# Patient Record
Sex: Female | Born: 1985 | Race: Black or African American | Hispanic: No | Marital: Married | Smoking: Former smoker
Health system: Southern US, Community
[De-identification: ages and names within clinical notes are randomized; demographics above are authoritative.]

## PROBLEM LIST (undated history)

## (undated) ENCOUNTER — Emergency Department (HOSPITAL_COMMUNITY): Admission: EM | Payer: No Typology Code available for payment source | Source: Home / Self Care

## (undated) DIAGNOSIS — F202 Catatonic schizophrenia: Secondary | ICD-10-CM

## (undated) DIAGNOSIS — R51 Headache: Secondary | ICD-10-CM

## (undated) DIAGNOSIS — F329 Major depressive disorder, single episode, unspecified: Secondary | ICD-10-CM

## (undated) DIAGNOSIS — J302 Other seasonal allergic rhinitis: Secondary | ICD-10-CM

## (undated) DIAGNOSIS — F32A Depression, unspecified: Secondary | ICD-10-CM

## (undated) DIAGNOSIS — L509 Urticaria, unspecified: Secondary | ICD-10-CM

## (undated) HISTORY — DX: Urticaria, unspecified: L50.9

## (undated) HISTORY — DX: Morbid (severe) obesity due to excess calories: E66.01

## (undated) HISTORY — PX: NO PAST SURGERIES: SHX2092

---
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
## 2005-04-29 ENCOUNTER — Emergency Department (HOSPITAL_COMMUNITY): Admission: EM | Admit: 2005-04-29 | Discharge: 2005-04-29 | Payer: Self-pay | Admitting: Emergency Medicine

## 2006-04-22 ENCOUNTER — Ambulatory Visit (HOSPITAL_COMMUNITY): Admission: RE | Admit: 2006-04-22 | Discharge: 2006-04-22 | Payer: Self-pay | Admitting: Family Medicine

## 2006-05-10 ENCOUNTER — Inpatient Hospital Stay (HOSPITAL_COMMUNITY): Admission: AD | Admit: 2006-05-10 | Discharge: 2006-05-10 | Payer: Self-pay | Admitting: Obstetrics

## 2006-08-18 ENCOUNTER — Inpatient Hospital Stay (HOSPITAL_COMMUNITY): Admission: AD | Admit: 2006-08-18 | Discharge: 2006-08-18 | Payer: Self-pay | Admitting: Obstetrics

## 2006-11-08 ENCOUNTER — Inpatient Hospital Stay (HOSPITAL_COMMUNITY): Admission: AD | Admit: 2006-11-08 | Discharge: 2006-11-11 | Payer: Self-pay | Admitting: Obstetrics

## 2006-11-13 ENCOUNTER — Inpatient Hospital Stay (HOSPITAL_COMMUNITY): Admission: AD | Admit: 2006-11-13 | Discharge: 2006-11-13 | Payer: Self-pay | Admitting: Obstetrics

## 2006-12-02 ENCOUNTER — Emergency Department (HOSPITAL_COMMUNITY): Admission: EM | Admit: 2006-12-02 | Discharge: 2006-12-02 | Payer: Self-pay | Admitting: Emergency Medicine

## 2007-06-30 ENCOUNTER — Emergency Department (HOSPITAL_COMMUNITY): Admission: EM | Admit: 2007-06-30 | Discharge: 2007-06-30 | Payer: Self-pay | Admitting: Emergency Medicine

## 2007-07-04 ENCOUNTER — Emergency Department (HOSPITAL_COMMUNITY): Admission: EM | Admit: 2007-07-04 | Discharge: 2007-07-04 | Payer: Self-pay | Admitting: Emergency Medicine

## 2008-08-30 ENCOUNTER — Encounter: Payer: Self-pay | Admitting: Family

## 2008-10-09 ENCOUNTER — Ambulatory Visit: Payer: Self-pay | Admitting: Diagnostic Radiology

## 2008-10-09 ENCOUNTER — Emergency Department (HOSPITAL_BASED_OUTPATIENT_CLINIC_OR_DEPARTMENT_OTHER): Admission: EM | Admit: 2008-10-09 | Discharge: 2008-10-09 | Payer: Self-pay | Admitting: Emergency Medicine

## 2009-04-15 ENCOUNTER — Ambulatory Visit: Payer: Self-pay | Admitting: Diagnostic Radiology

## 2009-04-15 ENCOUNTER — Emergency Department (HOSPITAL_COMMUNITY): Admission: EM | Admit: 2009-04-15 | Discharge: 2009-04-15 | Payer: Self-pay | Admitting: Emergency Medicine

## 2009-04-15 ENCOUNTER — Emergency Department (HOSPITAL_BASED_OUTPATIENT_CLINIC_OR_DEPARTMENT_OTHER): Admission: EM | Admit: 2009-04-15 | Discharge: 2009-04-15 | Payer: Self-pay | Admitting: Emergency Medicine

## 2009-12-28 ENCOUNTER — Emergency Department (HOSPITAL_BASED_OUTPATIENT_CLINIC_OR_DEPARTMENT_OTHER): Admission: EM | Admit: 2009-12-28 | Discharge: 2009-12-29 | Payer: Self-pay | Admitting: Emergency Medicine

## 2009-12-30 ENCOUNTER — Ambulatory Visit: Payer: Self-pay | Admitting: Radiology

## 2009-12-30 ENCOUNTER — Ambulatory Visit (HOSPITAL_BASED_OUTPATIENT_CLINIC_OR_DEPARTMENT_OTHER): Admission: RE | Admit: 2009-12-30 | Discharge: 2009-12-30 | Payer: Self-pay | Admitting: Emergency Medicine

## 2010-04-22 ENCOUNTER — Emergency Department (HOSPITAL_BASED_OUTPATIENT_CLINIC_OR_DEPARTMENT_OTHER)
Admission: EM | Admit: 2010-04-22 | Discharge: 2010-04-22 | Disposition: A | Discharge status: Home / Self Care | Payer: Self-pay | Attending: Emergency Medicine | Admitting: Emergency Medicine

## 2010-04-22 DIAGNOSIS — H538 Other visual disturbances: Secondary | ICD-10-CM | POA: Insufficient documentation

## 2010-04-22 DIAGNOSIS — J45909 Unspecified asthma, uncomplicated: Secondary | ICD-10-CM | POA: Insufficient documentation

## 2010-04-22 DIAGNOSIS — R51 Headache: Secondary | ICD-10-CM | POA: Insufficient documentation

## 2010-04-25 ENCOUNTER — Encounter: Payer: Self-pay | Admitting: Family

## 2010-04-25 ENCOUNTER — Ambulatory Visit (INDEPENDENT_AMBULATORY_CARE_PROVIDER_SITE_OTHER): Payer: Self-pay | Admitting: Family

## 2010-04-25 DIAGNOSIS — Z0189 Encounter for other specified special examinations: Secondary | ICD-10-CM

## 2010-04-25 DIAGNOSIS — G43019 Migraine without aura, intractable, without status migrainosus: Secondary | ICD-10-CM | POA: Insufficient documentation

## 2010-04-30 NOTE — Assessment & Plan Note (Signed)
Summary: new to est went to ed for headaches/mhf--rm 5   Vital Signs:  Patient profile:   25 year old female Height:      64 inches Weight:      210 pounds BMI:     36.18 Temp:     98.2 degrees F oral Pulse rate:   84 / minute Pulse rhythm:   regular Resp:     16 per minute BP sitting:   114 / 70  (right arm) Cuff size:   large  Vitals Entered By: Mervin Kung CMA Duncan Dull) (April 25, 2010 9:54 AM) CC: Pt states she has had a headache x 3 weeks on the left side of her head. Has noticed intermittent  blurred vision and nausea., Hypertension Management Is Patient Diabetic? No Pain Assessment Patient in pain? yes     Location: head Intensity: 5 Type: pressure Comments Has tried OTC headache meds with  minimal relief.   CC:  Pt states she has had a headache x 3 weeks on the left side of her head. Has noticed intermittent  blurred vision and nausea. and Hypertension Management.  History of Present Illness: Ms.  Debra Guerrero is a 25 year old female who presents today with chief complaint of HA on left side of head. Symptoms started 3 weeks ago and wax and wane in intensity. (Was seen in ED 2/14 with same complaint and referred to our office.) Symptoms were severe this AM. Somewhat improved at present (rates current pain 5/10) Symptoms are improved by a dark and quiet room and laying down. Symptoms are associated with nausea.  Vomitted x 1 several weeks ago Denies associated fever of neck pain.  Denies history of same. LMP about 1 month ago.   Had a primary care in [place]- [facility].  Was having gyn care provided there.  She is a full time Consulting civil engineer.    Hypertension History:      Negative major cardiovascular risk factors include female age less than 29 years old and non-tobacco-user status.     Preventive Screening-Counseling & Management  Alcohol-Tobacco     Alcohol drinks/day: 0     Smoking Status: never  Caffeine-Diet-Exercise     Caffeine use/day: 2      Does  Patient Exercise: no      Drug Use:  no.    Allergies (verified): No Known Drug Allergies  Past History:  Past Medical History: Asthma  Past Surgical History: none reported  Family History: mother-- living, alive and well father-- living; type II diabetes (diagnosed in his early 40's) 2 brothers-alive and well 1 sister- alive and well  1 daughter-- 2008 a & w  Social History: Occupation:  Engineer, technical sales, full time Landscape architect at [employer] Married Quit smoking 3 yrs ago Alcohol use-no Drug use-no Regular exercise-no Smoking Status:  never Caffeine use/day:  2  Does Patient Exercise:  no Drug Use:  no  Review of Systems       Constitutional: Denies Fever ENT:  Denies nasal congestion or sore throat. Resp: Denies cough CV:  Denies Chest Pain GI:  denies diarrhea GU: Denies dysuria Lymphatic: Denies lymphadenopathy Musculoskeletal:  Denies muscle/joint pain Skin:  Denies Rashes Psychiatric: Denies depression or anxiety Neuro: Denies numbness     Physical Exam  General:  Well-developed,well-nourished,in no acute distress; alert,appropriate and cooperative throughout examination Head:  Normocephalic and atraumatic without obvious abnormalities. No apparent alopecia or balding. Eyes:  PERRLA, sclear are clear Mouth:  Oral mucosa and oropharynx without  lesions or exudates.  Teeth in good repair. Neck:  No deformities, masses, or tenderness noted. Lungs:  Normal respiratory effort, chest expands symmetrically. Lungs are clear to auscultation, no crackles or wheezes. Heart:  Normal rate and regular rhythm. S1 and S2 normal without gallop, murmur, click, rub or other extra sounds. Neurologic:  alert & oriented X3, cranial nerves II-XII intact, strength normal in all extremities, and gait normal.   Psych:  Oriented X3 and normally interactive.     Impression & Recommendations:  Problem # 1:  MIGRAINE, COMMON, INTRACTABLE (ICD-346.11) Assessment  New Urine HCG today is negative.  Will treat pt with frova samples provided today: spo26 exp 01/2012 #6.  Pt to call if symptoms worsen, or if they do not improve.  Follow up in 1 month for CPX.  Her updated medication list for this problem includes:    Tylenol Extra Strength 500 Mg Tabs (Acetaminophen) .Marland Kitchen... 2 tablets every 5-6 hours.    Advil 200 Mg Tabs (Ibuprofen) .Marland Kitchen... 2 tablets every 6 hours.    Frova 2.5 Mg Tabs (Frovatriptan succinate) ..... One tablet by mouth at start of headache.  may repeat in 2 hours if ha is not resolved.  do not exceed 2 tabs in 1 day    Aleve 220 Mg Caps (Naproxen sodium) ..... One tablet by mouth every 12 hours as needed for headache.  Complete Medication List: 1)  Tylenol Extra Strength 500 Mg Tabs (Acetaminophen) .... 2 tablets every 5-6 hours. 2)  Advil 200 Mg Tabs (Ibuprofen) .... 2 tablets every 6 hours. 3)  [facility]  .... 2 tablets every 4-5 hours. 4)  Frova 2.5 Mg Tabs (Frovatriptan succinate) .... One tablet by mouth at start of headache.  may repeat in 2 hours if ha is not resolved.  do not exceed 2 tabs in 1 day 5)  Aleve 220 Mg Caps (Naproxen sodium) .... One tablet by mouth every 12 hours as needed for headache.  Hypertension Assessment/Plan:      The patient's hypertensive risk group is category A: No risk factors and no target organ damage.  Today's blood pressure is 114/70.    Patient Instructions: 1)  Please call if symptoms worsen, or if they do not improve. 2)  Please follow up in 1 month fasting for a complete physical with Pap smear. 3)  Please contact [facility] department to discuss coverage [phone]. Prescriptions: FROVA 2.5 MG TABS (FROVATRIPTAN SUCCINATE) one tablet by mouth at start of headache.  May repeat in 2 hours if HA is not resolved.  Do not exceed 2 tabs in 1 day  #6 x 0   Entered and Authorized by:   Lemont Fillers FNP   Signed by:   Lemont Fillers FNP on 04/25/2010   Method used:    Samples Given   RxID:   [id number]    Orders Added: 1)  New Patient Level II [99202]    Current Allergies (reviewed today): No known allergies    Appended Document: new to est went to ed for headaches/mhf--rm 5    Clinical Lists Changes  Orders: Added new Service order of Urine Pregnancy Test  [id number]) - Signed Observations: Added new observation of PREG TST URN: negative (04/25/2010 15:18)      Laboratory Results   Urine Tests      Urine HCG: negative

## 2010-05-02 ENCOUNTER — Encounter: Payer: Self-pay | Admitting: Family

## 2010-05-02 ENCOUNTER — Other Ambulatory Visit (HOSPITAL_COMMUNITY)
Admission: RE | Admit: 2010-05-02 | Discharge: 2010-05-02 | Disposition: A | Discharge status: Home / Self Care | Payer: Self-pay | Source: Ambulatory Visit | Attending: Internal Medicine | Admitting: Internal Medicine

## 2010-05-02 ENCOUNTER — Encounter (INDEPENDENT_AMBULATORY_CARE_PROVIDER_SITE_OTHER): Payer: Self-pay | Admitting: Family

## 2010-05-02 ENCOUNTER — Other Ambulatory Visit: Payer: Self-pay | Admitting: Family

## 2010-05-02 DIAGNOSIS — Z0000 Encounter for general adult medical examination without abnormal findings: Secondary | ICD-10-CM

## 2010-05-02 DIAGNOSIS — Z01419 Encounter for gynecological examination (general) (routine) without abnormal findings: Secondary | ICD-10-CM

## 2010-05-02 DIAGNOSIS — Z23 Encounter for immunization: Secondary | ICD-10-CM

## 2010-05-02 LAB — CONVERTED CEMR LAB: Glucose, Bld: 83 mg/dL (ref 70–99)

## 2010-05-04 ENCOUNTER — Encounter: Payer: Self-pay | Admitting: Family

## 2010-05-06 NOTE — Assessment & Plan Note (Signed)
Summary: CPE PATIENT FASTING/MHF--rm 5   Vital Signs:  Patient profile:   25 year old female Menstrual status:  regular LMP:     04/30/2010 Height:      64 inches Weight:      207 pounds BMI:     35.66 Temp:     97.9 degrees F oral Pulse rate:   78 / minute Pulse rhythm:   regular Resp:     16 per minute BP sitting:   102 / 70  (right arm) Cuff size:   large  Vitals Entered By: Mervin Kung CMA Duncan Dull) (May 02, 2010 9:01 AM) CC: Pt here for physical, fasting. Is Patient Diabetic? No Pain Assessment Patient in pain? no      LMP (date): 04/30/2010     Menstrual Status regular Enter LMP: 04/30/2010   CC:  Pt here for physical and fasting.Marland Kitchen  History of Present Illness: Preventative-   Exercise-  walks at school.  Diet is "up and down."  Occasional junk food.  Trying to eat more fruits.  Trying to renew her medicaid.  Last pap was 2009- reports that they have always been normal.  Migraine- noted good improvement with Frova.  Last migraine was on Migraine.  Preventive Screening-Counseling & Management  Alcohol-Tobacco     Alcohol drinks/day: 0     Smoking Status: never  Caffeine-Diet-Exercise     Caffeine use/day: 2      Does Patient Exercise: no  Allergies (verified): No Known Drug Allergies  Past History:  Past Medical History: Last updated: 04/25/2010 Asthma  Past Surgical History: Last updated: 04/25/2010 none reported  Family History: Reviewed history from 04/25/2010 and no changes required. mother-- living, alive and well father-- living; type II diabetes (diagnosed in his early 80's) 2 brothers-alive and well 1 sister- alive and well  1 daughter-- 2008 a & w  Social History: Reviewed history from 04/25/2010 and no changes required. 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 Resp: Denies cough CV:  Denies Chest  pain of sob GI:  Denies nausea or vomitting GU: Denies dysuria Lymphatic: Denies lymphadenopathy Musculoskeletal:  Denies muscle/joint pain Skin:  Denies Rashes Psychiatric: Denies depression or anxiety Neuro: Denies numbness     Physical Exam  General:  Overweight AA female, awake, alert and in NAD Head:  Normocephalic and atraumatic without obvious abnormalities. No apparent alopecia or balding. Eyes:  PERRLA, sclera clear Ears:  External ear exam shows no significant lesions or deformities.  Otoscopic examination reveals clear canals, tympanic membranes are intact bilaterally without bulging, retraction, inflammation or discharge. Hearing is grossly normal bilaterally. Nose:  External nasal examination shows no deformity or inflammation. Nasal mucosa are pink and moist without lesions or exudates. Mouth:  Oral mucosa and oropharynx without lesions or exudates.   Neck:  No deformities, masses, or tenderness noted. Chest Wall:  No deformities, masses, or tenderness noted. Breasts:  No mass, nodules, thickening, tenderness, bulging, retraction, inflamation, nipple discharge or skin changes noted.   Lungs:  Normal respiratory effort, chest expands symmetrically. Lungs are clear to auscultation, no crackles or wheezes. Heart:  Normal rate and regular rhythm. S1 and S2 normal without gallop, murmur, click, rub or other extra sounds. Abdomen:  Bowel sounds positive,abdomen soft and non-tender without masses, organomegaly or hernias  noted. Genitalia:  Pelvic Exam:        External: normal female genitalia without lesions or masses        Vagina: normal without lesions or masses        Cervix: normal without lesions or masses, anterior cervix- difficult pap        Adnexa: normal bimanual exam without masses or fullness        Uterus: normal by palpation        Pap smear: performed Msk:  No  deformity or scoliosis noted of thoracic or lumbar spine.   Pulses:  R and L carotid,radial,dorsalis pedis and posterior tibial pulses are full and equal bilaterally Extremities:  No clubbing, cyanosis, edema, or deformity noted with normal full range of motion of all joints.   Neurologic:  No cranial nerve deficits noted. Station and gait are normal. Plantar reflexes are down-going bilaterally. DTRs are symmetrical throughout. Sensory, motor and coordinative functions appear intact.alert & oriented X3, cranial nerves II-XII intact, and DTRs symmetrical and normal.   Skin:  Intact without suspicious lesions or rashes Cervical Nodes:  No lymphadenopathy noted Axillary Nodes:  No palpable lymphadenopathy Psych:  Cognition and judgment appear intact. Alert and cooperative with normal attention span and concentration. No apparent delusions, illusions, hallucinations   Impression & Recommendations:  Problem # 1:  Preventive Health Care (ICD-V70.0) Assessment Comment Only Patient was counseled on exercise, diet, and weight loss.  Pap perfomed today.  Tetanus today.  Will check fasting glucose due to family hx.  Pt wishes to defer further lab testing at this time pending medicaid cvg.   Complete Medication List: 1)  Frova 2.5 Mg Tabs (Frovatriptan succinate) .... One tablet by mouth at start of headache.  may repeat in 2 hours if ha is not resolved.  do not exceed 2 tabs in 1 day  Other Orders: Specimen Handling (96045) Pap Smear, Thin Prep ( Collection of) (W0981) T-Glucose, Blood ([id number])  Patient Instructions: 1)  We will contact you with the results of your Pap smear and sugar. 2)  Try to avoid juices and sodas. 3)  It is important that you exercise regularly at least 20 minutes 5 times a week. If you develop chest pain, have severe difficulty breathing, or feel very tired , stop exercising immediately and seek medical attention. 4)  Please complete your lab work on the first floor  today. 5)  Follow up in 1 year, sooner if problems or concerns.   Orders Added: 1)  Specimen Handling [99000] 2)  Pap Smear, Thin Prep ( Collection of) [Q0091] 3)  T-Glucose, Blood [[id number]] 4)  Est. Patient Level II [69629]    Current Allergies (reviewed today): No known allergies    Preventive Care Screening     Pt doesn't remember last tetanus. Nicki Guadalajara Fergerson CMA (AAMA)  May 02, 2010 9:08 AM     Appended Document: CPE PATIENT FASTING/MHF--rm 5    Clinical Lists Changes  Orders: Added new Service order of Tdap => 42yrs IM (52841) - Signed Added new Service order of Admin 1st Vaccine (32440) - Signed Observations: Added new observation of TD BOOST VIS: 01/25/08 version given May 02, 2010. (05/02/2010 17:13) Added new observation of TD BOOSTERLO: [id number] (05/02/2010 17:13) Added new observation of TD BOOST EXP: 12/27/2011 (05/02/2010 17:13) Added new observation of TD BOOSTERBY: Tricia Fergerson CMA (AAMA) (05/02/2010 17:13) Added new observation of TD BOOSTERRT: IM (05/02/2010 17:13) Added new observation of TDBOOSTERDSE: 0.5 ml (05/02/2010 17:13) Added new observation of  TD BOOSTERMF: GlaxoSmithKline (05/02/2010 17:13) Added new observation of TD BOOST SIT: right deltoid (05/02/2010 17:13) Added new observation of TD BOOSTER: Tdap (05/02/2010 17:13)       Immunizations Administered:  Tetanus Vaccine:    Vaccine Type: Tdap    Site: right deltoid    Mfr: GlaxoSmithKline    Dose: 0.5 ml    Route: IM    Given by: Mervin Kung CMA (AAMA)    Exp. Date: 12/27/2011    Lot #: [id number]    VIS given: 01/25/08 version given May 02, 2010.

## 2010-05-07 ENCOUNTER — Encounter: Payer: Self-pay | Admitting: Family

## 2010-05-13 ENCOUNTER — Telehealth: Payer: Self-pay | Admitting: Family

## 2010-05-15 NOTE — Assessment & Plan Note (Signed)
   [place] at [facility] [address] [place], [place]  [address]  [place] Phone: [phone]      May 04, 2010   [facility] [address] [place], [place] [address]  RE:  LAB RESULTS  Dear  Ms. [name],  The following is an interpretation of your most recent lab tests.  Please take note of any instructions provided or changes to medications that have resulted from your lab work. Your sugar is normal.   Sincerely Yours,    Lemont Fillers FNP  Appended Document:  Mailed.

## 2010-05-15 NOTE — Letter (Signed)
   [place] at [facility] [address] [place], [place]  [address]  [place] Phone: [phone]      May 07, 2010   [facility] [address] [place], [place] [address]  RE:  LAB RESULTS  Dear  Ms. [name],  The following is an interpretation of your most recent lab tests.  Please take note of any instructions provided or changes to medications that have resulted from your lab work.  Pap Smear: normal - follow up in 1 year   Sincerely Yours,    Lemont Fillers FNP  Appended Document:  Your sugar was also normal.  Appended Document:  mailed

## 2010-05-20 NOTE — Progress Notes (Signed)
Summary: records received  Phone Note Other Incoming   Summary of Call: Records received from [facility] and forwarded to Provider for review. Nicki Guadalajara Fergerson CMA Duncan Dull)  May 13, 2010 2:48 PM

## 2010-05-21 LAB — DIFFERENTIAL
Basophils Absolute: 0.1 10*3/uL (ref 0.0–0.1)
Basophils Relative: 1 % (ref 0–1)
Eosinophils Absolute: 0.3 10*3/uL (ref 0.0–0.7)
Eosinophils Relative: 2 % (ref 0–5)
Lymphocytes Relative: 28 % (ref 12–46)
Lymphs Abs: 2.9 10*3/uL (ref 0.7–4.0)
Monocytes Absolute: 0.7 10*3/uL (ref 0.1–1.0)
Monocytes Relative: 7 % (ref 3–12)
Neutro Abs: 6.7 10*3/uL (ref 1.7–7.7)
Neutrophils Relative %: 63 % (ref 43–77)

## 2010-05-21 LAB — COMPREHENSIVE METABOLIC PANEL
ALT: 11 U/L (ref 0–35)
AST: 21 U/L (ref 0–37)
Albumin: 3.8 g/dL (ref 3.5–5.2)
Alkaline Phosphatase: 90 U/L (ref 39–117)
GFR calc Af Amer: >60 mL/min (ref >60)
Glucose, Bld: 109 mg/dL — ABNORMAL HIGH (ref 70–99)
Potassium: 3.9 mEq/L (ref 3.5–5.1)
Sodium: 143 mEq/L (ref 135–145)
Total Protein: 6.9 g/dL (ref 6.0–8.3)

## 2010-05-21 LAB — CBC
HCT: 37.1 % (ref 36.0–46.0)
Hemoglobin: 12.1 g/dL (ref 12.0–15.0)
MCH: 28.2 pg (ref 26.0–34.0)
MCHC: 32.7 g/dL (ref 30.0–36.0)
MCV: 86.2 fL (ref 78.0–100.0)
Platelets: 237 10*3/uL (ref 150–400)
RBC: 4.31 MIL/uL (ref 3.87–5.11)
RDW: 12.2 % (ref 11.5–15.5)
WBC: 10.7 10*3/uL — ABNORMAL HIGH (ref 4.0–10.5)

## 2010-05-21 LAB — URINALYSIS, ROUTINE W REFLEX MICROSCOPIC
Bilirubin Urine: NEGATIVE
Glucose, UA: NEGATIVE mg/dL
Hgb urine dipstick: NEGATIVE
Ketones, ur: NEGATIVE mg/dL
Nitrite: NEGATIVE
Protein, ur: NEGATIVE mg/dL
Specific Gravity, Urine: 1.006 (ref 1.005–1.030)
Urobilinogen, UA: 0.2 mg/dL (ref 0.0–1.0)
pH: 7 (ref 5.0–8.0)

## 2010-05-21 LAB — PREGNANCY, URINE: Preg Test, Ur: NEGATIVE

## 2010-05-27 NOTE — Letter (Signed)
Summary: Records from [facility] 2009 - 2010  Records from [facility] 2009 - 2010   Imported By: Maryln Gottron 05/22/2010 15:07:02  _____________________________________________________________________  External Attachment:    Type:   Image     Comment:   External Document

## 2010-06-16 ENCOUNTER — Emergency Department (HOSPITAL_BASED_OUTPATIENT_CLINIC_OR_DEPARTMENT_OTHER)
Admission: EM | Admit: 2010-06-16 | Discharge: 2010-06-16 | Disposition: A | Discharge status: Home / Self Care | Payer: Self-pay | Attending: Emergency Medicine | Admitting: Emergency Medicine

## 2010-06-16 DIAGNOSIS — R0989 Other specified symptoms and signs involving the circulatory and respiratory systems: Secondary | ICD-10-CM | POA: Insufficient documentation

## 2010-06-16 DIAGNOSIS — R0609 Other forms of dyspnea: Secondary | ICD-10-CM | POA: Insufficient documentation

## 2010-06-16 DIAGNOSIS — J45909 Unspecified asthma, uncomplicated: Secondary | ICD-10-CM | POA: Insufficient documentation

## 2010-12-19 LAB — CBC
HCT: 32.6 — ABNORMAL LOW
HCT: 37.2
Hemoglobin: 12.2
MCHC: 32.8
MCHC: 34.5
MCV: 87.5
Platelets: 199
RBC: 4.29
RDW: 13.5

## 2010-12-19 LAB — CCBB MATERNAL DONOR DRAW

## 2010-12-19 LAB — RH IMMUNE GLOB WKUP(>/=20WKS)(NOT WOMEN'S HOSP)

## 2010-12-25 LAB — RH IMMUNE GLOBULIN WORKUP (NOT WOMEN'S HOSP)
ABO/RH(D): B NEG
Antibody Screen: NEGATIVE

## 2011-01-15 ENCOUNTER — Emergency Department (HOSPITAL_BASED_OUTPATIENT_CLINIC_OR_DEPARTMENT_OTHER)
Admission: EM | Admit: 2011-01-15 | Discharge: 2011-01-15 | Disposition: A | Discharge status: Home / Self Care | Payer: Self-pay | Attending: Emergency Medicine | Admitting: Emergency Medicine

## 2011-01-15 ENCOUNTER — Encounter: Payer: Self-pay | Admitting: Family Medicine

## 2011-01-15 ENCOUNTER — Emergency Department (INDEPENDENT_AMBULATORY_CARE_PROVIDER_SITE_OTHER): Payer: Self-pay

## 2011-01-15 DIAGNOSIS — J45909 Unspecified asthma, uncomplicated: Secondary | ICD-10-CM | POA: Insufficient documentation

## 2011-01-15 DIAGNOSIS — J329 Chronic sinusitis, unspecified: Secondary | ICD-10-CM | POA: Insufficient documentation

## 2011-01-15 DIAGNOSIS — R05 Cough: Secondary | ICD-10-CM

## 2011-01-15 DIAGNOSIS — R059 Cough, unspecified: Secondary | ICD-10-CM

## 2011-01-15 MED ORDER — AMOXICILLIN 500 MG PO CAPS: 500.0000 mg | ORAL_CAPSULE | Freq: Three times a day (TID) | ORAL | Status: AC

## 2011-01-15 NOTE — ED Notes (Signed)
Pt c/o "sinus pain and pressure" and cough x 1 day. Pt has h/o asthma and used neb at home with relief.

## 2011-01-15 NOTE — ED Provider Notes (Signed)
History     CSN: [id number] Arrival date & time: 01/15/2011  9:53 AM   First MD Initiated Contact with Patient 01/15/11 [phone]      Chief Complaint  Patient presents with  . Nasal Congestion    (Consider location/radiation/quality/duration/timing/severity/associated sxs/prior treatment) HPI Comments: History of asthma presenting with cough, congestion, sinus pressure for the past 2 days. Asthma is usually well-controlled and she has not had any wheezing or shortness of breath or chest pain. She's never been hospitalized for asthma. She reports rhinorrhea, cough productive of green mucous, pressure in her face and burning in her throat. She states the burning in her throat developed after using albuterol nebulizer at home. She denies any fever, nausea, vomiting, abdominal pain, chest pain or shortness of breath. She denies any sick contacts. She does not smoke.  The history is provided by the patient.    Past Medical History  Diagnosis Date  . Asthma     History reviewed. No pertinent past surgical history.  No family history on file.  History  Substance Use Topics  . Smoking status: Never Smoker   . Smokeless tobacco: Not on file  . Alcohol Use: No    OB History    Grav Para Term Preterm Abortions TAB SAB Ect Mult Living                  Review of Systems  All other systems reviewed and are negative.    Allergies  Review of patient's allergies indicates no known allergies.  Home Medications   Current Outpatient Rx  Name Route Sig Dispense Refill  . ALBUTEROL SULFATE (2.5 MG/3ML) 0.083% IN NEBU Nebulization Take 2.5 mg by nebulization every 6 (six) hours as needed.      . AMOXICILLIN 500 MG PO CAPS Oral Take 1 capsule (500 mg total) by mouth 3 (three) times daily. 30 capsule 0    BP 128/74  Pulse 79  Temp(Src) 98.2 F (36.8 C) (Oral)  Resp 18  Ht 5\' 4"  (1.626 m)  Wt 198 lb (89.812 kg)  BMI 33.99 kg/m2  SpO2 99%  LMP 12/07/2010  Physical Exam    Constitutional: She is oriented to person, place, and time. She appears well-developed and well-nourished. No distress.  HENT:  Head: Normocephalic and atraumatic.  Right Ear: External ear normal.  Left Ear: External ear normal.  Mouth/Throat: Oropharynx is clear and moist. No oropharyngeal exudate.       Bilateral maxillary sinus pain  Eyes: Conjunctivae are normal. Pupils are equal, round, and reactive to light.  Neck: Normal range of motion. Neck supple.       No meningismus  Cardiovascular: Normal rate, regular rhythm and normal heart sounds.   Pulmonary/Chest: Breath sounds normal. No respiratory distress. She has no wheezes.  Abdominal: Soft. There is no tenderness. There is no rebound and no guarding.  Musculoskeletal: Normal range of motion. She exhibits no edema and no tenderness.  Neurological: She is alert and oriented to person, place, and time. No cranial nerve deficit.  Skin: Skin is warm.    ED Course  Procedures (including critical care time)   Labs Reviewed  RAPID STREP SCREEN   Dg Chest 2 View  01/15/2011  *RADIOLOGY REPORT*  Clinical Data: 25 year old with cough.  CHEST - 2 VIEW  Comparison: 04/15/2009  Findings: Two views of the chest demonstrate clear lungs. Heart and mediastinum are within normal limits.  The trachea is midline. Bony structures are intact.  IMPRESSION: Normal chest examination.  Original Report Authenticated By: Richarda Overlie, M.D.     1. Sinusitis       MDM  Cough, congestion, sinus pressure and pain. Nontoxic appearance and afebrile.  Chest x-ray negative for infiltrate. We'll treat empirically for sinusitis. Over-the-counter decongestants, antipyretics, follow up with primary doctor.       Glynn Octave, MD 01/15/11 1308

## 2011-04-07 ENCOUNTER — Encounter (HOSPITAL_BASED_OUTPATIENT_CLINIC_OR_DEPARTMENT_OTHER): Payer: Self-pay | Admitting: *Deleted

## 2011-04-07 ENCOUNTER — Emergency Department (HOSPITAL_BASED_OUTPATIENT_CLINIC_OR_DEPARTMENT_OTHER)
Admission: EM | Admit: 2011-04-07 | Discharge: 2011-04-07 | Disposition: A | Discharge status: Home / Self Care | Payer: No Typology Code available for payment source | Attending: Emergency Medicine | Admitting: Emergency Medicine

## 2011-04-07 ENCOUNTER — Emergency Department (INDEPENDENT_AMBULATORY_CARE_PROVIDER_SITE_OTHER): Payer: No Typology Code available for payment source

## 2011-04-07 DIAGNOSIS — M549 Dorsalgia, unspecified: Secondary | ICD-10-CM

## 2011-04-07 DIAGNOSIS — S39012A Strain of muscle, fascia and tendon of lower back, initial encounter: Secondary | ICD-10-CM

## 2011-04-07 DIAGNOSIS — S8000XA Contusion of unspecified knee, initial encounter: Secondary | ICD-10-CM | POA: Insufficient documentation

## 2011-04-07 DIAGNOSIS — M25569 Pain in unspecified knee: Secondary | ICD-10-CM

## 2011-04-07 DIAGNOSIS — IMO0002 Reserved for concepts with insufficient information to code with codable children: Secondary | ICD-10-CM | POA: Insufficient documentation

## 2011-04-07 DIAGNOSIS — Y92410 Unspecified street and highway as the place of occurrence of the external cause: Secondary | ICD-10-CM | POA: Insufficient documentation

## 2011-04-07 MED ORDER — CYCLOBENZAPRINE HCL 10 MG PO TABS: 10.0000 mg | ORAL_TABLET | Freq: Two times a day (BID) | ORAL | Status: AC | PRN

## 2011-04-07 MED ORDER — HYDROCODONE-ACETAMINOPHEN 5-325 MG PO TABS: 2.0000 | ORAL_TABLET | ORAL | Status: AC | PRN
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
## 2011-08-24 ENCOUNTER — Encounter (HOSPITAL_BASED_OUTPATIENT_CLINIC_OR_DEPARTMENT_OTHER): Payer: Self-pay | Admitting: Family Medicine

## 2011-08-24 ENCOUNTER — Emergency Department (HOSPITAL_BASED_OUTPATIENT_CLINIC_OR_DEPARTMENT_OTHER)
Admission: EM | Admit: 2011-08-24 | Discharge: 2011-08-24 | Disposition: A | Discharge status: Home / Self Care | Payer: Medicaid Other | Attending: Emergency Medicine | Admitting: Emergency Medicine

## 2011-08-24 DIAGNOSIS — G43909 Migraine, unspecified, not intractable, without status migrainosus: Secondary | ICD-10-CM

## 2011-08-24 DIAGNOSIS — J45909 Unspecified asthma, uncomplicated: Secondary | ICD-10-CM | POA: Insufficient documentation

## 2011-08-24 DIAGNOSIS — Z79899 Other long term (current) drug therapy: Secondary | ICD-10-CM | POA: Insufficient documentation

## 2011-08-24 HISTORY — DX: Headache: R51

## 2011-08-24 MED ORDER — METOCLOPRAMIDE HCL 5 MG/ML IJ SOLN
10.0000 mg | Freq: Once | INTRAMUSCULAR | Status: AC
  Administered 2011-08-24: 10 mg via INTRAVENOUS
  Filled 2011-08-24: qty 2

## 2011-08-24 MED ORDER — SODIUM CHLORIDE 0.9 % IV BOLUS (SEPSIS)
1000.0000 mL | Freq: Once | INTRAVENOUS | Status: AC
  Administered 2011-08-24: 1000 mL via INTRAVENOUS

## 2011-08-24 MED ORDER — DIPHENHYDRAMINE HCL 50 MG/ML IJ SOLN
12.5000 mg | Freq: Once | INTRAMUSCULAR | Status: AC
  Administered 2011-08-24: 12.5 mg via INTRAVENOUS
  Filled 2011-08-24: qty 1

## 2011-08-24 MED ORDER — DEXAMETHASONE SODIUM PHOSPHATE 10 MG/ML IJ SOLN
10.0000 mg | Freq: Once | INTRAMUSCULAR | Status: AC
  Administered 2011-08-24: 10 mg via INTRAVENOUS
  Filled 2011-08-24: qty 1

## 2011-08-24 NOTE — ED Notes (Addendum)
Pt c/o right sided headache with right jaw pain and right lateral neck pain x 1 wk. Pt reports h/o same but is not currently on meds. Pt has seen Sandford Craze in past for H/A. Pt sts she has taken multiple otc meds without success. Pt drove self to ED.

## 2011-08-24 NOTE — ED Provider Notes (Signed)
History     CSN: [id number]  Arrival date & time 08/24/11  4696   First MD Initiated Contact with Patient 08/24/11 [phone]      Chief Complaint  Patient presents with  . Headache  . Jaw Pain    (Consider location/radiation/quality/duration/timing/severity/associated sxs/prior treatment) HPI Patient is a 26 year old female who presents today complaining of bilateral frontal headache for the past 7 days. This radiates to her jaw. Patient endorses associated photophobia as well as nausea. She denies fevers, vomiting, rash, or neck pain. Patient has history of prior migraines and reports that this feels similarly. She denies any other symptoms today. Pain is rated as a 10 out of 10 and sharp. She has tried over-the-counter medications without any relief. There are no other associated or modifying factors. Past Medical History  Diagnosis Date  . Asthma   . Headache     History reviewed. No pertinent past surgical history.  History reviewed. No pertinent family history.  History  Substance Use Topics  . Smoking status: Never Smoker   . Smokeless tobacco: Not on file  . Alcohol Use: No    OB History    Grav Para Term Preterm Abortions TAB SAB Ect Mult Living                  Review of Systems  Constitutional: Negative.   Eyes: Positive for photophobia.  Respiratory: Negative.   Cardiovascular: Negative.   Gastrointestinal: Positive for nausea.  Genitourinary: Negative.   Musculoskeletal: Negative.   Neurological: Positive for headaches.  Hematological: Negative.   Psychiatric/Behavioral: Negative.   All other systems reviewed and are negative.    Allergies  Review of patient's allergies indicates no known allergies.  Home Medications   Current Outpatient Rx  Name Route Sig Dispense Refill  . ALBUTEROL SULFATE (2.5 MG/3ML) 0.083% IN NEBU Nebulization Take 2.5 mg by nebulization every 6 (six) hours as needed.        BP 120/71  Pulse 78  Temp 97.7 F (36.5 C)  (Oral)  Resp 18  Ht 5\' 4"  (1.626 m)  Wt 197 lb 6 oz (89.529 kg)  BMI 33.88 kg/m2  SpO2 100%  LMP 08/17/2011  Physical Exam  Nursing note and vitals reviewed. GEN: Well-developed, well-nourished female in no distress HEENT: Atraumatic, normocephalic. Oropharynx clear without erythema EYES: PERRLA BL, no scleral icterus. NECK: Trachea midline, no meningismus CV: regular rate and rhythm. No murmurs, rubs, or gallops PULM: No respiratory distress.  No crackles, wheezes, or rales. GI: soft, non-tender. No guarding, rebound, or tenderness. + bowel sounds  GU: deferred Neuro: cranial nerves grossly 2-12 intact, no abnormalities of strength or sensation, A and O x 3 MSK: Patient moves all 4 extremities symmetrically, no deformity, edema, or injury noted Skin: No rashes petechiae, purpura, or jaundice Psych: no abnormality of mood   ED Course  Procedures (including critical care time)  Labs Reviewed - No data to display No results found.   1. Migraine       MDM  Patient was evaluated by myself. She was having her typical migraine. She had no history of trauma, neck pain, rashes, fever, or other symptoms to indicate other intracranial pathology such as subarachnoid hemorrhage or mass. Patient did not have presentation consistent with meningitis. Patient was treated as a migraine with Reglan and Benadryl. Patient also received IV fluids her symptoms completely resolved. She was given a dose of Decadron 10 mg IV to prevent recurrence. Following this patient was feeling completely better  and was discharged home in good condition.        Cyndra Numbers, MD 08/24/11 1059

## 2011-09-09 ENCOUNTER — Emergency Department (HOSPITAL_COMMUNITY): Payer: Medicaid Other

## 2011-09-09 ENCOUNTER — Encounter (HOSPITAL_COMMUNITY): Payer: Self-pay | Admitting: Emergency Medicine

## 2011-09-09 ENCOUNTER — Emergency Department (HOSPITAL_COMMUNITY)
Admission: EM | Admit: 2011-09-09 | Discharge: 2011-09-10 | Disposition: A | Discharge status: Other Acute Inpatient Hospital | Payer: Medicaid Other | Attending: Emergency Medicine | Admitting: Emergency Medicine

## 2011-09-09 DIAGNOSIS — R45851 Suicidal ideations: Secondary | ICD-10-CM | POA: Insufficient documentation

## 2011-09-09 DIAGNOSIS — Z87891 Personal history of nicotine dependence: Secondary | ICD-10-CM | POA: Insufficient documentation

## 2011-09-09 DIAGNOSIS — J45909 Unspecified asthma, uncomplicated: Secondary | ICD-10-CM | POA: Insufficient documentation

## 2011-09-09 DIAGNOSIS — F32A Depression, unspecified: Secondary | ICD-10-CM

## 2011-09-09 DIAGNOSIS — R51 Headache: Secondary | ICD-10-CM | POA: Insufficient documentation

## 2011-09-09 DIAGNOSIS — Z79899 Other long term (current) drug therapy: Secondary | ICD-10-CM | POA: Insufficient documentation

## 2011-09-09 DIAGNOSIS — F329 Major depressive disorder, single episode, unspecified: Secondary | ICD-10-CM | POA: Insufficient documentation

## 2011-09-09 DIAGNOSIS — F3289 Other specified depressive episodes: Secondary | ICD-10-CM | POA: Insufficient documentation

## 2011-09-09 HISTORY — DX: Depression, unspecified: F32.A

## 2011-09-09 HISTORY — DX: Major depressive disorder, single episode, unspecified: F32.9

## 2011-09-09 LAB — COMPREHENSIVE METABOLIC PANEL
ALT: 15 U/L (ref 0–35)
CO2: 23 mEq/L (ref 19–32)
Calcium: 9.8 mg/dL (ref 8.4–10.5)
Creatinine, Ser: 0.59 mg/dL (ref 0.50–1.10)
GFR calc Af Amer: >90 mL/min (ref >90)
GFR calc non Af Amer: >90 mL/min (ref >90)
Glucose, Bld: 91 mg/dL (ref 70–99)
Total Bilirubin: 0.5 mg/dL (ref 0.3–1.2)

## 2011-09-09 LAB — CBC
Hemoglobin: 15 g/dL (ref 12.0–15.0)
MCH: 28.6 pg (ref 26.0–34.0)
MCV: 83.8 fL (ref 78.0–100.0)
RBC: 5.25 MIL/uL — ABNORMAL HIGH (ref 3.87–5.11)

## 2011-09-09 LAB — RAPID URINE DRUG SCREEN, HOSP PERFORMED
Barbiturates: NOT DETECTED
Cocaine: NOT DETECTED
Opiates: NOT DETECTED

## 2011-09-09 MED ORDER — NICOTINE 21 MG/24HR TD PT24: 21.0000 mg | MEDICATED_PATCH | Freq: Every day | TRANSDERMAL | Status: DC

## 2011-09-09 MED ORDER — ACETAMINOPHEN 325 MG PO TABS: 650.0000 mg | ORAL_TABLET | ORAL | Status: DC | PRN

## 2011-09-09 MED ORDER — ALUM & MAG HYDROXIDE-SIMETH 200-200-20 MG/5ML PO SUSP: 30.0000 mL | ORAL | Status: DC | PRN

## 2011-09-09 MED ORDER — IBUPROFEN 600 MG PO TABS: 600.0000 mg | ORAL_TABLET | Freq: Three times a day (TID) | ORAL | Status: DC | PRN

## 2011-09-09 MED ORDER — ZOLPIDEM TARTRATE 5 MG PO TABS: 5.0000 mg | ORAL_TABLET | Freq: Every evening | ORAL | Status: DC | PRN

## 2011-09-09 MED ORDER — ONDANSETRON HCL 4 MG PO TABS: 4.0000 mg | ORAL_TABLET | Freq: Three times a day (TID) | ORAL | Status: DC | PRN

## 2011-09-09 NOTE — ED Provider Notes (Signed)
History     CSN: [id number]  Arrival date & time 09/09/11  1207   First MD Initiated Contact with Patient 09/09/11 1209      Chief Complaint  Patient presents with  . Depression    (Consider location/radiation/quality/duration/timing/severity/associated sxs/prior treatment) HPI  Pt Status: Pt is IVC'd  Patient to the ER with complaints of depression. The depression started when she developed headaches her software of college. The patient and her mother both deny an event that caused the acute start of her headaches and depression. The patient has a lot affect and answers most of my questions as I don't know. The patient has started not to cares much about her hygiene. She started to withdraw from school. The patient's mother states that the patient has expressed some suicidal ideation without plan. The patient does not answer whether she is suicidal or not. The counselor that she sees a [employer] [employer] recommends the patient had a head CT to rule out medical reasons for depression. At this time the patient is in no acute distress and is a voluntary.  Past Medical History  Diagnosis Date  . Asthma   . Headache   . Depression     No past surgical history on file.  No family history on file.  History  Substance Use Topics  . Smoking status: Former Games developer  . Smokeless tobacco: Not on file  . Alcohol Use: No    OB History    Grav Para Term Preterm Abortions TAB SAB Ect Mult Living                  Review of Systems   HEENT: denies blurry vision or change in hearing PULMONARY: Denies difficulty breathing and SOB CARDIAC: denies chest pain or heart palpitations MUSCULOSKELETAL:  denies being unable to ambulate ABDOMEN AL: denies abdominal pain GU: denies loss of bowel or urinary control NEURO: denies numbness and tingling in extremities SKIN: no new rashes PSYCH: patient having depression NECK: Pt denies having neck pain     Allergies  Review of patient's allergies  indicates no known allergies.  Home Medications   Current Outpatient Rx  Name Route Sig Dispense Refill  . ALBUTEROL SULFATE HFA 108 (90 BASE) MCG/ACT IN AERS Inhalation Inhale 2 puffs into the lungs every 6 (six) hours as needed. Shortness of breath    . ALBUTEROL SULFATE (2.5 MG/3ML) 0.083% IN NEBU Nebulization Take 2.5 mg by nebulization every 6 (six) hours as needed.        BP 150/113  Pulse 115  Temp 98.2 F (36.8 C) (Oral)  Resp 20  Ht 5\' 5"  (1.651 m)  Wt 181 lb 12.8 oz (82.464 kg)  BMI 30.25 kg/m2  SpO2 100%  LMP 09/09/2011  Physical Exam  Nursing note and vitals reviewed. Constitutional: She appears well-developed and well-nourished. No distress.  HENT:  Head: Normocephalic and atraumatic.  Eyes: Pupils are equal, round, and reactive to light.  Neck: Normal range of motion. Neck supple.  Cardiovascular: Normal rate and regular rhythm.   Pulmonary/Chest: Effort normal.  Abdominal: Soft.  Neurological: She is alert.  Skin: Skin is warm and dry.  Psychiatric: She expresses no homicidal and no suicidal ideation. She expresses no suicidal plans and no homicidal plans.       Pt has flat affect.    ED Course  Procedures (including critical care time)   Labs Reviewed  CBC  COMPREHENSIVE METABOLIC PANEL  PREGNANCY, URINE  URINE RAPID DRUG SCREEN (HOSP PERFORMED)  ETHANOL   No results found.   1. Depression       MDM  After patient cleared, hopefully she will go to [place] with passive suicidal ideation.  Normal head CT, pt tried to leave ER and she is believed to be a harm to herself. IVC paper filled.        Dorthula Matas, PA 09/09/11 1336

## 2011-09-09 NOTE — BH Assessment (Addendum)
Assessment Note   Debra Guerrero is a 26 y.o. female brought in by family, endorsing SI with no plan. Pt currently denies SI, HI, AHVH, and SA. Pt endorses depressive symptoms including decreased sleep, appetite, and concentration. She also endorses anhedonia, fatigue, and has a flat affect. Pt appears highly suspicious and answered most assessment with questions "no" and "I don't know." Pt currently lives with her family. She states she was in school and work until a "couple of months ago" when she withdrew and quit. She reports no recent stressor. Family reports pt has stopped taking care of hygiene and has been isolating herself. Family reports concern that pt is a danger to herself. Pt has no prior mental health history and is on no medications. She was orginally seen at [facility] who sent pt to [facility] due to pt having a history of migraines. Pt is under IVC petition.   Pt has been seen by telepsychatrist who recommend inpatient treatment at this time.  Axis I: Major Depression, single episode Axis II: Deferred Axis III:  Past Medical History  Diagnosis Date  . Asthma   . Headache   . Depression    Axis IV: other psychosocial or environmental problems Axis V: 21-30 behavior considerably influenced by delusions or hallucinations OR serious impairment in judgment, communication OR inability to function in almost all areas  Past Medical History:  Past Medical History  Diagnosis Date  . Asthma   . Headache   . Depression     No past surgical history on file.  Family History: No family history on file.  Social History:  reports that she has quit smoking. She does not have any smokeless tobacco history on file. She reports that she does not drink alcohol or use illicit drugs.  Additional Social History:  Alcohol / Drug Use History of alcohol / drug use?: No history of alcohol / drug abuse  CIWA: CIWA-Ar BP: 121/87 mmHg Pulse Rate: 107  COWS:    Allergies: No Known  Allergies  Home Medications:  (Not in a hospital admission)  OB/GYN Status:  Patient's last menstrual period was 09/09/2011.  General Assessment Data Location of Assessment: [facility] ED Living Arrangements: Parent Can pt return to current living arrangement?: Yes Admission Status: Involuntary Is patient capable of signing voluntary admission?: Yes Transfer from: Acute Hospital Referral Source: Other Museum/gallery curator)  Education Status Is patient currently in school?: No Highest grade of school patient has completed: 2 years of college  Risk to self Suicidal Ideation: No-Not Currently/Within Last 6 Months Suicidal Intent: No-Not Currently/Within Last 6 Months Is patient at risk for suicide?: Yes Suicidal Plan?: No Access to Means: No What has been your use of drugs/alcohol within the last 12 months?: denies Previous Attempts/Gestures: No How many times?: 0  Other Self Harm Risks: none Triggers for Past Attempts: None known Intentional Self Injurious Behavior: None Family Suicide History: No Recent stressful life event(s):  (none noted) Persecutory voices/beliefs?: No Depression: Yes Depression Symptoms: Despondent;Insomnia;Isolating;Loss of interest in usual pleasures Substance abuse history and/or treatment for substance abuse?: No Suicide prevention information given to non-admitted patients: Not applicable  Risk to Others Homicidal Ideation: No Thoughts of Harm to Others: No Current Homicidal Intent: No Current Homicidal Plan: No Access to Homicidal Means: No Identified Victim: none History of harm to others?: No Assessment of Violence: None Noted Violent Behavior Description: cooperative Does patient have access to weapons?: No Criminal Charges Pending?: No Does patient have a court date: No  Psychosis Hallucinations:  None noted Delusions: None noted  Mental Status Report Appear/Hygiene: Bizarre Eye Contact: Poor Motor Activity: Unremarkable Speech:  Logical/coherent Level of Consciousness: Alert Mood: Suspicious;Depressed;Anxious Affect: Apprehensive;Anxious;Depressed Anxiety Level: Moderate Thought Processes: Relevant;Coherent Judgement: Impaired Orientation: Person;Place;Time;Situation Obsessive Compulsive Thoughts/Behaviors: None  Cognitive Functioning Concentration: Normal Memory: Recent Intact;Remote Intact IQ: Average Insight: Poor Impulse Control: Poor Appetite: Poor Weight Loss: 0  Weight Gain: 0  Sleep: Decreased Vegetative Symptoms: None  ADLScreening [facility] Assessment Services) Patient's cognitive ability adequate to safely complete daily activities?: Yes Patient able to express need for assistance with ADLs?: Yes Independently performs ADLs?: Yes  Abuse/Neglect [facility]) Physical Abuse: Denies Verbal Abuse: Denies Sexual Abuse: Denies  Prior Inpatient Therapy Prior Inpatient Therapy: No Prior Therapy Dates: na Prior Therapy Facilty/Provider(s): na Reason for Treatment: na  Prior Outpatient Therapy Prior Outpatient Therapy: No Prior Therapy Dates: na Prior Therapy Facilty/Provider(s): na Reason for Treatment: na  ADL Screening (condition at time of admission) Patient's cognitive ability adequate to safely complete daily activities?: Yes Patient able to express need for assistance with ADLs?: Yes Independently performs ADLs?: Yes  Home Assistive Devices/Equipment Home Assistive Devices/Equipment: None    Abuse/Neglect Assessment (Assessment to be complete while patient is alone) Physical Abuse: Denies Verbal Abuse: Denies Sexual Abuse: Denies Exploitation of patient/patient's resources: Denies Self-Neglect: Denies Values / Beliefs Cultural Requests During Hospitalization: None Spiritual Requests During Hospitalization: None   Advance Directives (For Healthcare) Advance Directive: Patient does not have advance directive;Patient would not like information Pre-existing out of facility DNR order  (yellow form or pink MOST form): No Nutrition Screen Diet: Regular Unintentional weight loss greater than 10lbs within the last month: No Problems chewing or swallowing foods and/or liquids: No Home Tube Feeding or Total Parenteral Nutrition (TPN): No Patient appears severely malnourished: No Pregnant or Lactating: No  Additional Information 1:1 In Past 12 Months?: No CIRT Risk: No Elopement Risk: No Does patient have medical clearance?: Yes     Disposition:  Disposition Disposition of Patient: Referred to;Inpatient treatment program Type of inpatient treatment program: Adult Pt has been accepted to [facility] by Jorje Guild, PA to Dr. Dan Humphreys, rm [phone]. EDP notified and agrees with plan.  On Site Evaluation by:   Reviewed with Physician:     Georgina Quint A 09/09/2011 10:35 PM

## 2011-09-09 NOTE — ED Provider Notes (Signed)
History     CSN: [id number]  Arrival date & time 09/09/11  1207   First MD Initiated Contact with Patient 09/09/11 1209      Chief Complaint  Patient presents with  . Depression    (Consider location/radiation/quality/duration/timing/severity/associated sxs/prior treatment) HPI  Past Medical History  Diagnosis Date  . Asthma   . Headache   . Depression     No past surgical history on file.  No family history on file.  History  Substance Use Topics  . Smoking status: Former Games developer  . Smokeless tobacco: Not on file  . Alcohol Use: No    OB History    Grav Para Term Preterm Abortions TAB SAB Ect Mult Living                  Review of Systems  Allergies  Review of patient's allergies indicates no known allergies.  Home Medications   Current Outpatient Rx  Name Route Sig Dispense Refill  . ALBUTEROL SULFATE HFA 108 (90 BASE) MCG/ACT IN AERS Inhalation Inhale 2 puffs into the lungs every 6 (six) hours as needed. Shortness of breath    . ALBUTEROL SULFATE (2.5 MG/3ML) 0.083% IN NEBU Nebulization Take 2.5 mg by nebulization every 6 (six) hours as needed.        BP 150/113  Pulse 115  Temp 98.2 F (36.8 C) (Oral)  Resp 20  Ht 5\' 5"  (1.651 m)  Wt 181 lb 12.8 oz (82.464 kg)  BMI 30.25 kg/m2  SpO2 100%  LMP 09/09/2011  Physical Exam  ED Course  Procedures (including critical care time)  Labs Reviewed  CBC - Abnormal; Notable for the following:    WBC 12.9 (*)     RBC 5.25 (*)     All other components within normal limits  COMPREHENSIVE METABOLIC PANEL  ETHANOL  PREGNANCY, URINE  URINE RAPID DRUG SCREEN (HOSP PERFORMED)   Ct Head Wo Contrast  09/09/2011  *RADIOLOGY REPORT*  Clinical Data: Depression and headaches.  CT HEAD WITHOUT CONTRAST  Technique:  Contiguous axial images were obtained from the base of the skull through the vertex without contrast.  Comparison: None.  Findings: No acute intracranial abnormality is present. Specifically, there is  no evidence for acute infarct, hemorrhage, mass, hydrocephalus, or extra-axial fluid collection.  The paranasal sinuses and mastoid air cells are clear.  The globes and orbits are intact.  The osseous skull is intact.  IMPRESSION: Negative CT of the head.  Original Report Authenticated By: Jamesetta Orleans. MATTERN, M.D.     1. Depression       MDM  Medical screening examination/treatment/procedure(s) were conducted as a shared visit with non-physician practitioner(s) and myself.  I personally evaluated the patient during the encounter Pt with depression, si. Appears depressed. Act and telepsych eval.         Suzi Roots, MD 09/09/11 1455

## 2011-09-09 NOTE — ED Notes (Signed)
Patient brought to room 40. Patient immediately walked to doors of the psych ED unit and tried to go through. Patient stood with security by the doors and crossed her arms. Patient was asked by writer to go to her room for assessment. Patient was cooperative. Patient was instructed on telephone calls x 3 and lasting 5 minutes a piece. Patient nodded that she understood. Patient was given ginge rale upon request.

## 2011-09-09 NOTE — ED Notes (Addendum)
Patient refusing to take underwear off. Patient states she is on her menstrual. Patient given mesh panties and pad. Patient taken to restroom. Patient states "Im not doing all this. You people are gross. Im not doing this" Patient then threw items in bag and pushed passed tech. Security at door. Mom with patient. Mesh undies and pad at bedside. RN notified

## 2011-09-09 NOTE — ED Notes (Signed)
Patient aware of need for urine testing. Patient unable to void at this time. Patient encouraged to call for toileting assistance Patient has labeled cup.

## 2011-09-09 NOTE — ED Provider Notes (Signed)
Medical screening examination/treatment/procedure(s) were conducted as a shared visit with non-physician practitioner(s) and myself.  I personally evaluated the patient during the encounter   Suzi Roots, MD 09/09/11 1539

## 2011-09-09 NOTE — ED Notes (Addendum)
Pt stated that depression in college since sophomore. Pt stated that suicidal ideations are recurrent without a plan. Pt stated that her mother made her go to Psychiatry and counseling to get help, because she had an decrease of energy and interest in things. Pt never attempted suicide and is negative for hx of cutting and self inflicting injury. No hx of alcohol and drug abuse.  Affect is flat and depressed. Mother at bedside. Mother stated no family hx of mental health.  Mother stated that pt started having headaches a month ago and shortly after headaches pt withdrew from school and is withdrawn, poor hygiene per mother and not taking care of her daughter.  Counselor at [employer] suggested CT head to mother at hospital to exclude medical reasons for depression

## 2011-09-09 NOTE — ED Notes (Signed)
Pt states she does not know why she is here.  When I asked her if she was suicidal she stated "earlier but not now".

## 2011-09-10 ENCOUNTER — Encounter (HOSPITAL_COMMUNITY): Payer: Self-pay | Admitting: *Deleted

## 2011-09-10 ENCOUNTER — Inpatient Hospital Stay (HOSPITAL_COMMUNITY)
Admission: EM | Admit: 2011-09-10 | Discharge: 2011-09-11 | DRG: 885 | Disposition: A | Discharge status: Other Acute Inpatient Hospital | Payer: Medicaid Other | Source: Ambulatory Visit | Attending: Emergency Medicine | Admitting: Emergency Medicine

## 2011-09-10 DIAGNOSIS — F329 Major depressive disorder, single episode, unspecified: Secondary | ICD-10-CM | POA: Diagnosis present

## 2011-09-10 DIAGNOSIS — I498 Other specified cardiac arrhythmias: Secondary | ICD-10-CM | POA: Diagnosis present

## 2011-09-10 DIAGNOSIS — J45909 Unspecified asthma, uncomplicated: Secondary | ICD-10-CM | POA: Diagnosis present

## 2011-09-10 DIAGNOSIS — F202 Catatonic schizophrenia: Secondary | ICD-10-CM

## 2011-09-10 DIAGNOSIS — G9349 Other encephalopathy: Secondary | ICD-10-CM | POA: Diagnosis present

## 2011-09-10 DIAGNOSIS — E876 Hypokalemia: Secondary | ICD-10-CM | POA: Diagnosis present

## 2011-09-10 DIAGNOSIS — G43019 Migraine without aura, intractable, without status migrainosus: Secondary | ICD-10-CM | POA: Diagnosis present

## 2011-09-10 DIAGNOSIS — Z79899 Other long term (current) drug therapy: Secondary | ICD-10-CM

## 2011-09-10 DIAGNOSIS — G9340 Encephalopathy, unspecified: Secondary | ICD-10-CM | POA: Diagnosis present

## 2011-09-10 DIAGNOSIS — F200 Paranoid schizophrenia: Secondary | ICD-10-CM

## 2011-09-10 DIAGNOSIS — J3089 Other allergic rhinitis: Secondary | ICD-10-CM | POA: Diagnosis present

## 2011-09-10 DIAGNOSIS — R000 Tachycardia, unspecified: Secondary | ICD-10-CM | POA: Diagnosis present

## 2011-09-10 HISTORY — DX: Other seasonal allergic rhinitis: J30.2

## 2011-09-10 HISTORY — DX: Catatonic schizophrenia: F20.2

## 2011-09-10 MED ORDER — MIRTAZAPINE 15 MG PO TBDP
15.0000 mg | ORAL_TABLET | Freq: Every day | ORAL | Status: DC
  Administered 2011-09-10: 15 mg via ORAL
  Filled 2011-09-10 (×2): qty 1

## 2011-09-10 MED ORDER — ALBUTEROL SULFATE (5 MG/ML) 0.5% IN NEBU: 2.5000 mg | INHALATION_SOLUTION | Freq: Four times a day (QID) | RESPIRATORY_TRACT | Status: DC | PRN

## 2011-09-10 MED ORDER — LORAZEPAM 1 MG PO TABS
1.0000 mg | ORAL_TABLET | Freq: Four times a day (QID) | ORAL | Status: DC | PRN
  Administered 2011-09-10: 1 mg via ORAL
  Filled 2011-09-10: qty 1

## 2011-09-10 MED ORDER — ALBUTEROL SULFATE HFA 108 (90 BASE) MCG/ACT IN AERS: 2.0000 | INHALATION_SPRAY | Freq: Four times a day (QID) | RESPIRATORY_TRACT | Status: DC | PRN

## 2011-09-10 MED ORDER — ACETAMINOPHEN 325 MG PO TABS: 650.0000 mg | ORAL_TABLET | Freq: Four times a day (QID) | ORAL | Status: DC | PRN

## 2011-09-10 MED ORDER — MAGNESIUM HYDROXIDE 400 MG/5ML PO SUSP: 30.0000 mL | Freq: Every day | ORAL | Status: DC | PRN

## 2011-09-10 MED ORDER — RISPERIDONE 2 MG PO TBDP
2.0000 mg | ORAL_TABLET | Freq: Every day | ORAL | Status: DC
  Administered 2011-09-10: 2 mg via ORAL
  Filled 2011-09-10 (×3): qty 1

## 2011-09-10 MED ORDER — TRAZODONE HCL 100 MG PO TABS: 100.0000 mg | ORAL_TABLET | Freq: Every evening | ORAL | Status: DC | PRN

## 2011-09-10 MED ORDER — HALOPERIDOL LACTATE 5 MG/ML IJ SOLN: 5.0000 mg | Freq: Four times a day (QID) | INTRAMUSCULAR | Status: DC | PRN

## 2011-09-10 MED ORDER — LORAZEPAM 2 MG/ML IJ SOLN: 1.0000 mg | Freq: Four times a day (QID) | INTRAMUSCULAR | Status: DC | PRN

## 2011-09-10 MED ORDER — ALUM & MAG HYDROXIDE-SIMETH 200-200-20 MG/5ML PO SUSP: 30.0000 mL | ORAL | Status: DC | PRN

## 2011-09-10 MED ORDER — HALOPERIDOL 5 MG PO TABS
5.0000 mg | ORAL_TABLET | Freq: Four times a day (QID) | ORAL | Status: DC | PRN
  Administered 2011-09-10: 5 mg via ORAL
  Filled 2011-09-10: qty 1

## 2011-09-10 NOTE — Tx Team (Addendum)
Initial Interdisciplinary Treatment Plan  PATIENT STRENGTHS: (choose at least two) Average or above average intelligence Communication skills General fund of knowledge Work skills  PATIENT STRESSORS: Occupational concerns   PROBLEM LIST: Problem List/Patient Goals Date to be addressed Date deferred Reason deferred Estimated date of resolution  Depression 09-10-11     Pt. Preoccupied ? voices 09-09-41                                                DISCHARGE CRITERIA:  Ability to meet basic life and health needs Adequate post-discharge living arrangements Improved stabilization in mood, thinking, and/or behavior Need for constant or close observation no longer present Verbal commitment to aftercare and medication compliance  PRELIMINARY DISCHARGE PLAN: Participate in family therapy Placement in alternative living arrangements  PATIENT/FAMIILY INVOLVEMENT: This treatment plan has been presented to and reviewed with the patient, Debra Guerrero, and/or family member.  The patient and family have been given the opportunity to ask questions and make suggestions.  Mickeal Needy 09/10/2011, 3:53 AM

## 2011-09-10 NOTE — ED Notes (Signed)
Pt accepted to [facility]. Report called and [facility] to transport.

## 2011-09-10 NOTE — [facility] Suicide Risk Assessment (Signed)
Suicide Risk Assessment  Admission Assessment     Demographic factors:  Assessment Details Time of Assessment: Admission Information Obtained From: Patient Current Mental Status:  Current Mental Status:  (Pt denie SHI) Loss Factors:  Loss Factors: Financial problems / change in socioeconomic status Historical Factors:    Risk Reduction Factors:  Risk Reduction Factors: Living with another person, especially a relative  CLINICAL FACTORS:   Schizophrenia:   Paranoid or undifferentiated type  COGNITIVE FEATURES THAT CONTRIBUTE TO RISK:  Thought constriction (tunnel vision)    Diagnosis:  Axis I:  Schizophrenia - Paranoid Type.  The patient was seen today and reports the following:   ADL's: Intact.  Sleep: The patient reports to having significant difficulty initiating and maintaining sleep last night.  Appetite: The patient reports a decreased appetite today.   Mild>(1-10) >Severe  Hopelessness (1-10): 0  Depression (1-10): 0  Anxiety (1-10): 0   Suicidal Ideation: The patient denies any suicidal ideations today.  Plan: No  Intent: No  Means: No   Homicidal Ideation: The patient denies any homicidal ideations today.  Plan: No  Intent: No.  Means: No   General Appearance/Behavior: The patient was very guarded with this provider and would not freely answer questions.  Eye Contact: Fair.  Speech: Appropriate in rate and volume today with no pressuring noted.  However there was little spontaneous speech.  Motor Behavior: wnl.  Level of Consciousness: Alert and Oriented x 3.  Mental Status: Alert and Oriented x 3.  Mood: Appears moderately depressed today.  Affect: Appears moderately constricted.  Anxiety Level: No anxiety reported today.  Thought Process: She appears guarded and paranoid in her thinking. Thought Content: The patient denies any auditory or visual hallucinations today. She appears guarded and paranoid in her thinking. Perception: She appears guarded and  paranoid in her thinking. Judgment: Poor.  Insight: Poor.  Cognition: Oriented to person, place and time.   Current Medications: 1.  Risperdal M-tabs 2 mgs po qhs. 2.  Trazodone 100 mgs po qhs - prn - sleep.  Lab Results: No results found for this or any previous visit (from the past 48 hour(s)).   Review of Systems:  Neurological: The patient denies any headaches today. She denies any seizures or dizziness.  G.I.: The patient denies any constipation or G.I. Upset today.  Musculoskeletal: The patient denies any musculoskeletal concerns today.  Time was spent today discussing with the patient the situation leading to her admission.  The patient was guarded and would not freely answer questions and would only answer questions after much encouragement.  She stated she was admitted to suicidal thoughts.  The patient states that she is having difficulty initiating and maintaining sleep.  She also reports a decreased appetite.  She denies any significant feelings of sadness, anhedonia or depressed mood but appears moderately depressed with a constricted affect.  The patient denies any anxiety symptoms today and denies any auditory or visual hallucinations or delusional thinking.  However the patient appears paranoid in her presentation.  Treatment Plan Summary:  1. Daily contact with patient to assess and evaluate symptoms and progress in treatment.  2. Medication management  3. The patient will deny suicidal ideations or homicidal ideations for 48 hours prior to discharge and have a depression and anxiety rating of 3 or less. The patient will also deny any auditory or visual hallucinations or delusional thinking.  4. The patient will deny any symptoms of substance withdrawal at time of discharge.   Plan:  1. Will start the medication Risperdal M-tabs at 2 mgs po qhs for suspected paranoid delusions. 2. Will start the medication Remeron Sol Tabs at 15 mgs po qhs for sleep, depression and  appetite. 3. Laboratory studies reviewed.  4. Will order a TSH, Free T3 and Free T4 to evaluate the patient's thyroid functioning. 5. Will make the patient a No Roommate until she is further stabilized. 6. Will continue to monitor.   SUICIDE RISK:   Mild:  Suicidal ideation of limited frequency, intensity, duration, and specificity.  There are no identifiable plans, no associated intent, mild dysphoria and related symptoms, good self-control (both objective and subjective assessment), few other risk factors, and identifiable protective factors, including available and accessible social support.  Claudean Leavelle 09/10/2011, 11:15 AM

## 2011-09-10 NOTE — Progress Notes (Addendum)
Patient continues to be isolative on unit.  Has stayed in room majority of morning.  She met with nurse and MD and she presents with minimal interaction, giving "yes" or "no" answers.  She endorses no SI/HI/AVH.  She does appear to have internal stimuli present, even though she denies it.  She exhibits paranoid behavior.  She states she does have any depression.  She said she slept poorly and could not express why.  She also states her appitite is poor.   Continue to monitor medication management and MD order.  Continue 15 min safety checks.  Monitor for aggressive behavior.  Patient is not engaged with socialization on unit; she has minimal interaction with staff.  She has not exhibited any aggressive behavior today. Patient has been pacing from her room to the double doors requesting to be let out. She repeats, "I have to get out of here."  She has been knocking on the nurses station door.  She is easily redirected back to her room.  Relayed information to MD re same.

## 2011-09-10 NOTE — H&P (Signed)
Psychiatric Admission Assessment Adult  Patient Identification:  Debra Guerrero  Date of Evaluation:  09/10/2011  Chief Complaint:  major depressive Disorder, Single Episode  History of Present Illness: This is a 26 year old African-American female, admitted to [facility] from the [facility] ED with complaints  ofsuicidal ideations without plans. Patient reports, "I was thinking about committing suicide the other day. I don't know why I am feeling this way. My whole life is not good. I don't know what is going on. I know that I am not depressed. All I did was think about suicide, I did not actually tried to do it any way. I don't know why everyone keep bothering me with questions about why I was thinking about suicide. If I know I will tell you all. Right now, I can't give you any reason. I feel very irritated right now and I want to be left alone".  Mood Symptoms:  Anhedonia, Sadness, SI,  Depression Symptoms:  depressed mood, suicidal thoughts with specific plan,  (Hypo) Manic Symptoms:  Irritable Mood,  Anxiety Symptoms:  Excessive Worry,  Psychotic Symptoms:  Hallucinations: None  PTSD Symptoms: Had a traumatic exposure:  None reported  Past Psychiatric History: Diagnosis: Major depressive disorder, recurrent episode  Hospitalizations: [facility]  Outpatient Care: None reported  Substance Abuse Care: None reported  Self-Mutilation: Denies  Suicidal Attempts: Denies attempt, admits thoughts  Violent Behaviors: None reported   Past Medical History:   Past Medical History  Diagnosis Date  . Asthma   . Headache   . Depression     Allergies:  No Known Allergies PTA Medications: Prescriptions prior to admission  Medication Sig Dispense Refill  . albuterol (PROVENTIL HFA;VENTOLIN HFA) 108 (90 BASE) MCG/ACT inhaler Inhale 2 puffs into the lungs every 6 (six) hours as needed. Shortness of breath      . albuterol (PROVENTIL) (2.5 MG/3ML) 0.083% nebulizer solution Take 2.5 mg by  nebulization every 6 (six) hours as needed.           Substance Abuse History in the last 12 months: Substance Age of 1st Use Last Use Amount Specific Type  Nicotine "I don't smoke cigarettes, drink alcohol and or use any illegal drugs"     Alcohol      Cannabis      Opiates      Cocaine      Methamphetamines      LSD      Ecstasy      Benzodiazepines      Caffeine      Inhalants      Others:                         Consequences of Substance Abuse: Medical Consequences:  Liver damage, Legal Consequences:  Arrests, jail time Family Consequences:  family discord  Social History: Current Place of Residence:  "I can't tell you that, I'm sorry"  Place of Birth: [place]    Family Members: "Why do want to know that"  Marital Status:  Single  Children: 1  Sons:0  Daughters:0  Relationships:"I said I'm single"  Education:  HS Graduate  Educational Problems/Performance: None reported  Religious Beliefs/Practices: None reported  History of Abuse (Emotional/Phsycial/Sexual): None reporte  Occupational Experiences: None reported  Military History:  None.  Legal History: None reported  Hobbies/Interests: None reported  Family History:  History reviewed. No pertinent family history.  Mental Status Examination/Evaluation: Objective:  Appearance: Casual, Fairly Groomed and  Guarded  Eye Contact::  Good  Speech:  Clear and Coherent  Volume:  Normal  Mood:  Irritable  Affect:  Blunt and Flat  Thought Process:  Disorganized  Orientation:  Full  Thought Content:  Paranoid Ideation  Suicidal Thoughts:  Yes.  without intent/plan  Homicidal Thoughts:  No  Memory:  Immediate;   Good Recent;   Good Remote;   Fair  Judgement:  Impaired  Insight:  Lacking  Psychomotor Activity:  Normal  Concentration:  Fair  Recall:  Good  Akathisia:  No  Handed:  Right  AIMS (if indicated):     Assets:  Desire for Improvement  Sleep:       Laboratory/X-Ray: None  Psychological Evaluation(s)      Assessment:    AXIS I:  Schizophrenia, paranoia type. AXIS II:  Deferred AXIS III:   Past Medical History  Diagnosis Date  . Asthma   . Headache   . Depression    AXIS IV:  other psychosocial or environmental problems AXIS V:  11-20 some danger of hurting self or others possible OR occasionally fails to maintain minimal personal hygiene OR gross impairment in communication  Treatment Plan/Recommendations: Admit for safety and stabilization. Review and reinstate any pertinent home medications for other health issues. Obtain TSH, T4, T3 Group counseling and activities.  Treatment Plan Summary: Daily contact with patient to assess and evaluate symptoms and progress in treatment Medication management  Current Medications:  Current Facility-Administered Medications  Medication Dose Route Frequency Provider Last Rate Last Dose  . acetaminophen (TYLENOL) tablet 650 mg  650 mg Oral Q6H PRN Jorje Guild, PA-C      . albuterol (PROVENTIL HFA;VENTOLIN HFA) 108 (90 BASE) MCG/ACT inhaler 2 puff  2 puff Inhalation Q6H PRN Curlene Labrum Readling, MD      . albuterol (PROVENTIL) (5 MG/ML) 0.5% nebulizer solution 2.5 mg  2.5 mg Nebulization Q6H PRN Ronny Bacon, MD      . alum & mag hydroxide-simeth (MAALOX/MYLANTA) 200-200-20 MG/5ML suspension 30 mL  30 mL Oral Q4H PRN Jorje Guild, PA-C      . magnesium hydroxide (MILK OF MAGNESIA) suspension 30 mL  30 mL Oral Daily PRN Jorje Guild, PA-C      . risperiDONE (RISPERDAL M-TABS) disintegrating tablet 2 mg  2 mg Oral QHS Curlene Labrum Readling, MD      . traZODone (DESYREL) tablet 100 mg  100 mg Oral QHS PRN Ronny Bacon, MD       Facility-Administered Medications Ordered in Other Encounters  Medication Dose Route Frequency Provider Last Rate Last Dose  . DISCONTD: acetaminophen (TYLENOL) tablet 650 mg  650 mg Oral Q4H PRN Dorthula Matas, PA      . DISCONTD: alum & mag hydroxide-simeth (MAALOX/MYLANTA) 200-200-20 MG/5ML  suspension 30 mL  30 mL Oral PRN Dorthula Matas, PA      . DISCONTD: ibuprofen (ADVIL,MOTRIN) tablet 600 mg  600 mg Oral Q8H PRN Dorthula Matas, PA      . DISCONTD: nicotine (NICODERM CQ - dosed in mg/24 hours) patch 21 mg  21 mg Transdermal Daily Dorthula Matas, PA      . DISCONTD: ondansetron (ZOFRAN) tablet 4 mg  4 mg Oral Q8H PRN Dorthula Matas, PA      . DISCONTD: zolpidem (AMBIEN) tablet 5 mg  5 mg Oral QHS PRN Dorthula Matas, PA        Observation Level/Precautions:  Q 15 minute checks for safety  Laboratory:  Obtain TSH, T3, T4  Psychotherapy:  Group  Medications: See medication lists   Routine PRN Medications:  Yes  Consultations:  None indicated at this time  Discharge Concerns:  Safety  Other:     Sanjuana Kava 7/4/201311:14 AM

## 2011-09-10 NOTE — Progress Notes (Signed)
Patient ID: Debra Guerrero, female   DOB: [birth year], 26 y.o.   MRN: [id number] D: Pt. In bed with cold pack to forehead, bump to left side of forehead. Pt. Reports "I did it, I bumped my head." Pt. Continue to denies AVH, but clearly preoccupied.  "I do not hear voices, you can get out now, get out". A: monitored 1:1, encouraged to take medication and take a shower. R: Pt. Refused shower and meds. R: Pt. Is safe on the unit. Pt. Eventually takes shower and meds. Pt. Eats a snack later also.

## 2011-09-10 NOTE — Progress Notes (Signed)
Pt did not attend d/c planning group on this date.  SW met with pt individually at this time.  Pt presents with paranoid affect and mood.  Pt was guarded with answering questions and continuously looked behind her to see if anyone was there, despite being in her room alone.  Pt states that she was depressed and had thoughts of killing herself.  Pt states that she didn't act on it, just had thoughts.  Pt states that she doesn't feel this way today.  Pt states that she was living with her mom in [place] but was kicked out and is now homeless.  Pt states that she follows up at [facility].  SW will continue to monitor pt's needs as pt becomes more stable.  No further needs voiced by pt at this time.    Reyes Ivan, LCSWA 09/10/2011  12:04 PM

## 2011-09-10 NOTE — Progress Notes (Signed)
Patient ID: Debra Guerrero, female   DOB: [birth year], 26 y.o.   MRN: [id number] Pt. Is 26 y.o. Female who was taken to the ED by mom for "acting weird". Mom also report pt. Been depressed since sophomore in college. Pt. Was also having SHI, but currently denies.  Pt.  was IVC'd when she attempted to leave the ED. Pt. Denies AVH, but is clearly preoccupied with internal stimuli. Pt. Is paranoid and blocking. Pt. Had to be handled carefully as she was guarded and did not want to be touched. Pt. Was also an elopement risk. Pt. Refused to sign tx agreement. Pt. Has no significant medical history.  Pt. Offered food and drink, oriented to unit/room. Pt. To be monitored q7min for safety.

## 2011-09-10 NOTE — Progress Notes (Signed)
Patient given haldol 5 mg and ativan 1 mg for aggitation.  She proceeded to fall forward on the bed and backward on to the floor.  Patient attempted to bang her head against the floor.  She did hit her head and has a small bump on her forehead.  The skin in intact and she has an icepack.  A pillow was placed under her head until she ceased the behavior.  She then proceeded to attempt to walk without any success.  She was placed in bed and order was obtained for 1:1.  She will remain 1:1 until further assessment is made.  Patient is safe at this time with a sitter in place.

## 2011-09-11 ENCOUNTER — Inpatient Hospital Stay (HOSPITAL_COMMUNITY): Payer: Medicaid Other

## 2011-09-11 ENCOUNTER — Inpatient Hospital Stay (HOSPITAL_COMMUNITY)
Admission: EM | Admit: 2011-09-11 | Discharge: 2011-09-24 | DRG: 071 | Disposition: A | Discharge status: Other Acute Inpatient Hospital | Payer: Medicaid Other | Attending: Internal Medicine | Admitting: Internal Medicine

## 2011-09-11 ENCOUNTER — Other Ambulatory Visit: Payer: Self-pay

## 2011-09-11 ENCOUNTER — Encounter (HOSPITAL_COMMUNITY): Payer: Self-pay | Admitting: *Deleted

## 2011-09-11 ENCOUNTER — Inpatient Hospital Stay
Admission: AD | Admit: 2011-09-11 | Payer: No Typology Code available for payment source | Source: Ambulatory Visit | Admitting: Internal Medicine

## 2011-09-11 DIAGNOSIS — G43019 Migraine without aura, intractable, without status migrainosus: Secondary | ICD-10-CM | POA: Diagnosis present

## 2011-09-11 DIAGNOSIS — Z87891 Personal history of nicotine dependence: Secondary | ICD-10-CM

## 2011-09-11 DIAGNOSIS — E876 Hypokalemia: Secondary | ICD-10-CM | POA: Diagnosis present

## 2011-09-11 DIAGNOSIS — F202 Catatonic schizophrenia: Secondary | ICD-10-CM | POA: Diagnosis present

## 2011-09-11 DIAGNOSIS — R000 Tachycardia, unspecified: Secondary | ICD-10-CM | POA: Diagnosis present

## 2011-09-11 DIAGNOSIS — F200 Paranoid schizophrenia: Secondary | ICD-10-CM

## 2011-09-11 DIAGNOSIS — G43919 Migraine, unspecified, intractable, without status migrainosus: Secondary | ICD-10-CM | POA: Diagnosis present

## 2011-09-11 DIAGNOSIS — R4182 Altered mental status, unspecified: Secondary | ICD-10-CM | POA: Diagnosis present

## 2011-09-11 DIAGNOSIS — J45909 Unspecified asthma, uncomplicated: Secondary | ICD-10-CM | POA: Diagnosis present

## 2011-09-11 DIAGNOSIS — G9340 Encephalopathy, unspecified: Secondary | ICD-10-CM | POA: Diagnosis present

## 2011-09-11 DIAGNOSIS — J3089 Other allergic rhinitis: Secondary | ICD-10-CM | POA: Diagnosis present

## 2011-09-11 DIAGNOSIS — J302 Other seasonal allergic rhinitis: Secondary | ICD-10-CM

## 2011-09-11 DIAGNOSIS — G9349 Other encephalopathy: Principal | ICD-10-CM | POA: Diagnosis present

## 2011-09-11 HISTORY — DX: Other seasonal allergic rhinitis: J30.2

## 2011-09-11 HISTORY — DX: Catatonic schizophrenia: F20.2

## 2011-09-11 LAB — MRSA PCR SCREENING: MRSA by PCR: NEGATIVE

## 2011-09-11 LAB — CBC
HCT: 43.1 % (ref 36.0–46.0)
HCT: 39.9 % (ref 36.0–46.0)
Hemoglobin: 14.5 g/dL (ref 12.0–15.0)
Hemoglobin: 13.2 g/dL (ref 12.0–15.0)
MCH: 27.8 pg (ref 26.0–34.0)
MCHC: 33.6 g/dL (ref 30.0–36.0)
MCHC: 33.1 g/dL (ref 30.0–36.0)
MCV: 83.4 fL (ref 78.0–100.0)
MCV: 84 fL (ref 78.0–100.0)
RDW: 13.1 % (ref 11.5–15.5)
RDW: 13.2 % (ref 11.5–15.5)

## 2011-09-11 LAB — BASIC METABOLIC PANEL
BUN: 6 mg/dL (ref 6–23)
Chloride: 103 mEq/L (ref 96–112)
Creatinine, Ser: 0.63 mg/dL (ref 0.50–1.10)
GFR calc Af Amer: >90 mL/min (ref >90)
Glucose, Bld: 103 mg/dL — ABNORMAL HIGH (ref 70–99)
Potassium: 3.4 mEq/L — ABNORMAL LOW (ref 3.5–5.1)

## 2011-09-11 LAB — CARDIAC PANEL(CRET KIN+CKTOT+MB+TROPI): Total CK: 56 U/L (ref 7–177)

## 2011-09-11 LAB — URINALYSIS, ROUTINE W REFLEX MICROSCOPIC
Bilirubin Urine: NEGATIVE
Hgb urine dipstick: NEGATIVE
Ketones, ur: NEGATIVE mg/dL
Nitrite: NEGATIVE
Specific Gravity, Urine: 1.013 (ref 1.005–1.030)
pH: 6.5 (ref 5.0–8.0)

## 2011-09-11 LAB — D-DIMER, QUANTITATIVE: D-Dimer, Quant: 0.48 ug/mL-FEU (ref 0.00–0.48)

## 2011-09-11 LAB — MAGNESIUM
Magnesium: 2.1 mg/dL (ref 1.5–2.5)
Magnesium: 2 mg/dL (ref 1.5–2.5)

## 2011-09-11 LAB — URINE MICROSCOPIC-ADD ON

## 2011-09-11 LAB — T4, FREE: Free T4: 1.64 ng/dL (ref 0.80–1.80)

## 2011-09-11 MED ORDER — ACETAMINOPHEN 650 MG RE SUPP: 650.0000 mg | Freq: Four times a day (QID) | RECTAL | Status: DC | PRN

## 2011-09-11 MED ORDER — SODIUM CHLORIDE 0.9 % IV SOLN: INTRAVENOUS | Status: DC

## 2011-09-11 MED ORDER — SODIUM CHLORIDE 0.9 % IJ SOLN
3.0000 mL | Freq: Two times a day (BID) | INTRAMUSCULAR | Status: DC
  Administered 2011-09-11 – 2011-09-15 (×6): 3 mL via INTRAVENOUS

## 2011-09-11 MED ORDER — POTASSIUM CHLORIDE CRYS ER 20 MEQ PO TBCR
20.0000 meq | EXTENDED_RELEASE_TABLET | Freq: Once | ORAL | Status: DC
  Filled 2011-09-11: qty 1

## 2011-09-11 MED ORDER — METOPROLOL TARTRATE 1 MG/ML IV SOLN: 5.0000 mg | Freq: Once | INTRAVENOUS | Status: DC

## 2011-09-11 MED ORDER — ACETAMINOPHEN 325 MG PO TABS
650.0000 mg | ORAL_TABLET | Freq: Four times a day (QID) | ORAL | Status: DC | PRN
  Administered 2011-09-15 – 2011-09-16 (×2): 650 mg via ORAL
  Filled 2011-09-11 (×2): qty 2

## 2011-09-11 MED ORDER — SODIUM CHLORIDE 0.9 % IV BOLUS (SEPSIS)
1000.0000 mL | Freq: Once | INTRAVENOUS | Status: AC
  Administered 2011-09-11: 1000 mL via INTRAVENOUS

## 2011-09-11 MED ORDER — POTASSIUM CHLORIDE IN NACL 40-0.9 MEQ/L-% IV SOLN
INTRAVENOUS | Status: DC
  Administered 2011-09-12: 04:00:00 via INTRAVENOUS
  Filled 2011-09-11 (×3): qty 1000

## 2011-09-11 MED ORDER — ONDANSETRON HCL 4 MG PO TABS: 4.0000 mg | ORAL_TABLET | Freq: Four times a day (QID) | ORAL | Status: DC | PRN

## 2011-09-11 MED ORDER — ONDANSETRON HCL 4 MG/2ML IJ SOLN: 4.0000 mg | Freq: Four times a day (QID) | INTRAMUSCULAR | Status: DC | PRN

## 2011-09-11 MED ORDER — ENOXAPARIN SODIUM 40 MG/0.4ML ~~LOC~~ SOLN
40.0000 mg | SUBCUTANEOUS | Status: DC
  Administered 2011-09-11 – 2011-09-23 (×7): 40 mg via SUBCUTANEOUS
  Filled 2011-09-11 (×14): qty 0.4

## 2011-09-11 NOTE — ED Notes (Signed)
Per EMS report, pt from [facility]: Pt has IVC order.  Acting lethargic and willfully not responding to the staff at [facility].  Refused to be verbal w/ EMS.  Moving spastically.  HR:144.  Initially HR was 168. Pt became verbal when EMS was trying to transport her.  Pt did elope when she was here yesterday at [facility].

## 2011-09-11 NOTE — Consult Note (Signed)
[facility] CONSULT NOTE     Reason for Consult: AMS    HPI:   Majority of history is obtained from chart --patient is non vocal.   Debra Guerrero is an 26 y.o. female history of asthma, migraine headaches, depression who was admitted to behavioral health center for paranoid schizophrenia and suicidal ideation. Patient was transferred from behavioral health to the ED due to tachycardia and lethargy. Per notes when "EMS was bringing the patient heart rate was initially in the 160s and went down to the 140s. Patient is not answering any questions. Patient's mother is at the bedside and history was obtained from the mother. Per patient's mother over the past week the patient had been going to [facility] for her depression however mother felt patient may be more depressed. Patient to per mother was in the living room hadn't been watching television however would be staring straight ahead and stated that she didn't know what was wrong with her. Patient stopped going to school and patient stopped taking her daughter to school. Patient was having some poor hygiene and not changing her clothes with some numerous occasions of confusion. Patient was subsequently brought to the ED and did have some suicidal ideation at that time. Patient was subsequently admitted to behavioral [facility] one day prior to admission secondary to paranoid schizophrenia."   Upon my arrival to [facility] ED patient had just ambulated to bathroom, wiped herself and ambulated back to the bed. The tech who is at bedside states patient stated " i cannot move my arm" when blood pressure was being obtained.    Past Medical History  Diagnosis Date  . Asthma   . Headache   . Depression   . Seasonal allergies 09/11/2011    History reviewed. No pertinent past surgical history.  History reviewed. No pertinent family history.  Social History:  reports that she has quit smoking. Her smoking use included  Cigarettes. She has never used smokeless tobacco. She reports that she does not drink alcohol or use illicit drugs.  No Known Allergies  Medications:    Prior to Admission:  (Not in a hospital admission) Scheduled:   . sodium chloride  1,000 mL Intravenous Once  . sodium chloride  1,000 mL Intravenous Once  . DISCONTD: sodium chloride   Intravenous STAT  . DISCONTD: mirtazapine  15 mg Oral QHS  . DISCONTD: potassium chloride  20 mEq Oral Once  . DISCONTD: risperiDONE  2 mg Oral QHS    Review of Systems - General ROS: negative for - chills, fatigue, fever or hot flashes Hematological and Lymphatic ROS: negative for - bruising, fatigue, jaundice or pallor Endocrine ROS: negative for - hair pattern changes, hot flashes, mood swings or skin changes Respiratory ROS: negative for - cough, hemoptysis, orthopnea or wheezing Cardiovascular ROS: negative for - dyspnea on exertion, orthopnea, palpitations or shortness of breath Gastrointestinal ROS: negative for - abdominal pain, appetite loss, blood in stools, diarrhea or hematemesis Musculoskeletal ROS: negative for - joint pain, joint stiffness, joint swelling or muscle pain Neurological ROS: positive for - drowsiness  Dermatological ROS: negative for dry skin, pruritus and rash   Blood pressure 140/97, pulse 110, temperature 98.7 F (37.1 C), temperature source Oral, resp. rate 18, height 5\' 4"  (1.626 m), weight 81.647 kg (180 lb), last menstrual period 09/09/2011, SpO2 99.00%.   Neurologic Examination:  Mental Status: Tracks my movements in room, squeezes my hands to command.  Will not answer questions, watches everything I am doing in room. Holding jaw tightly just and will not let me examine mouth. Cranial Nerves: II-Visual fields grossly intact--blinks to threat bilaterally. III/IV/VI-Extraocular movements intact--tracks my hand and all my movemenmts.  Pupils reactive bilaterally. Ptosis not present. V/VII-Face  symmetric VIII-grossly intact-winces to pain to supraorbital pressure XII-would not stick tongue out Motor: She will squeeze my hand bilaterally to command and hold both arms antigravity for a few seconds.  When hands are held above her head she will not let her arm hit her head when I let go.  She will not move her legs to command but will withdrawal briskly when plantar stimulation is given.  No neck stiffness, tone is normal through out without any tremor or rigidity.  Sensory: Pinprick and light touch intact throughout, bilaterally Deep Tendon Reflexes: 2+ brisk and symmetric throughout.     Plantars:      Right:  downgoing     Left:  Downgoing   No results found for this basename: cbc, bmp, coags, chol, tri, ldl, hga1c    Results for orders placed during the hospital encounter of 09/10/11 (from the past 48 hour(s))  TSH     Status: Normal   Collection Time   09/11/11  5:30 AM      Component Value Range Comment   TSH 0.753  0.350 - 4.500 uIU/mL   T3, FREE     Status: Abnormal   Collection Time   09/11/11  5:30 AM      Component Value Range Comment   T3, Free 5.2 (*) 2.3 - 4.2 pg/mL   CBC     Status: Abnormal   Collection Time   09/11/11  5:30 AM      Component Value Range Comment   WBC 7.3  4.0 - 10.5 K/uL    RBC 5.17 (*) 3.87 - 5.11 MIL/uL    Hemoglobin 14.5  12.0 - 15.0 g/dL    HCT 11.9  14.7 - 82.9 %    MCV 83.4  78.0 - 100.0 fL    MCH 28.0  26.0 - 34.0 pg    MCHC 33.6  30.0 - 36.0 g/dL    RDW 56.2  13.0 - 86.5 %    Platelets 252  150 - 400 K/uL   BASIC METABOLIC PANEL     Status: Abnormal   Collection Time   09/11/11  5:30 AM      Component Value Range Comment   Sodium 140  135 - 145 mEq/L    Potassium 3.4 (*) 3.5 - 5.1 mEq/L    Chloride 103  96 - 112 mEq/L    CO2 24  19 - 32 mEq/L    Glucose, Bld 103 (*) 70 - 99 mg/dL    BUN 6  6 - 23 mg/dL    Creatinine, Ser 7.84  0.50 - 1.10 mg/dL    Calcium 9.6  8.4 - 69.6 mg/dL    GFR calc non Af Amer >90  >90 mL/min    GFR calc  Af Amer >90  >90 mL/min   URINALYSIS, ROUTINE W REFLEX MICROSCOPIC     Status: Abnormal   Collection Time   09/11/11  5:32 AM      Component Value Range Comment   Color, Urine YELLOW  YELLOW    APPearance CLEAR  CLEAR    Specific Gravity, Urine 1.013  1.005 - 1.030  pH 6.5  5.0 - 8.0    Glucose, UA NEGATIVE  NEGATIVE mg/dL    Hgb urine dipstick NEGATIVE  NEGATIVE    Bilirubin Urine NEGATIVE  NEGATIVE    Ketones, ur NEGATIVE  NEGATIVE mg/dL    Protein, ur NEGATIVE  NEGATIVE mg/dL    Urobilinogen, UA 0.2  0.0 - 1.0 mg/dL    Nitrite NEGATIVE  NEGATIVE    Leukocytes, UA SMALL (*) NEGATIVE   URINE MICROSCOPIC-ADD ON     Status: Abnormal   Collection Time   09/11/11  5:32 AM      Component Value Range Comment   Squamous Epithelial / LPF FEW (*) RARE    WBC, UA 3-6  <3 WBC/hpf    RBC / HPF 0-2  <3 RBC/hpf    Bacteria, UA FEW (*) RARE   D-DIMER, QUANTITATIVE     Status: Normal   Collection Time   09/11/11  8:45 AM      Component Value Range Comment   D-Dimer, Quant 0.48  0.00 - 0.48 ug/mL-FEU     Dg Chest 2 View  09/11/2011  *RADIOLOGY REPORT*  Clinical Data: Medical clearance  CHEST - 2 VIEW  Comparison: 01/15/2011  Findings: Lungs are clear. No pleural effusion or pneumothorax.  Cardiomediastinal silhouette is within normal limits.  Visualized osseous structures are within normal limits.  IMPRESSION: Normal chest radiographs.  Original Report Authenticated By: Charline Bills, M.D.   Ct Head Wo Contrast  09/11/2011  *RADIOLOGY REPORT*  Clinical Data: Altered mental status secondary to a fall.  CT HEAD WITHOUT CONTRAST  Technique:  Contiguous axial images were obtained from the base of the skull through the vertex without contrast.  Comparison: 09/09/2011  Findings: There is no acute intracranial hemorrhage, infarction, or mass lesion.  Brain parenchyma is normal.  Osseous structures are normal.  IMPRESSION: Normal exam, unchanged.  Original Report Authenticated By: Gwynn Burly, M.D.    Mr Brain Wo Contrast  09/11/2011  *RADIOLOGY REPORT*  Clinical Data: Head trauma.  Mental status changes.  MRI HEAD WITHOUT CONTRAST  Technique:  Multiplanar, multiecho pulse sequences of the brain and surrounding structures were obtained according to standard protocol without intravenous contrast.  Comparison: Head CT 09/11/2011 and 09/09/2011  Findings: The brain has a normal appearance on all pulse sequences without evidence of malformation, atrophy, old or acute infarction, mass lesion, hemorrhage, hydrocephalus or extra-axial collection. No pituitary mass.  No inflammatory sinus disease.  No skull or skull base lesion.  IMPRESSION: Normal MRI of the brain.  Original Report Authenticated By: Thomasenia Sales, M.D.     Assessment/Plan:   26 YO female with significant psychiatric history who was brought to [facility] ed secondary to elevated HR.  At present time tachycardia has resolved, MRI brain and CT head are normal, TSH is normal, UA is negative other than few leuckocytes and bacteria, ETOH <11, UDS (-), CXR negative.  Exam shows no focal localizing or lateralizing abnormalities.  Patient tracks my movements, follows some commands, responds to noxious stimuli. Physical finding more representative of psychological overlay than central nervous sytem abnormality.   Recommend: 1) Labs already drawn include B12, RPR folate. 2) No further neurology work up recommended.  Neurology will sign off.  Felicie Morn PA-C [facility] [phone]  09/11/2011, 3:50 PM

## 2011-09-11 NOTE — ED Notes (Signed)
Pt.didnt eat her breakfast

## 2011-09-11 NOTE — Progress Notes (Signed)
EMS arrived,Writer gave report to responders. Pt. More alert, able to follow verbal command, Writer explained she would be going to ED for evaluation. Pt. Responds "no I don't wanna go" Pt. Very resistant, began rocking back and forth on stretcher, "no you can't take me". Staff continue to reassure pt. [facility] accompanies pt. To ED.

## 2011-09-11 NOTE — ED Notes (Signed)
Mother at bedside, states- daughter (pt) has been fine until 2 weeks ago. Has never been treated for any psychiatric disorder. Had a migraine, was seen by private dr., given a shot for pain, and started becoming more withdrawn. Completely stopped taking 26 year old daughter to school, quit going to work and class, stayed in room, becoming more nonverbal.

## 2011-09-11 NOTE — ED Notes (Signed)
Pt.walk to the bathroom with direction,wash herself,walk back with direction

## 2011-09-11 NOTE — ED Provider Notes (Addendum)
History     CSN: [id number]  Arrival date & time 09/11/11  4098   First MD Initiated Contact with Patient 09/11/11 0448      Chief Complaint  Patient presents with  . Tachycardia  . Medical Clearance    (Consider location/radiation/quality/duration/timing/severity/associated sxs/prior treatment) HPI 26 year old female presents to emergency department in transfer from behavioral health. Patient was sent to behavioral health yesterday morning due to paranoid schizophrenia. Patient was sent back for medical evaluation due to tachycardia, lethargy. EMS reports heart rate initially in the 160s now in the 140s. Per prior notes, patient has been withdrawing with poor hygiene and not going to school. No prior history of psychiatric disorder. Patient does have history of migraines. Patient answers questions slowly with flat affect giving yes or no answers. Patient denies any shortness of breath, chest pain. She reports that she is feeling fine and does not need to be here Past Medical History  Diagnosis Date  . Asthma   . Headache   . Depression     History reviewed. No pertinent past surgical history.  History reviewed. No pertinent family history.  History  Substance Use Topics  . Smoking status: Former Games developer  . Smokeless tobacco: Not on file  . Alcohol Use: No    OB History    Grav Para Term Preterm Abortions TAB SAB Ect Mult Living                  Review of Systems  Unable to perform ROS: Psychiatric disorder    Allergies  Review of patient's allergies indicates no known allergies.  Home Medications   Current Outpatient Rx  Name Route Sig Dispense Refill  . ALBUTEROL SULFATE HFA 108 (90 BASE) MCG/ACT IN AERS Inhalation Inhale 2 puffs into the lungs every 6 (six) hours as needed. Shortness of breath    . ALBUTEROL SULFATE (2.5 MG/3ML) 0.083% IN NEBU Nebulization Take 2.5 mg by nebulization every 6 (six) hours as needed.        BP 145/98  Pulse 143  Temp 97.9 F  (36.6 C) (Oral)  Resp 17  Ht 5\' 4"  (1.626 m)  Wt 180 lb (81.647 kg)  BMI 30.90 kg/m2  SpO2 98%  LMP 09/09/2011  Physical Exam  Nursing note and vitals reviewed. Constitutional: She is oriented to person, place, and time. She appears well-developed and well-nourished.       Patient is seen lying in bed staring straight ahead. He requires calling her name several times before she will look at me and answer questions.  HENT:  Head: Normocephalic and atraumatic.  Nose: Nose normal.  Mouth/Throat: Oropharynx is clear and moist.  Eyes: Conjunctivae and EOM are normal. Pupils are equal, round, and reactive to light.  Neck: Normal range of motion. Neck supple. No JVD present. No tracheal deviation present. No thyromegaly present.  Cardiovascular: Regular rhythm, normal heart sounds and intact distal pulses.  Exam reveals no gallop and no friction rub.   No murmur heard.      Tachycardia with regular rhythm noted  Pulmonary/Chest: Effort normal and breath sounds normal. No stridor. No respiratory distress. She has no wheezes. She has no rales. She exhibits no tenderness.  Abdominal: Soft. Bowel sounds are normal. She exhibits no distension and no mass. There is no tenderness. There is no rebound and no guarding.  Musculoskeletal: Normal range of motion. She exhibits no edema and no tenderness.  Lymphadenopathy:    She has no cervical adenopathy.  Neurological: She  is oriented to person, place, and time. She exhibits normal muscle tone. Coordination normal.  Skin: Skin is dry. No rash noted. No erythema. No pallor.  Psychiatric:       Flat affect, appears catatonic at times    ED Course  Procedures (including critical care time)  Labs Reviewed  CBC - Abnormal; Notable for the following:    RBC 5.17 (*)     All other components within normal limits  BASIC METABOLIC PANEL - Abnormal; Notable for the following:    Potassium 3.4 (*)     Glucose, Bld 103 (*)     All other components  within normal limits  URINALYSIS, ROUTINE W REFLEX MICROSCOPIC - Abnormal; Notable for the following:    Leukocytes, UA SMALL (*)     All other components within normal limits  URINE MICROSCOPIC-ADD ON - Abnormal; Notable for the following:    Squamous Epithelial / LPF FEW (*)     Bacteria, UA FEW (*)     All other components within normal limits  TSH  T3, FREE  D-DIMER, QUANTITATIVE   Ct Head Wo Contrast  09/09/2011  *RADIOLOGY REPORT*  Clinical Data: Depression and headaches.  CT HEAD WITHOUT CONTRAST  Technique:  Contiguous axial images were obtained from the base of the skull through the vertex without contrast.  Comparison: None.  Findings: No acute intracranial abnormality is present. Specifically, there is no evidence for acute infarct, hemorrhage, mass, hydrocephalus, or extra-axial fluid collection.  The paranasal sinuses and mastoid air cells are clear.  The globes and orbits are intact.  The osseous skull is intact.  IMPRESSION: Negative CT of the head.  Original Report Authenticated By: Jamesetta Orleans. MATTERN, M.D.    Date: 09/11/2011 4:40  Rate: 138  Rhythm: sinus tachycardia  QRS Axis: normal  Intervals: normal  ST/T Wave abnormalities: normal  Conduction Disutrbances:none  Narrative Interpretation:   Old EKG Reviewed: none available   Date: 09/11/2011 04:56  Rate: 108  Rhythm: sinus tachycardia  QRS Axis: normal  Intervals: normal  ST/T Wave abnormalities: normal  Conduction Disutrbances:none  Narrative Interpretation: rate slowed from prior  Old EKG Reviewed: changes noted    1. Schizophrenia, paranoid type       MDM  26 year old female sent from behavioral health for medical evaluation for tachycardia and change in activity and affect. Her sense of urinary tract infection. BMP and CBC within normal limits. Patient will have periods of tachycardia into the 140s, noted when nurses, techs, or doctors are in the room, but slows to the 100s when she is at rest.  Pending is thyroid and d-dimer testing.  Care passed to Dr Denton Lank awaiting lab testing.        Olivia Mackie, MD 09/11/11 1610  Olivia Mackie, MD 09/11/11 [phone]

## 2011-09-11 NOTE — H&P (Signed)
CHARISH SCHROEPFER MRN: [id number] DOB/AGE: 26-17-1987 25 y.o. Primary Care Physician:No primary provider on file. Admit date: 09/10/2011 Chief Complaint: TACHYCARDIA/AMS HPI:  Ronae Noell is a 26 year old African American female with history of asthma, migraine headaches, depression who was admitted to behavioral [facility] center one day prior to admission secondary to paranoid schizophrenia and suicidal ideation. Patient was transferred from behavioral health to the ED secondary to tachycardia and lethargic he. Per ED report when EMS was bringing the patient heart rate was initially in the 160s and went down to the 140s. Patient is not answering any questions in the room however does track. Patient's mother is at the bedside and history was obtained from the mother. Per patient's mother over the past week the patient had been going to [facility] for her depression however mother felt patient may be more depressed. Patient to per mother was in the living room hadn't been watching television however would be staring straight ahead and stated that she didn't know what was wrong with her. Patient stopped going to school and patient stopped taking her daughter to school. Patient was having some poor hygiene and not changing her clothes with some numerous occasions of confusion. Patient was subsequently brought to the ED and did have some suicidal ideation at that time. Patient was subsequently [facility] to [facility] one day prior to admission secondary to paranoid schizophrenia. Patient's mother denies patient has had any fevers, no chills, no chest pain, no abdominal pain, no shortness of breath, no nausea, no vomiting, no diarrhea, no constipation, no dysuria, no weakness. No other associated symptoms. Patient's mother states patient did have upper respiratory symptoms approximately 10 days prior to admission. In the ED patient was noted to be more catatonic. However prior to my evaluation was noted that  patient did ambulate back and forth to the bathroom. Past Medical History  Diagnosis Date  . Asthma   . Headache   . Depression   . Seasonal allergies 09/11/2011    History reviewed. No pertinent past surgical history.  Prior to Admission medications   Medication Sig Start Date End Date Taking? Authorizing Provider  albuterol (PROVENTIL HFA;VENTOLIN HFA) 108 (90 BASE) MCG/ACT inhaler Inhale 2 puffs into the lungs every 6 (six) hours as needed. Shortness of breath    Historical Provider, MD  albuterol (PROVENTIL) (2.5 MG/3ML) 0.083% nebulizer solution Take 2.5 mg by nebulization every 6 (six) hours as needed.      Historical Provider, MD    Allergies: No Known Allergies  History reviewed. No pertinent family history.  Social History:  reports that she has quit smoking. Her smoking use included Cigarettes. She has never used smokeless tobacco. She reports that she does not drink alcohol or use illicit drugs.  ROS: All systems reviewed with the patient and was positive as per HPI otherwise all other systems are negative.  PHYSICAL EXAM: Blood pressure 140/97, pulse 110, temperature 98.7 F (37.1 C), temperature source Oral, resp. rate 18, height 5\' 4"  (1.626 m), weight 81.647 kg (180 lb), last menstrual period 09/09/2011, SpO2 99.00%. General: Alert, awake patient is tracking. Patient is non-verbal. Patient in no acute cardiopulmonary distress. Patient seems catatonic. Patient is not withdrawing to noxious stimuli. HEENT: Patient does have a bump on the head. Pupils are equal round and reactive to light and accommodation. Extraocular movements are intact. Oropharynx is clear, no lesions, no exudates. Neck is supple with no lymphadenopathy. Depressed gag reflex. No bruits, no goiter. Heart: Tachycardic, without murmurs, rubs, gallops.  Lungs: Clear to auscultation bilaterally. Abdomen: Soft, nontender, nondistended, positive bowel sounds. Extremities: No clubbing cyanosis or edema with  positive pedal pulses. Neuro: Patient is nonverbal however is alert not following commands. Patient is tracking. Depressed gag reflex. Patient is not withdrawing to noxious stimuli. Unable to obtain reflexes symmetrically and diffusely. Unable to perform the rest of the neurological exam is patient is not following commands. Patient looks catatonic. Gait not tested secondary to safety.  Psychiatric: Patient is non-verbal tracking catatonic.    EKG: Sinus tachycardia  No results found for this or any previous visit (from the past 240 hour(s)).   Lab results:  Basename 09/11/11 0530 09/09/11 1255  NA 140 135  K 3.4* 3.9  CL 103 97  CO2 24 23  GLUCOSE 103* 91  BUN 6 9  CREATININE 0.63 0.59  CALCIUM 9.6 9.8  MG -- --  PHOS -- --    Basename 09/09/11 1255  AST 18  ALT 15  ALKPHOS 92  BILITOT 0.5  PROT 8.1  ALBUMIN 4.2   No results found for this basename: LIPASE:2,AMYLASE:2 in the last 72 hours  Basename 09/11/11 0530 09/09/11 1255  WBC 7.3 12.9*  NEUTROABS -- --  HGB 14.5 15.0  HCT 43.1 44.0  MCV 83.4 83.8  PLT 252 337   No results found for this basename: CKTOTAL:3,CKMB:3,CKMBINDEX:3,TROPONINI:3 in the last 72 hours No components found with this basename: POCBNP:3  Basename 09/11/11 0845  DDIMER 0.48   No results found for this basename: HGBA1C:2 in the last 72 hours No results found for this basename: CHOL:2,HDL:2,LDLCALC:2,TRIG:2,CHOLHDL:2,LDLDIRECT:2 in the last 72 hours  Basename 09/11/11 0530  TSH 0.753  T4TOTAL --  T3FREE 5.2*  THYROIDAB --   No results found for this basename: VITAMINB12:2,FOLATE:2,FERRITIN:2,TIBC:2,IRON:2,RETICCTPCT:2 in the last 72 hours Imaging results:  Dg Chest 2 View  09/11/2011  *RADIOLOGY REPORT*  Clinical Data: Medical clearance  CHEST - 2 VIEW  Comparison: 01/15/2011  Findings: Lungs are clear. No pleural effusion or pneumothorax.  Cardiomediastinal silhouette is within normal limits.  Visualized osseous structures are within  normal limits.  IMPRESSION: Normal chest radiographs.  Original Report Authenticated By: Charline Bills, M.D.   Ct Head Wo Contrast  09/11/2011  *RADIOLOGY REPORT*  Clinical Data: Altered mental status secondary to a fall.  CT HEAD WITHOUT CONTRAST  Technique:  Contiguous axial images were obtained from the base of the skull through the vertex without contrast.  Comparison: 09/09/2011  Findings: There is no acute intracranial hemorrhage, infarction, or mass lesion.  Brain parenchyma is normal.  Osseous structures are normal.  IMPRESSION: Normal exam, unchanged.  Original Report Authenticated By: Gwynn Burly, M.D.   Ct Head Wo Contrast  09/09/2011  *RADIOLOGY REPORT*  Clinical Data: Depression and headaches.  CT HEAD WITHOUT CONTRAST  Technique:  Contiguous axial images were obtained from the base of the skull through the vertex without contrast.  Comparison: None.  Findings: No acute intracranial abnormality is present. Specifically, there is no evidence for acute infarct, hemorrhage, mass, hydrocephalus, or extra-axial fluid collection.  The paranasal sinuses and mastoid air cells are clear.  The globes and orbits are intact.  The osseous skull is intact.  IMPRESSION: Negative CT of the head.  Original Report Authenticated By: Jamesetta Orleans. MATTERN, M.D.   Impression/Plan:  Principal Problem:  *Encephalopathy acute Active Problems:  MIGRAINE, COMMON, INTRACTABLE  Schizophrenia, paranoid type  Tachycardia  Seasonal allergies  Hypokalemia   #1 acute encephalopathy Likely psychiatric in nature as patient appears catatonic,  however patient with a depressed gag reflex and concern for neurological etiology. CT of the head which was obtained was negative. Will check a MRI of the head. Will consult with neurology for further evaluation and management. Will check RPR B12 and RBC folate. Will admit to the step down unit for now monitor. We'll also consult with psychiatric for further evaluation and  recommendations.  #2 tachycardia Questionable etiology. May be secondary to Restoril which was given at [facility]. Lab work obtained is unremarkable. Patient is afebrile. Patient is not anemic. Electrolytes are with slight hypokalemia at 3.4. TSH is within normal limits. Free T3 is slightly elevated. D-dimer is within normal limits. Patient does not have a cardiac history. Workup so far has been negative. We'll cycle cardiac enzymes every 8 hours x3. Will check a 2-D echo. We'll give a dose of IV Lopressor x1. Will follow.  #3 hypokalemia Check a magnesium level. Replete.  #4 migraine headaches Stable.  #5 schizophrenia paranoid type Will discontinue patient's respiradol secondary to problem #2. Will consult with psychiatric for further evaluation and recommendations.  #6 depression Will consult with psychiatric for further evaluation and recommendations  #7 prophylaxis Lovenox for DVT prophylaxis.    Shaquoya Cosper [phone] 09/11/2011, 3:13 PM

## 2011-09-11 NOTE — ED Provider Notes (Addendum)
Pt signed out by Dr Norlene Campbell that was at [facility] for eval/tx psychosis when was noted to be tachycardic, and that labs pending, anticipate potential transfer back to [facility] w medical consult.  Reviewed ecgs, pt w sinus tachycardia. Pt w no fever/chills. No increased tone or rigidity. No dyskinesia, or other tremor/movement disorder. Pt appears calm/alert and in no acute distress. Pt w relatively little po intake today ?intake at [facility], iv ns bolus in ed. Total of 2 liters. Hr improved, sinus, rate 108.   Labs noted, ddimer normal. tsh normal, free t3 sl elevated (?significance), no free t4 done. hgb normal. k sl low, kcl po.  Po fluids, meal. Pt ambulatory to bathroom.    Will get medical consult to help facilitate eval and anticipated transfer back to [facility], whereby [facility] service would continue to be available for medical consult as need.  Discussed pt with Dr Janee Morn - they will see in ED.   On further discussions w mom, no preceding psych hx. States all symptoms began in past 1-2 weeks w becoming withdrawn, not going to work, not going to school. States would sit w her child and blankly watch cartoons all day, not take care of self, forget to eat. Denies any recent febrile illness. Denies any unusual stressors.   On recheck hr now 120-130, sinus. No fever. Pt w no new c/o. No headache. No neck stiffness/rigidity. abd soft nt. Pt w blank stare, will drool and stool on self. At other times able to ambulate to bathroom, hold her own drink/swallow fluids. However given mental status, tachycardia, feel that pt will need inpt obs/care on medical service w psych consult - discussed w [facility]/Dr Janee Morn.   Dr Janee Morn will admit and get neuro and psych consults. Requests temp orders to team 4, step down, also requests we ordered mri.     Suzi Roots, MD 09/11/11 1303  Suzi Roots, MD 09/11/11 1413  Suzi Roots, MD 09/11/11 1440

## 2011-09-11 NOTE — Progress Notes (Addendum)
VS: BP 152/89 P 172, T 98.1, 02 sat 100% 0359 BP 144/101 P 151, 02 Sat 97%

## 2011-09-11 NOTE — ED Notes (Signed)
Per EMS report, Pt from [facility] with IVC papers.  Pt is lethargic and not willfully verbal. Pt became more verbally responsive during transport to hospital.  Initially HR in the 160s but now in the 140s. Pt is an elopement risk and is with a Comptroller.

## 2011-09-11 NOTE — Progress Notes (Addendum)
Patient ID: Debra Guerrero, female   DOB: [birth year], 26 y.o.   MRN: [id number] Pt. In room lying supine, resp. Shallow at 16, BP 147/98, P 168, 02 Sat 100% Pt. Lethargic, answers I'm hot. Cold cloths applied to forehead. Pt. Still slow to respond. Secreary Called code. EMS notified. Tessa Lerner, PA notified. Bump to forehead, from where pt. reports bumping her head earlier on day shift. Pt. Hx. Ashthma, migraines. Paranoid Schizophrenia. Pt. Has not been forthright in administering any information.

## 2011-09-11 NOTE — ED Notes (Signed)
Spoke with Dr. Denton Lank regarding mother's wishes to speak with him.

## 2011-09-11 NOTE — ED Notes (Signed)
WUJ:[id number]<JY> Expected date:<BR> Expected time:<BR> Means of arrival:<BR> Comments:<BR> From [facility] 140

## 2011-09-11 NOTE — ED Notes (Signed)
Pt is not cooperative in answering questions.  Her affect is flat.  Currently pt is sitting in bed with her eyes closed.  Sitter with pt.

## 2011-09-11 NOTE — ED Notes (Signed)
To xray with sitter for chest xray

## 2011-09-11 NOTE — ED Notes (Signed)
Remains awake, alert, but nonverbal. Drooling, staring. Will follow person talking with eyes, doesn't turn head, looking up at ceiling. Mother states had been taking "MONTEL[place]AST" for asthma last week. Concerned that it may cause depression. Rx was filled in May, does not appear to have taken too many, still pills left in bottle.

## 2011-09-12 LAB — CARDIAC PANEL(CRET KIN+CKTOT+MB+TROPI)
CK, MB: 0.7 ng/mL (ref 0.3–4.0)
Relative Index: INVALID (ref 0.0–2.5)
Relative Index: INVALID (ref 0.0–2.5)
Total CK: 53 U/L (ref 7–177)
Troponin I: <0.3 ng/mL (ref <0.30)

## 2011-09-12 LAB — BASIC METABOLIC PANEL
CO2: 22 mEq/L (ref 19–32)
Chloride: 111 mEq/L (ref 96–112)
Creatinine, Ser: 0.54 mg/dL (ref 0.50–1.10)
GFR calc Af Amer: >90 mL/min (ref >90)
Potassium: 3.9 mEq/L (ref 3.5–5.1)
Sodium: 140 mEq/L (ref 135–145)

## 2011-09-12 LAB — CBC
HCT: 39.5 % (ref 36.0–46.0)
Hemoglobin: 12.9 g/dL (ref 12.0–15.0)
RBC: 4.64 MIL/uL (ref 3.87–5.11)
RDW: 13.5 % (ref 11.5–15.5)
WBC: 8.7 10*3/uL (ref 4.0–10.5)

## 2011-09-12 MED ORDER — LORAZEPAM 2 MG/ML IJ SOLN
2.0000 mg | Freq: Once | INTRAMUSCULAR | Status: AC
  Administered 2011-09-12: 2 mg via INTRAMUSCULAR
  Filled 2011-09-12 (×2): qty 1

## 2011-09-12 MED ORDER — PROPRANOLOL HCL 10 MG PO TABS
10.0000 mg | ORAL_TABLET | Freq: Two times a day (BID) | ORAL | Status: DC
  Administered 2011-09-12 – 2011-09-15 (×4): 10 mg via ORAL
  Filled 2011-09-12 (×9): qty 1

## 2011-09-12 MED ORDER — SODIUM CHLORIDE 0.9 % IV SOLN
INTRAVENOUS | Status: DC
  Administered 2011-09-12: 10:00:00 via INTRAVENOUS
  Administered 2011-09-13: 1000 mL via INTRAVENOUS
  Administered 2011-09-13 – 2011-09-16 (×4): via INTRAVENOUS

## 2011-09-12 MED ORDER — LORAZEPAM 2 MG/ML IJ SOLN
2.0000 mg | Freq: Four times a day (QID) | INTRAMUSCULAR | Status: DC
  Administered 2011-09-12 – 2011-09-22 (×14): 2 mg via INTRAMUSCULAR
  Filled 2011-09-12 (×20): qty 1

## 2011-09-12 NOTE — Progress Notes (Signed)
Subjective: Per nursing patient moving extremities and ambulating. Per nursing patient communicated and spoke with them. Per nursing patient with elevated heart rate however improved after 10 minutes. Patient looks at me however is not answering any questions. Patient states wants to leave. Patient moving all extremities spontaneously.  Objective: Vital signs in last 24 hours: Filed Vitals:   09/11/11 2000 09/12/11 0000 09/12/11 0400 09/12/11 0500  BP: 136/89 128/88 124/82   Pulse:  90 116   Temp: 98.1 F (36.7 C) 99.3 F (37.4 C) 98.3 F (36.8 C)   TempSrc: Oral Oral Oral   Resp:  12 13   Height: 5\' 6"  (1.676 m)     Weight: 81.8 kg (180 lb 5.4 oz)   83.2 kg (183 lb 6.8 oz)  SpO2:  99% 99%     Intake/Output Summary (Last 24 hours) at 09/12/11 0938 Last data filed at 09/12/11 0700  Gross per 24 hour  Intake   1378 ml  Output   1230 ml  Net    148 ml    Weight change:   General: Alert, awake, in no acute distress. HEENT: No bruits, no goiter. Heart: Tachycardia, without murmurs, rubs, gallops. Lungs: Clear to auscultation bilaterally in anterior lung fields. Abdomen: Patient refused Extremities: Patient refused Neuro: Patient refused.    Lab Results:  Basename 09/12/11 0358 09/11/11 1948 09/11/11 1944 09/11/11 1600 09/11/11 0530  NA 140 -- -- -- 140  K 3.9 -- -- -- 3.4*  CL 111 -- -- -- 103  CO2 22 -- -- -- 24  GLUCOSE 85 -- -- -- 103*  BUN 5* -- -- -- 6  CREATININE 0.54 -- 0.54 -- --  CALCIUM 8.8 -- -- -- 9.6  MG -- 2.0 -- 2.1 --  PHOS -- -- -- -- --    Basename 09/09/11 1255  AST 18  ALT 15  ALKPHOS 92  BILITOT 0.5  PROT 8.1  ALBUMIN 4.2   No results found for this basename: LIPASE:2,AMYLASE:2 in the last 72 hours  Basename 09/12/11 0358 09/11/11 1944  WBC 8.7 8.5  NEUTROABS -- --  HGB 12.9 13.2  HCT 39.5 39.9  MCV 85.1 84.0  PLT 250 274    Basename 09/12/11 0358 09/11/11 1951  CKTOTAL 54 56  CKMB 0.6 0.8  CKMBINDEX -- --  TROPONINI <0.30  <0.30   No components found with this basename: POCBNP:3  Basename 09/11/11 0845  DDIMER 0.48   No results found for this basename: HGBA1C:2 in the last 72 hours No results found for this basename: CHOL:2,HDL:2,LDLCALC:2,TRIG:2,CHOLHDL:2,LDLDIRECT:2 in the last 72 hours  Basename 09/11/11 0530  TSH 0.753  T4TOTAL --  T3FREE 5.2*  THYROIDAB --    Basename 09/11/11 1630  VITAMINB12 1703*  FOLATE --  FERRITIN --  TIBC --  IRON --  RETICCTPCT --    Micro Results: Recent Results (from the past 240 hour(s))  MRSA PCR SCREENING     Status: Normal   Collection Time   09/11/11  8:21 PM      Component Value Range Status Comment   MRSA by PCR NEGATIVE  NEGATIVE Final     Studies/Results: Dg Chest 2 View  09/11/2011  *RADIOLOGY REPORT*  Clinical Data: Medical clearance  CHEST - 2 VIEW  Comparison: 01/15/2011  Findings: Lungs are clear. No pleural effusion or pneumothorax.  Cardiomediastinal silhouette is within normal limits.  Visualized osseous structures are within normal limits.  IMPRESSION: Normal chest radiographs.  Original Report Authenticated By: Charline Bills, M.D.  Ct Head Wo Contrast  09/11/2011  *RADIOLOGY REPORT*  Clinical Data: Altered mental status secondary to a fall.  CT HEAD WITHOUT CONTRAST  Technique:  Contiguous axial images were obtained from the base of the skull through the vertex without contrast.  Comparison: 09/09/2011  Findings: There is no acute intracranial hemorrhage, infarction, or mass lesion.  Brain parenchyma is normal.  Osseous structures are normal.  IMPRESSION: Normal exam, unchanged.  Original Report Authenticated By: Gwynn Burly, M.D.   Mr Brain Wo Contrast  09/11/2011  *RADIOLOGY REPORT*  Clinical Data: Head trauma.  Mental status changes.  MRI HEAD WITHOUT CONTRAST  Technique:  Multiplanar, multiecho pulse sequences of the brain and surrounding structures were obtained according to standard protocol without intravenous contrast.   Comparison: Head CT 09/11/2011 and 09/09/2011  Findings: The brain has a normal appearance on all pulse sequences without evidence of malformation, atrophy, old or acute infarction, mass lesion, hemorrhage, hydrocephalus or extra-axial collection. No pituitary mass.  No inflammatory sinus disease.  No skull or skull base lesion.  IMPRESSION: Normal MRI of the brain.  Original Report Authenticated By: Thomasenia Sales, M.D.    Medications:     . enoxaparin (LOVENOX) injection  40 mg Subcutaneous Q24H  . propranolol  10 mg Oral BID  . sodium chloride  3 mL Intravenous Q12H  . DISCONTD: metoprolol  5 mg Intravenous Once    Assessment: Principal Problem:  *Encephalopathy acute Active Problems:  MIGRAINE, COMMON, INTRACTABLE  Schizophrenia, paranoid type  Tachycardia  Seasonal allergies  Hypokalemia  Altered mental status   Plan: #1 acute encephalopathy Patient is alert and moving all extremities spontaneously. Was likely a catatonic state and psychiatrically mediated. MRI of the head was negative. CT of the head was negative. Patient has been seen by neurology and feel all secondary to a psychiatric issues. Per neurology no further workup needed. Psych consultation is pending.  #2 tachycardia EKG with a sinus tachycardia. Patient with no signs or symptoms of infection. Patient is not anemic. D-dimer is negative. TSH is within normal limits. Free T4 is within normal limits. Chest x-ray is negative. Magnesium was within normal limits. Potassium has been repleted. 2-D echo is pending. Will place patient on propranolol 10 mg by mouth twice a day. Respiradal discontinued yesterday however patient still with some tachycardia. No further workup is needed at this time.  #3 hypokalemia Repleted.  #4 paranoid type schizophrenia/depression Respiradal was discontinued yesterday secondary to problem #2. A psychiatric consultation is pending. Patient likely needs inpatient psychiatry.  #5  prophylaxis Lovenox for DVT prophylaxis.    LOS: 1 day   THOMPSON,DANIEL [phone] 09/12/2011, 9:38 AM

## 2011-09-12 NOTE — Progress Notes (Signed)
  Echocardiogram 2D Echocardiogram has been performed.  Debra Guerrero 09/12/2011, 8:36 AM

## 2011-09-12 NOTE — Progress Notes (Signed)
Pt HR increased to 180's however returned to baseline <3min, E-LINK, MD called no new orders at this time.  Pt shows no signs of distress.  EKG faxed to [facility]. Will continue to monitor and report. Conley Rolls

## 2011-09-12 NOTE — Consult Note (Signed)
Reason for Consult: "Catatonic, wants to rule out neuro issues." Referring Physician: Dr. Benedict Needy Josefa Half is an 26 y.o. female.   HPI: Pt presented to [facility] with c/o depression and SI.  Per Dr. Allena Katz, was admitted to [facility] and started on Risperdal for further complaint of psychotic s/s.  Prn Haldol was initiated as well.  After developing tachycardia, pt was referred to the ED after which she was admitted for medical stabilization and possible catatonia.  Pt was then diagnosed with hypokalemia which was repleted, acute encephalopathy and tachycardia.  Per medical team, pt went from moving all extremities spontaneously to not being able to ambulate or talk. Team was concerned that some of her presentation was volitional, but pt did appear to be in a catatonic state.  Pt stated that she was feeling "not good."  She said she was not able to walk and wanted to "sit up".  She denied using any alcohol, illicit drugs or ODing.  She was laying in the bed, staring at the wall and answering questions with limited short answers.    MSE: Patient stated that her mood was "not good". Her affect was mood congruent and flat. She denied any current thoughts of self injurious behavior, suicidal ideation or homicidal ideation.  Sitter at bedside. She answered questions with yes/no answers and denied any current AVH. Sig psychomotor retardation was noted.  Sitter said that the involuntary movements stopped and pt has been able to communicate briefly and now follow simple commands. Eye contact was poor. Judgment and insight are limited.     Past Medical History  Diagnosis Date  . Asthma   . Headache   . Depression   . Seasonal allergies 09/11/2011    History reviewed. No pertinent past surgical history.  History reviewed. No pertinent family history.  Social History:  reports that she has quit smoking. Her smoking use included Cigarettes. She has never used smokeless tobacco. She reports that she does  not drink alcohol or use illicit drugs.  UDS neg.  Report of recent separation; mother supportive.  "Sitter doesn't bother me." Pt homeless per her report, but mother is very supportive per nursing team.  Allergies: No Known Allergies  Medications:     . enoxaparin (LOVENOX) injection  40 mg Subcutaneous Q24H  . LORazepam  2 mg Intramuscular Once  . propranolol  10 mg Oral BID  . sodium chloride  3 mL Intravenous Q12H  . DISCONTD: metoprolol  5 mg Intravenous Once    Results for orders placed during the hospital encounter of 09/11/11 (from the past 48 hour(s))  RPR     Status: Abnormal   Collection Time   09/11/11  4:30 PM      Component Value Range Comment   RPR Reactive (*) NON REACTIVE   VITAMIN B12     Status: Abnormal   Collection Time   09/11/11  4:30 PM      Component Value Range Comment   Vitamin B-12 1703 (*) 211 - 911 pg/mL   RPR TITER     Status: Normal   Collection Time   09/11/11  4:30 PM      Component Value Range Comment   RPR Titer 1:1  NON REACTIVE   CBC     Status: Normal   Collection Time   09/11/11  7:44 PM      Component Value Range Comment   WBC 8.5  4.0 - 10.5 K/uL    RBC 4.75  3.87 - 5.11 MIL/uL    Hemoglobin 13.2  12.0 - 15.0 g/dL    HCT 53.6  64.4 - 03.4 %    MCV 84.0  78.0 - 100.0 fL    MCH 27.8  26.0 - 34.0 pg    MCHC 33.1  30.0 - 36.0 g/dL    RDW 74.2  59.5 - 63.8 %    Platelets 274  150 - 400 K/uL   CREATININE, SERUM     Status: Normal   Collection Time   09/11/11  7:44 PM      Component Value Range Comment   Creatinine, Ser 0.54  0.50 - 1.10 mg/dL    GFR calc non Af Amer >90  >90 mL/min    GFR calc Af Amer >90  >90 mL/min   T4, FREE     Status: Normal   Collection Time   09/11/11  7:44 PM      Component Value Range Comment   Free T4 1.69  0.80 - 1.80 ng/dL   MAGNESIUM     Status: Normal   Collection Time   09/11/11  7:48 PM      Component Value Range Comment   Magnesium 2.0  1.5 - 2.5 mg/dL   CARDIAC PANEL(CRET KIN+CKTOT+MB+TROPI)      Status: Normal   Collection Time   09/11/11  7:51 PM      Component Value Range Comment   Total CK 56  7 - 177 U/L    CK, MB 0.8  0.3 - 4.0 ng/mL    Troponin I <0.30  <0.30 ng/mL    Relative Index RELATIVE INDEX IS INVALID  0.0 - 2.5   MRSA PCR SCREENING     Status: Normal   Collection Time   09/11/11  8:21 PM      Component Value Range Comment   MRSA by PCR NEGATIVE  NEGATIVE   CARDIAC PANEL(CRET KIN+CKTOT+MB+TROPI)     Status: Normal   Collection Time   09/12/11  3:58 AM      Component Value Range Comment   Total CK 54  7 - 177 U/L    CK, MB 0.6  0.3 - 4.0 ng/mL    Troponin I <0.30  <0.30 ng/mL    Relative Index RELATIVE INDEX IS INVALID  0.0 - 2.5   BASIC METABOLIC PANEL     Status: Abnormal   Collection Time   09/12/11  3:58 AM      Component Value Range Comment   Sodium 140  135 - 145 mEq/L    Potassium 3.9  3.5 - 5.1 mEq/L    Chloride 111  96 - 112 mEq/L    CO2 22  19 - 32 mEq/L    Glucose, Bld 85  70 - 99 mg/dL    BUN 5 (*) 6 - 23 mg/dL    Creatinine, Ser 7.56  0.50 - 1.10 mg/dL    Calcium 8.8  8.4 - 43.3 mg/dL    GFR calc non Af Amer >90  >90 mL/min    GFR calc Af Amer >90  >90 mL/min   CBC     Status: Normal   Collection Time   09/12/11  3:58 AM      Component Value Range Comment   WBC 8.7  4.0 - 10.5 K/uL    RBC 4.64  3.87 - 5.11 MIL/uL    Hemoglobin 12.9  12.0 - 15.0 g/dL    HCT 29.5  18.8 - 41.6 %  MCV 85.1  78.0 - 100.0 fL    MCH 27.8  26.0 - 34.0 pg    MCHC 32.7  30.0 - 36.0 g/dL    RDW 45.4  09.8 - 11.9 %    Platelets 250  150 - 400 K/uL   CARDIAC PANEL(CRET KIN+CKTOT+MB+TROPI)     Status: Normal   Collection Time   09/12/11 11:09 AM      Component Value Range Comment   Total CK 53  7 - 177 U/L    CK, MB 0.7  0.3 - 4.0 ng/mL    Troponin I <0.30  <0.30 ng/mL    Relative Index RELATIVE INDEX IS INVALID  0.0 - 2.5     Dg Chest 2 View  09/11/2011  *RADIOLOGY REPORT*  Clinical Data: Medical clearance  CHEST - 2 VIEW  Comparison: 01/15/2011  Findings: Lungs  are clear. No pleural effusion or pneumothorax.  Cardiomediastinal silhouette is within normal limits.  Visualized osseous structures are within normal limits.  IMPRESSION: Normal chest radiographs.  Original Report Authenticated By: Charline Bills, M.D.   Ct Head Wo Contrast  09/11/2011  *RADIOLOGY REPORT*  Clinical Data: Altered mental status secondary to a fall.  CT HEAD WITHOUT CONTRAST  Technique:  Contiguous axial images were obtained from the base of the skull through the vertex without contrast.  Comparison: 09/09/2011  Findings: There is no acute intracranial hemorrhage, infarction, or mass lesion.  Brain parenchyma is normal.  Osseous structures are normal.  IMPRESSION: Normal exam, unchanged.  Original Report Authenticated By: Gwynn Burly, M.D.   Mr Brain Wo Contrast  09/11/2011  *RADIOLOGY REPORT*  Clinical Data: Head trauma.  Mental status changes.  MRI HEAD WITHOUT CONTRAST  Technique:  Multiplanar, multiecho pulse sequences of the brain and surrounding structures were obtained according to standard protocol without intravenous contrast.  Comparison: Head CT 09/11/2011 and 09/09/2011  Findings: The brain has a normal appearance on all pulse sequences without evidence of malformation, atrophy, old or acute infarction, mass lesion, hemorrhage, hydrocephalus or extra-axial collection. No pituitary mass.  No inflammatory sinus disease.  No skull or skull base lesion.  IMPRESSION: Normal MRI of the brain.  Original Report Authenticated By: Thomasenia Sales, M.D.    Review of Systems  Neurological: Positive for dizziness, sensory change and weakness.  Psychiatric/Behavioral: Positive for depression.   Blood pressure 124/84, pulse 84, temperature 99 F (37.2 C), temperature source Oral, resp. rate 15, height 5\' 6"  (1.676 m), weight 83.2 kg (183 lb 6.8 oz), last menstrual period 09/09/2011, SpO2 98.00%.  Physical Exam per primary team.  MSE as noted above s/p HPI.  Administered 2mg  IM  lorazepam s/p evaluating the pt which, per the nursing staff, made the following improvements: "increased communication, wanted to sit up and watch TV and was more interactive".  Assessment/Plan: MDD, per PHx; r/o Mood and Psychotic Disorders once MS improves; Catatonia; Hypokalemia, resolved; Sinus Tachycardia, resolved; r/o Syphilis; r/o recent trauma/complicated grief; r/o Conversion Disorder  1. Already gave lorazepam 2mg  IM once earlier today with positive response. Standing dose ordered at this time with downward titration recommended s/p further stabilization. 2. Confirmatory testing for syphilis and other STDs should be completed. 3. Inpatient hospitalization should be continued s/p medical clearance.  At this time, pt is not eating nor able to function independently.  Consider placement in state system if necessary.  Pt was refusing medications while at [facility] and should reevaluated for readmission pending acuity. 4. Continue sitter at this time for safety.  5. Pt afebrile and CK level wnl. Concern regarding NMS not likely.  CT/MRI unremarkable as well. 6. Discussed with medical team, nursing staff on the unit and Dr. Allena Katz.  Psychiatry will continue to follow.  Thank you for the consult.  Kuroski-Mazzei, Evadne Ose 09/12/2011, 8:16 PM

## 2011-09-13 LAB — BASIC METABOLIC PANEL
BUN: 9 mg/dL (ref 6–23)
Calcium: 9.1 mg/dL (ref 8.4–10.5)
Chloride: 103 mEq/L (ref 96–112)
Creatinine, Ser: 0.65 mg/dL (ref 0.50–1.10)
GFR calc Af Amer: >90 mL/min (ref >90)

## 2011-09-13 LAB — VITAMIN B12: Vitamin B-12: 1338 pg/mL — ABNORMAL HIGH (ref 211–911)

## 2011-09-13 MED ORDER — HALOPERIDOL LACTATE 5 MG/ML IJ SOLN: 5.0000 mg | Freq: Three times a day (TID) | INTRAMUSCULAR | Status: DC | PRN

## 2011-09-13 MED ORDER — HALOPERIDOL LACTATE 5 MG/ML IJ SOLN
5.0000 mg | Freq: Once | INTRAMUSCULAR | Status: AC
  Administered 2011-09-13: 5 mg via INTRAVENOUS
  Filled 2011-09-13: qty 1

## 2011-09-13 NOTE — Progress Notes (Signed)
Called to room by sitter-patient trying to choke herself with one hand-stated to her that she could not do that and must keep her hands down by her side. Left the room and was called back immediately- found with one hand around her neck again. Stated once again that she must keep her hands by her side, that it was our responsibility to keep her safe and if she put her hands around her neck again we would have to restrain her. Left the room only to be called back immediately-again found with both hands around her neck. Dr.Thompson notified and wrist and ankle restraints applied. Given IM Ativan and security and GPP presently at bedside. Continues to try and remove wrist restraints, fighting and biting, spitting. Face mask applied to prevent spitting, trying to bite staff.

## 2011-09-13 NOTE — Progress Notes (Signed)
Pt HR increased to 140's nonsustained, [facility] MD informed.  No new orders received at this time.  Pt will transfer to telemetry room 1513 and continue to be on a heart monitor. Sitter will remain at pt bedside, per protocol. Conley Rolls

## 2011-09-13 NOTE — Progress Notes (Signed)
Subjective: Per nursing patient moving extremities and ambulating. Patient very combative and agitated today. Per nursing patient tried to strangle herself and security had to come in to hold patient now. Patient also tried to bite and is spitting and is fighting. Patient currently has 4 point restraints on. In the mask on her face as she was bending and trying to bite the police. When asking the patient how she's doing patient states she wants to kill herself. When asking the patient whether she would allow me to examine her patient states " do not touch me."  Objective: Vital signs in last 24 hours: Filed Vitals:   09/13/11 0000 09/13/11 0344 09/13/11 0646 09/13/11 1406  BP: 125/92 126/85 125/92 120/80  Pulse:  96 130 96  Temp: 99.6 F (37.6 C) 98.7 F (37.1 C) 98.6 F (37 C) 98.4 F (36.9 C)  TempSrc: Oral Oral Oral Oral  Resp:  16 17 16   Height:  5\' 6"  (1.676 m)    Weight:  81.2 kg (179 lb 0.2 oz)    SpO2:  97% 97% 99%    Intake/Output Summary (Last 24 hours) at 09/13/11 1711 Last data filed at 09/13/11 1407  Gross per 24 hour  Intake   1370 ml  Output   1750 ml  Net   -380 ml    Weight change: -0.6 kg (-1 lb 5.2 oz)  General: Combative in four point. restraints stating she wants to kill herself. HEENT: Patient refused Heart: Patient refused Lungs: Patient refused Abdomen: Patient refused Extremities patient refused Neuro: Patient refused.    Lab Results:  Basename 09/13/11 0545 09/12/11 0358 09/11/11 1948 09/11/11 1600  NA 137 140 -- --  K 3.9 3.9 -- --  CL 103 111 -- --  CO2 24 22 -- --  GLUCOSE 92 85 -- --  BUN 9 5* -- --  CREATININE 0.65 0.54 -- --  CALCIUM 9.1 8.8 -- --  MG -- -- 2.0 2.1  PHOS -- -- -- --   No results found for this basename: AST:2,ALT:2,ALKPHOS:2,BILITOT:2,PROT:2,ALBUMIN:2 in the last 72 hours No results found for this basename: LIPASE:2,AMYLASE:2 in the last 72 hours  Basename 09/12/11 0358 09/11/11 1944  WBC 8.7 8.5  NEUTROABS  -- --  HGB 12.9 13.2  HCT 39.5 39.9  MCV 85.1 84.0  PLT 250 274    Basename 09/12/11 1109 09/12/11 0358 09/11/11 1951  CKTOTAL 53 54 56  CKMB 0.7 0.6 0.8  CKMBINDEX -- -- --  TROPONINI <0.30 <0.30 <0.30   No components found with this basename: POCBNP:3  Basename 09/11/11 0845  DDIMER 0.48   No results found for this basename: HGBA1C:2 in the last 72 hours No results found for this basename: CHOL:2,HDL:2,LDLCALC:2,TRIG:2,CHOLHDL:2,LDLDIRECT:2 in the last 72 hours  Basename 09/11/11 0530  TSH 0.753  T4TOTAL --  T3FREE 5.2*  THYROIDAB --    Basename 09/13/11 0926 09/11/11 1630  VITAMINB12 1338* 1703*  FOLATE -- --  FERRITIN -- --  TIBC -- --  IRON -- --  RETICCTPCT -- --    Micro Results: Recent Results (from the past 240 hour(s))  MRSA PCR SCREENING     Status: Normal   Collection Time   09/11/11  8:21 PM      Component Value Range Status Comment   MRSA by PCR NEGATIVE  NEGATIVE Final     Studies/Results: No results found.  Medications:     . enoxaparin (LOVENOX) injection  40 mg Subcutaneous Q24H  . haloperidol lactate  5  mg Intravenous Once  . LORazepam  2 mg Intramuscular Once  . LORazepam  2 mg Intramuscular Q6H  . propranolol  10 mg Oral BID  . sodium chloride  3 mL Intravenous Q12H    Assessment: Principal Problem:  *Encephalopathy acute Active Problems:  MIGRAINE, COMMON, INTRACTABLE  Schizophrenia, paranoid type  Tachycardia  Seasonal allergies  Hypokalemia  Altered mental status   Plan: #1 acute encephalopathy Patient is alert and moving all extremities spontaneously. Was likely a catatonic state and psychiatrically mediated. Patient is very agitated and combative today. Patient states she wants to kill herself. Patient had to be held down by staff and security. Patient now in restraints. MRI of the head was negative. CT of the head was negative. Patient has been seen by neurology and feel all secondary to a psychiatric issues. Per  neurology no further workup needed. RPR is reactive however confirmatory test and is pending. Doubt if patient has tertiary syphilis causing her symptoms. HIV antibody is pending. Urine GC Chlamydia is pending. Psych is following. Patient very combative was given some IM Ativan however patient with increased agitation. Will give patient a dose of Haldol 5 mg IV x1 now. Psychiatry is following and further recommendations per psychiatry.  #2 tachycardia EKG with a sinus tachycardia. Patient with no signs or symptoms of infection. Patient is not anemic. D-dimer is negative. TSH is within normal limits. Free T4 is within normal limits. Chest x-ray is negative. Magnesium was within normal limits. Potassium has been repleted. 2-D echo is pending. Continue propranolol 10 mg by mouth twice a day. Respiradal discontinued yesterday however patient still with some tachycardia. No further workup is needed at this time.  #3 hypokalemia Repleted.  #4 paranoid type schizophrenia/depression Respiradal was discontinued yesterday secondary to problem #2. Patient is very combative and agitated trying to strangle herself in the bed states she wants to kill herself. Patient was given some IM Ativan however agitation and combativeness has worsened such that she had to be placed in restraints. A mask to be placed on a face and she was trying to bite the staff and security. With the patient a dose of Haldol 5 mg IV x1. Psychiatry is following and further recommendations per psychiatry.  #5 prophylaxis Lovenox for DVT prophylaxis.    LOS: 2 days   Debra Guerrero,Debra Guerrero [phone] 09/13/2011, 5:11 PM

## 2011-09-13 NOTE — Progress Notes (Signed)
Patient was transferred from ICU this morning with a foley catheter. There is no indication for the catheter. May we discontinue foley?

## 2011-09-13 NOTE — Progress Notes (Signed)
Upon arrival to the unit patient stated that she could not move or walk but was able to transfer herself from one bed to another unassisted. She then became very upset when asked if she could help move herself up in bed stating " I told you I can not move". She then moved herself up in bed. She denied pain stating " I can't feel anything". She stated that she did not have a heart when it was explained that she would be on telemetry. The patient was asked why she had not had anything to eat or drink for the last 24 hours and she pointed at her mouth stating, " don't you see this hole in my mouth, I can't swallow". The patient drank 360cc of water and ate the ice when it was given to her. She also verbalized pain and cried after receiving her schedule IM ativan. She became very upset about receiving IV fluids and attempted to pull her IV out. She was stopped by staff and advised that she could harm herself and may be restrained to keep her safe. She calmed down after additional staff entered the room to assist if needed. Sitter present at the bedside. Staff will continue to monitor and assess patient's mental status.

## 2011-09-14 DIAGNOSIS — R4182 Altered mental status, unspecified: Secondary | ICD-10-CM

## 2011-09-14 LAB — BASIC METABOLIC PANEL
BUN: 7 mg/dL (ref 6–23)
Chloride: 105 mEq/L (ref 96–112)
GFR calc Af Amer: >90 mL/min (ref >90)
Potassium: 3.5 mEq/L (ref 3.5–5.1)

## 2011-09-14 LAB — FOLATE RBC: RBC Folate: 377 ng/mL (ref >=366)

## 2011-09-14 MED ORDER — BENZTROPINE MESYLATE 1 MG/ML IJ SOLN
1.0000 mg | Freq: Two times a day (BID) | INTRAMUSCULAR | Status: DC
  Administered 2011-09-14 – 2011-09-15 (×3): 1 mg via INTRAVENOUS
  Filled 2011-09-14 (×4): qty 1

## 2011-09-14 MED ORDER — POTASSIUM CHLORIDE CRYS ER 20 MEQ PO TBCR
40.0000 meq | EXTENDED_RELEASE_TABLET | Freq: Once | ORAL | Status: DC
  Filled 2011-09-14: qty 2

## 2011-09-14 NOTE — Consult Note (Signed)
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 She had 1:64 RPR/TPPA+ and was treated with doxy for 14 days beginning 10-23-04. There is no indication for further treatment.  Here positive RPR is low level and is an indication of her previous infection. She has been adequately treated. Discussed with the State Communicable Disease Branch.   Please call if questions, Thank you so much for this interesting consult,   Johny Sax [phone]  Debra Guerrero is an 26 y.o. female.  HPI: 26 yo F with hx of paranoid schizophrenia admitted 7-6 and felt to be in catatonic state (? Grief reaction). She was evaluated by psychiatry and improved significantly with benzodiazepine. She had MRI of head (-) as well. In hospital she has been agitated, fighting with staff and required restraints. She has also voiced suicidal ideation.  As part of her work up she had RPR 1:1, HIV Ab (-)  Past Medical History  Diagnosis Date  . Asthma   . Headache   . Depression   . Seasonal allergies 09/11/2011    History reviewed. No pertinent past surgical history. Allergies:   No Known Allergies  Medications:  Scheduled:   . benztropine mesylate  1 mg Intravenous BID  . enoxaparin (LOVENOX) injection  40 mg Subcutaneous Q24H  . LORazepam  2 mg Intramuscular Q6H  . potassium chloride  40 mEq Oral Once  . propranolol  10 mg Oral BID  . sodium chloride  3 mL Intravenous Q12H    Social History:  reports that she has quit smoking. Her smoking use included Cigarettes. She has never used smokeless tobacco. She reports that she does not drink alcohol or use illicit drugs.  History reviewed. No pertinent family history.  General ROS: pt responds poorly- answeres few questions.  States she has been passing urine and bowels normally in the bed. She denies vision changes, headaches.   Blood pressure 126/89, pulse 110, temperature 99 F (37.2 C), temperature source Oral, resp. rate 16, height 5\' 6"  (1.676 m), weight 82.9 kg (182 lb 12.2 oz), last menstrual period 09/09/2011, SpO2 97.00%. General appearance: alert, no distress and uncooperative Eyes: negative findings: PERL Throat: lips normal, pt opens mouth minimally Neck: no adenopathy Lungs: clear to auscultation bilaterally Heart: regular rate and rhythm Abdomen: normal findings: bowel sounds normal and soft, non-tender Extremities: edema none Skin: Skin color, texture, turgor normal. No rashes or lesions on feet Neuro- babinski is downward B,  Pulses are 3+ dorsalis pedis.    Results for orders placed during the hospital encounter of 09/11/11 (from the past 48 hour(s))  BASIC METABOLIC PANEL     Status: Normal   Collection Time   09/13/11  5:45 AM      Component Value Range Comment   Sodium 137  135 - 145 mEq/L    Potassium 3.9  3.5 - 5.1 mEq/L    Chloride 103  96 - 112 mEq/L    CO2 24  19 - 32 mEq/L    Glucose, Bld 92  70 - 99 mg/dL    BUN 9  6 - 23 mg/dL    Creatinine, Ser 4.54  0.50 - 1.10 mg/dL    Calcium 9.1  8.4 - 09.8 mg/dL    GFR calc non Af Amer >90  >90 mL/min  GFR calc Af Amer >90  >90 mL/min   HIV ANTIBODY (ROUTINE TESTING)     Status: Normal   Collection Time   09/13/11  9:26 AM      Component Value Range Comment   HIV NON REACTIVE  NON REACTIVE   FOLATE RBC     Status: Normal   Collection Time   09/13/11  9:26 AM      Component Value Range Comment   RBC Folate 377  >=366 ng/mL Reference range not established for pediatric patients.  VITAMIN B12     Status: Abnormal   Collection Time   09/13/11  9:26 AM      Component Value Range Comment   Vitamin B-12 1338 (*) 211 - 911 pg/mL   GC/CHLAMYDIA PROBE AMP, URINE     Status: Normal   Collection Time   09/13/11  9:27 AM      Component Value  Range Comment   GC Probe Amp, Urine NEGATIVE  NEGATIVE    Chlamydia, Swab/Urine, PCR NEGATIVE  NEGATIVE   BASIC METABOLIC PANEL     Status: Normal   Collection Time   09/14/11  4:43 AM      Component Value Range Comment   Sodium 137  135 - 145 mEq/L    Potassium 3.5  3.5 - 5.1 mEq/L    Chloride 105  96 - 112 mEq/L    CO2 22  19 - 32 mEq/L    Glucose, Bld 79  70 - 99 mg/dL    BUN 7  6 - 23 mg/dL    Creatinine, Ser 4.09  0.50 - 1.10 mg/dL    Calcium 8.7  8.4 - 81.1 mg/dL    GFR calc non Af Amer >90  >90 mL/min    GFR calc Af Amer >90  >90 mL/min    No results found for this basename: sdes, specrequest, cult, reptstatus   No results found. Recent Results (from the past 240 hour(s))  MRSA PCR SCREENING     Status: Normal   Collection Time   09/11/11  8:21 PM      Component Value Range Status Comment   MRSA by PCR NEGATIVE  NEGATIVE Final       09/14/2011, 4:21 PM     LOS: 3 days

## 2011-09-14 NOTE — Progress Notes (Signed)
Minimal amount of urine noted in collection bag.   Bladder scan shows 727cc.   Catheter advanced w/ show of urine.. Balloon refilled w/ 3cc to make 10cc.

## 2011-09-14 NOTE — Progress Notes (Signed)
Psych CSW attempted to Ax Pt.  Pt not able to effectively communicate.  Pt did shake her head "no" when asked if CSW could contact family.  CSW to continue to follow.  Providence Crosby, LCSWA Clinical Social Work [phone]

## 2011-09-14 NOTE — Progress Notes (Signed)
Subjective: Pt not talking. Patient motion in that she has trouble with swallowing and states her mouth is dry. Patient is able to move her legs and move her arms. Patient somewhat catatonic. Patient refusing physical exam.  Objective: Vital signs in last 24 hours: Filed Vitals:   09/13/11 0646 09/13/11 1406 09/14/11 0600 09/14/11 1347  BP: 125/92 120/80 137/95 126/89  Pulse: 130 96 115 110  Temp: 98.6 F (37 C) 98.4 F (36.9 C) 98.9 F (37.2 C) 99 F (37.2 C)  TempSrc: Oral Oral Oral Oral  Resp: 17 16 18 16   Height:      Weight:   82.9 kg (182 lb 12.2 oz)   SpO2: 97% 99% 99% 97%    Intake/Output Summary (Last 24 hours) at 09/14/11 1730 Last data filed at 09/14/11 1400  Gross per 24 hour  Intake   1575 ml  Output   1055 ml  Net    520 ml    Weight change: 1.7 kg (3 lb 12 oz)  General: Laying in bed. Able to wiggle her feet. Able to move her arms. Moving eyes from left to right and barely speaking. HEENT: Patient refused Heart: Patient refused Lungs: Patient refused Abdomen: Patient refused Extremities patient refused Neuro: Patient refused.    Lab Results:  Basename 09/14/11 0443 09/13/11 0545 09/11/11 1948  NA 137 137 --  K 3.5 3.9 --  CL 105 103 --  CO2 22 24 --  GLUCOSE 79 92 --  BUN 7 9 --  CREATININE 0.56 0.65 --  CALCIUM 8.7 9.1 --  MG -- -- 2.0  PHOS -- -- --   No results found for this basename: AST:2,ALT:2,ALKPHOS:2,BILITOT:2,PROT:2,ALBUMIN:2 in the last 72 hours No results found for this basename: LIPASE:2,AMYLASE:2 in the last 72 hours  Basename 09/12/11 0358 09/11/11 1944  WBC 8.7 8.5  NEUTROABS -- --  HGB 12.9 13.2  HCT 39.5 39.9  MCV 85.1 84.0  PLT 250 274    Basename 09/12/11 1109 09/12/11 0358 09/11/11 1951  CKTOTAL 53 54 56  CKMB 0.7 0.6 0.8  CKMBINDEX -- -- --  TROPONINI <0.30 <0.30 <0.30   No components found with this basename: POCBNP:3 No results found for this basename: DDIMER:2 in the last 72 hours No results found for  this basename: HGBA1C:2 in the last 72 hours No results found for this basename: CHOL:2,HDL:2,LDLCALC:2,TRIG:2,CHOLHDL:2,LDLDIRECT:2 in the last 72 hours No results found for this basename: TSH,T4TOTAL,FREET3,T3FREE,THYROIDAB in the last 72 hours  Basename 09/13/11 0926  VITAMINB12 1338*  FOLATE --  FERRITIN --  TIBC --  IRON --  RETICCTPCT --    Micro Results: Recent Results (from the past 240 hour(s))  MRSA PCR SCREENING     Status: Normal   Collection Time   09/11/11  8:21 PM      Component Value Range Status Comment   MRSA by PCR NEGATIVE  NEGATIVE Final     Studies/Results: No results found.  Medications:     . benztropine mesylate  1 mg Intravenous BID  . enoxaparin (LOVENOX) injection  40 mg Subcutaneous Q24H  . LORazepam  2 mg Intramuscular Q6H  . potassium chloride  40 mEq Oral Once  . propranolol  10 mg Oral BID  . sodium chloride  3 mL Intravenous Q12H    Assessment: Principal Problem:  *Encephalopathy acute Active Problems:  MIGRAINE, COMMON, INTRACTABLE  Schizophrenia, paranoid type  Tachycardia  Seasonal allergies  Hypokalemia  Altered mental status   Plan: #1 acute encephalopathy Patient is  alert and moving all extremities minimally. Likely in  a catatonic state and psychiatrically mediated. Patient is less agitated and combative today. Patient states she wants to kill herself yesterday. Patient had to be held down by staff and security yesterday. Patient now in restraints. MRI of the head was negative. CT of the head was negative. Patient has been seen by neurology and feel all secondary to a psychiatric issues. Per neurology no further workup needed. RPR is reactive however confirmatory test and is pending. Doubt if patient has tertiary syphilis causing her symptoms. HIV antibody is negative. Urine GC Chlamydia is negative. Psych is following. Patient has been seen by Dr. Ninetta Lights of infectious disease who states that patient was previously treated with  doxycycline 10/23/2004. Also states had a positive R. PR is low-level and an indication of a prior infection. His father patient has already been adequately treated and no further indication for further treatment at this time. Haldol 5 mg IV prn. We'll also place patient on Cogentin 1 mg IV twice a day per psych recommendations. Psychiatry is following and further recommendations per psychiatry.  #2 tachycardia EKG with a sinus tachycardia. Patient with no signs or symptoms of infection. Patient is not anemic. D-dimer is negative. TSH is within normal limits. Free T4 is within normal limits. Chest x-ray is negative. Magnesium was within normal limits. Potassium has been repleted. 2-D echo is pending. Continue propranolol 10 mg by mouth twice a day. Respiradal discontinued  however patient still with some tachycardia. No further workup is needed at this time.  #3 hypokalemia Repleted.  #4 paranoid type schizophrenia/depression Respiradal was discontinued  secondary to problem #2. Patient is less combative and agitated. Haldol 5 mg IV PRN. We'll also place on Cogentin 1 mg IV twice a day per psych recommendations. Psychiatry is following and further recommendations per psychiatry.  #5 prophylaxis Lovenox for DVT prophylaxis.    LOS: 3 days   Debora Stockdale [phone] 09/14/2011, 5:30 PM

## 2011-09-15 ENCOUNTER — Encounter (HOSPITAL_COMMUNITY): Payer: Self-pay | Admitting: Internal Medicine

## 2011-09-15 DIAGNOSIS — F202 Catatonic schizophrenia: Secondary | ICD-10-CM

## 2011-09-15 LAB — BASIC METABOLIC PANEL
BUN: 7 mg/dL (ref 6–23)
GFR calc Af Amer: >90 mL/min (ref >90)
GFR calc non Af Amer: >90 mL/min (ref >90)
Potassium: 3.7 mEq/L (ref 3.5–5.1)

## 2011-09-15 MED ORDER — PROPRANOLOL HCL 10 MG PO TABS
10.0000 mg | ORAL_TABLET | Freq: Three times a day (TID) | ORAL | Status: DC
  Administered 2011-09-15 – 2011-09-24 (×22): 10 mg via ORAL
  Filled 2011-09-15 (×28): qty 1

## 2011-09-15 MED ORDER — QUETIAPINE FUMARATE ER 200 MG PO TB24
200.0000 mg | ORAL_TABLET | ORAL | Status: DC
  Administered 2011-09-15: 200 mg via ORAL
  Filled 2011-09-15 (×2): qty 1

## 2011-09-15 MED ORDER — HALOPERIDOL LACTATE 5 MG/ML IJ SOLN: 5.0000 mg | Freq: Three times a day (TID) | INTRAMUSCULAR | Status: DC | PRN

## 2011-09-15 MED ORDER — BENZTROPINE MESYLATE 1 MG/ML IJ SOLN
1.0000 mg | Freq: Two times a day (BID) | INTRAMUSCULAR | Status: DC
  Administered 2011-09-15: 1 mg via INTRAVENOUS
  Filled 2011-09-15 (×5): qty 1

## 2011-09-15 MED ORDER — BENZTROPINE MESYLATE 1 MG/ML IJ SOLN
1.0000 mg | Freq: Two times a day (BID) | INTRAMUSCULAR | Status: DC
  Filled 2011-09-15: qty 1

## 2011-09-15 MED ORDER — HALOPERIDOL LACTATE 5 MG/ML IJ SOLN
5.0000 mg | Freq: Three times a day (TID) | INTRAMUSCULAR | Status: DC | PRN
  Filled 2011-09-15: qty 1

## 2011-09-15 NOTE — Progress Notes (Signed)
Subjective: Pt more alert. Patient answering some questions. Per the sitter patient ate breakfast this morning. Patient is currently eating. Patient denies any chest pain, no shortness of breath. Patient states able to swallow.  Objective: Vital signs in last 24 hours: Filed Vitals:   09/14/11 2158 09/15/11 0645 09/15/11 0804 09/15/11 1400  BP: 125/88 118/79  118/75  Pulse: 117 92  54  Temp: 99.2 F (37.3 C) 99.1 F (37.3 C)  97.9 F (36.6 C)  TempSrc: Oral Oral  Oral  Resp: 16 18  20   Height:      Weight:   85.2 kg (187 lb 13.3 oz)   SpO2: 97% 97%  98%    Intake/Output Summary (Last 24 hours) at 09/15/11 1655 Last data filed at 09/15/11 1430  Gross per 24 hour  Intake   1560 ml  Output   1050 ml  Net    510 ml    Weight change:   General: NAD HEENT: Normocephalic atraumatic. Pupils equal round and reactive to light and accommodation. Extraocular movements intact. Or fractures clear, no lesions, no exudates. Neck is supple no lymphadenopathy. Heart: Tachycardic, RR no murmurs rubs or gallops Lungs: Clear to auscultation bilaterally Abdomen: Soft, nontender, nondistended, positive bowel sounds Extremities no clubbing cyanosis or edema Neuro: Grossly intact. No focal deficits. Psychiatry: Flat affect. Patient denies any suicidal ideation or homicidal ideation. Eye contact is poor. Judgment and insight are limited.    Lab Results:  Basename 09/15/11 0430 09/14/11 0443  NA 136 137  K 3.7 3.5  CL 104 105  CO2 19 22  GLUCOSE 77 79  BUN 7 7  CREATININE 0.51 0.56  CALCIUM 8.9 8.7  MG -- --  PHOS -- --   No results found for this basename: AST:2,ALT:2,ALKPHOS:2,BILITOT:2,PROT:2,ALBUMIN:2 in the last 72 hours No results found for this basename: LIPASE:2,AMYLASE:2 in the last 72 hours No results found for this basename: WBC:2,NEUTROABS:2,HGB:2,HCT:2,MCV:2,PLT:2 in the last 72 hours No results found for this basename: CKTOTAL:3,CKMB:3,CKMBINDEX:3,TROPONINI:3 in the last  72 hours No components found with this basename: POCBNP:3 No results found for this basename: DDIMER:2 in the last 72 hours No results found for this basename: HGBA1C:2 in the last 72 hours No results found for this basename: CHOL:2,HDL:2,LDLCALC:2,TRIG:2,CHOLHDL:2,LDLDIRECT:2 in the last 72 hours No results found for this basename: TSH,T4TOTAL,FREET3,T3FREE,THYROIDAB in the last 72 hours  Basename 09/13/11 0926  VITAMINB12 1338*  FOLATE --  FERRITIN --  TIBC --  IRON --  RETICCTPCT --    Micro Results: Recent Results (from the past 240 hour(s))  MRSA PCR SCREENING     Status: Normal   Collection Time   09/11/11  8:21 PM      Component Value Range Status Comment   MRSA by PCR NEGATIVE  NEGATIVE Final     Studies/Results: No results found.  Medications:     . benztropine mesylate  1 mg Intravenous BID  . enoxaparin (LOVENOX) injection  40 mg Subcutaneous Q24H  . LORazepam  2 mg Intramuscular Q6H  . potassium chloride  40 mEq Oral Once  . propranolol  10 mg Oral BID  . QUEtiapine  200 mg Oral Q24H  . sodium chloride  3 mL Intravenous Q12H  . DISCONTD: benztropine mesylate  1 mg Intravenous BID  . DISCONTD: benztropine mesylate  1 mg Intramuscular BID    Assessment: Principal Problem:  *Schizophrenia, catatonic type Active Problems:  MIGRAINE, COMMON, INTRACTABLE  Encephalopathy acute  Tachycardia  Seasonal allergies  Hypokalemia  Altered mental status  Schizophrenia, catatonic   Plan: #1 schizophrenia catatonic type/ Depression Patient likely had schizophrenia catatonic type and per psychiatry had presented with prodromal symptoms to psychosis and most likely schizophrenia catatonic type. Continue Cogentin 1 mg IV twice a day. Continue Haldol 5 mg IV every 8 hours when necessary for agitation and combativeness. Will start patient on Seroquel XR 200 mg Daily at 8pm per psychiatry recommendations. Patient will likely need inpatient psychiatry. Psychiatry is  following I appreciate input and recommendations. PT /OT #2 acute encephalopathy Patient is alert and moving all extremities minimally. Likely in  a catatonic state and psychiatrically mediated secondary to schizophrenia catatonic type.. Patient is less agitated and combative today. Patient denies any suicidal or homicidal ideation today. 2 days ago patient had stated that she wanted to  kill herself and was trying to strangle herself.. Patient had to be held down by staff and security 2 days ago. Patient now in restraints. MRI of the head was negative. CT of the head was negative. Patient has been seen by neurology and feel all secondary to a psychiatric issues. Per neurology no further workup needed. RPR is reactive however confirmatory test and is pending. Doubt if patient has tertiary syphilis causing her symptoms. HIV antibody is negative. Urine GC Chlamydia is negative. Psych is following. Patient has been seen by Dr. Ninetta Lights of infectious disease who states that patient was previously treated with doxycycline 10/23/2004. Also states had a positive RPR is low-level and an indication of a prior infection. The patient has already been adequately treated per ID and no further indication for further treatment at this time. Haldol 5 mg IV prn. Continue Cogentin 1 mg IV twice a day per psych recommendations. Will start patient on Seroquel XRT 200 mg daily per psych recommendations. Psychiatry is following and further recommendations per psychiatry.  #3 tachycardia EKG with a sinus tachycardia. Patient with no signs or symptoms of infection. Patient is not anemic. D-dimer is negative. TSH is within normal limits. Free T4 is within normal limits. Chest x-ray is negative. Heart rate has been ranging from the 80s to 150s. Magnesium was within normal limits. Potassium has been repleted. 2-D echo with EF of 60-65%. No wall motion abnormalities.  . Increase propranolol to 10 mg by mouth TID. Respiradal discontinued   however patient still with some tachycardia. No further workup is needed at this time.  #4 hypokalemia Repleted.  #5 prophylaxis Lovenox for DVT prophylaxis.    LOS: 4 days   THOMPSON,DANIEL [phone] 09/15/2011, 4:55 PM

## 2011-09-15 NOTE — Progress Notes (Signed)
Patient Discharge Instructions: patient was transferred to [facility].  Wandra Scot, 09/15/2011, 3:46 PM

## 2011-09-15 NOTE — Progress Notes (Signed)
IV site to right FA is outdated, right arm noted to be swollen and tender.  Attempted x3 to restart IV in left arm with no success. IV team paged.

## 2011-09-15 NOTE — Progress Notes (Signed)
MD returned page and changed IV meds to IM temporarily. Wants a IV access and asked to be called back to change meds back to IV when a site is obtained.  Spoke with IV team and they stated per policy since patient refused and 2 IV team nurses attempted they cannot come back to start IV. Will attempt again at later time

## 2011-09-15 NOTE — Evaluation (Signed)
Occupational Therapy Evaluation Patient Details Name: Debra Guerrero MRN: [id number] DOB: 02-Aug-1985 Today's Date: 09/15/2011 Time: 4098-1191 OT Time Calculation (min): 11 min  OT Assessment / Plan / Recommendation Clinical Impression  This 26 year old female was admitted with acute encephalopathy.  Pt has a h/o schizophrenia and was independent prior to admission with adls and all mobility.  Pt is appropriate for skilled OT.    OT Assessment  Patient needs continued OT Services    Follow Up Recommendations  Other (comment) (depends upon progress.  Eval was limited--?SNF vs 24/7)    Barriers to Discharge      Equipment Recommendations  Other (comment) (to be further assessed)    Recommendations for Other Services    Frequency  Min 2X/week    Precautions / Restrictions Precautions Precautions: Fall Restrictions Weight Bearing Restrictions: No   Pertinent Vitals/Pain No pain but felt uncomfortable sitting eob    ADL  Eating/Feeding: Simulated;Independent Where Assessed - Eating/Feeding: Bed level Grooming: Simulated;Set up Where Assessed - Grooming: Unsupported sitting Upper Body Bathing: Simulated;Supervision/safety Where Assessed - Upper Body Bathing: Unsupported sitting Lower Body Bathing: Simulated;Moderate assistance Where Assessed - Lower Body Bathing: Lean right and/or left Upper Body Dressing: Simulated;Minimal assistance (lines) Where Assessed - Upper Body Dressing: Unsupported sitting Lower Body Dressing: Simulated;Maximal assistance Where Assessed - Lower Body Dressing: Unsupported sitting;Lean right and/or left Toileting - Clothing Manipulation and Hygiene: Simulated;Maximal assistance Where Assessed - Toileting Clothing Manipulation and Hygiene: Lean right and/or left Transfers/Ambulation Related to ADLs: pt not agreeable to standing:  stood with nursing earlier per sitter.  Pt had tremor when bending each leg up to roll.   ADL Comments: Pt feels she can't do  anything and needs encouragement.  Movements were slow especially coming back up from reaching to feet.  Need to further assess vision and balance    OT Diagnosis: Generalized weakness  OT Problem List: Decreased strength;Decreased activity tolerance;Decreased knowledge of use of DME or AE (balance and vision not assessed) OT Treatment Interventions: Self-care/ADL training;Patient/family education;Energy conservation;DME and/or AE instruction;Therapeutic activities   OT Goals Acute Rehab OT Goals OT Goal Formulation: With patient Time For Goal Achievement: 09/29/11 Potential to Achieve Goals: Good ADL Goals Pt Will Perform Grooming: with supervision;Standing at sink ADL Goal: Grooming - Progress: Goal set today Pt Will Transfer to Toilet: with min assist;Stand pivot transfer;3-in-1 ADL Goal: Toilet Transfer - Progress: Goal set today Pt Will Perform Toileting - Hygiene: with supervision;Sit to stand from 3-in-1/toilet ADL Goal: Toileting - Hygiene - Progress: Goal set today Miscellaneous OT Goals Miscellaneous OT Goal #1: Pt will complete LB ADLs with min A sit to stand OT Goal: Miscellaneous Goal #1 - Progress: Goal set today  Visit Information  Last OT Received On: 09/15/11 Assistance Needed: +1 (for eob)    Subjective Data  Subjective: "I can't walk" Patient Stated Goal: get stronger   Prior Functioning  Home Living Additional Comments: Did not ask many questions:  will ask as tx progresses and more rapport is built Prior Function Level of Independence: Independent Communication Communication: No difficulties    Cognition  Overall Cognitive Status: Appears within functional limits for tasks assessed/performed Arousal/Alertness: Awake/alert Behavior During Session: [facility] for tasks performed    Extremity/Trunk Assessment Right Upper Extremity Assessment RUE ROM/Strength/Tone: Within functional levels Left Upper Extremity Assessment LUE ROM/Strength/Tone: Within functional  levels Trunk Assessment Trunk Assessment: Normal   Mobility Bed Mobility Bed Mobility: Left Sidelying to Sit;Rolling Left Rolling Left: 4: Min assist Left Sidelying  to Sit: 3: Mod assist   Exercise    Balance Balance Balance Assessed: Yes Static Sitting Balance Static Sitting - Balance Support: No upper extremity supported Static Sitting - Level of Assistance: 5: Stand by assistance  End of Session OT - End of Session Activity Tolerance: Patient limited by fatigue;Other (comment) (pt uncomfortable sitting eob) Patient left: in bed;with call bell/phone within reach (sitter present)  GO     Milany Geck 09/15/2011, 3:16 PM Marica Otter, OTR/L [phone] 09/15/2011

## 2011-09-15 NOTE — Progress Notes (Signed)
MD requesting PT evaluation.  CSW to continue to follow.  Providence Crosby, LCSWA Clinical Social Work [phone]

## 2011-09-15 NOTE — Progress Notes (Signed)
PROGRESS NOTE follow up to consult note:   Pt is seen with sitter leaving the room.  She has good eye contact and follows MD as pt in bed is approached.  She makes attempts to speak with lips parted and jaw set nearly closed.  Her voice is little louder than a whisper.  She has the appearance of extreme dystonia with significant immobility.  As the tachycardia developed following taking of Risperdal, there is some question of dystonia vs catatonia. Discussed with Dr. Janee Morn and suggested Cogentin 1 mg 2 X daily.  This meeting took place ~ 11:45 am.   Later ~7 pm 09/14/11,  Her mother Elita Quick is interviewed  [phone].  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  She attends classes and has a job preparing meals for pre-school group.  She gradually stopped going to classes, going to work without calling, going to church, stopped taking showers or changing clothes.  She would stay in bed or get up and just sit.  Her mother would ask what was wrong and she said she did not know.  Or she would ask her mother why she was looking at her.  Her mother says she never drank alcohol or used drugs.  She never knew her to have suicidal thoughts nor did she act as though she was responding to auditory hallucinations.   Her mother was adopted [no fam. Hx] and her father had a diagnosis of Bipolar Disorder and took Lithium.  Based upon this information that is consistent with prodromal symptoms to psychosis, most likely schizophrenia, catatonic state, it is likely she will benefit from SGA Rx.  Mother also spoke with Dr. Janee Morn .  RECOMMENDATION:  1.  Suggest Cogentin 1 mg 2 X daily.  2  Will discuss SGA with Dr. Janee Morn, favor Seroquel XR 200 mg  After dinner ~ 8 pm or Geodon40 mg 2X dialy with meals 3.  Will discuss with Psych CSW for transfer to [facility] when medically stable.  4.  Will follow Hannie Shoe J. Ferol Luz, MD Psychiatrist  09/15/2011 2:39 AM

## 2011-09-15 NOTE — Progress Notes (Signed)
Per IV team patient refused to be stuck. MD paged

## 2011-09-16 LAB — BASIC METABOLIC PANEL
Calcium: 8.7 mg/dL (ref 8.4–10.5)
Chloride: 107 mEq/L (ref 96–112)
Creatinine, Ser: 0.51 mg/dL (ref 0.50–1.10)
GFR calc Af Amer: >90 mL/min (ref >90)

## 2011-09-16 MED ORDER — QUETIAPINE FUMARATE ER 300 MG PO TB24
300.0000 mg | ORAL_TABLET | ORAL | Status: DC
  Administered 2011-09-16 – 2011-09-23 (×8): 300 mg via ORAL
  Filled 2011-09-16 (×9): qty 1

## 2011-09-16 MED ORDER — POTASSIUM CHLORIDE CRYS ER 20 MEQ PO TBCR
40.0000 meq | EXTENDED_RELEASE_TABLET | Freq: Two times a day (BID) | ORAL | Status: AC
  Administered 2011-09-16: 40 meq via ORAL
  Filled 2011-09-16 (×2): qty 2

## 2011-09-16 NOTE — Progress Notes (Signed)
Patient's heart rate will randomly increase to 160s-170s for just a second and then decreases right back down to 90's-low 100's.  Patient is resting in the bed and is asymptomatic.  Dr. Izola Price notified.

## 2011-09-16 NOTE — Progress Notes (Signed)
Patient refused all morning medications.  Dr. Lenise Arena notified.

## 2011-09-16 NOTE — Progress Notes (Signed)
MD-Patient still has a foley.  Please address as to whether or not we can appropriately DC the catheter.  Thanks!

## 2011-09-16 NOTE — Progress Notes (Signed)
Patient took potassium pills, began to choke, and spit up 1/2 of the dose.

## 2011-09-16 NOTE — Progress Notes (Signed)
Lab tried to stick the patient twice with no blood return.  Patient refuses to be stuck a third time.  Dr. Izola Price notified.

## 2011-09-16 NOTE — Progress Notes (Addendum)
Follow up Consult note for pt with new onset Schizophrenic, Catatonic type  Discussed with Psych CSW and RN Pt is awake with wide-opened eyes.  She responds to greeting, questions with a low volume speech. She speaks with rapid rate.  Most comments cannot be understood.  She has a hostile tone in responses.   Before leaving she begins to pull adhesive, anchoring her catheter.  The staff is able to calm her.  She c/o scratching.   RN states she has refused medication and refuses to shower.  She is encouraged to shower and get lotion to soothe her skin.  She declines.   Her mother arrives to visit.   Considering her hostile undertones and evidence of auditory and visual hallucinations (she claims her friend Corrie Dandy is in the hall, NOT, and she says Corrie Dandy, answer the phone, it's ringing').she may benefit from an increase of SGA to quiet psychotic AH.  Called Dr. Lenise Arena. RECOMMENDATION:  1. Suggest increase Seroquel XR 300  Mg at 8 pm 2. Recruit mother to encourage pt to shower.  Francis Dowse may not be cognitive intact enough to agree at this time] 3. Will follow Brookelle Pellicane J. Ferol Luz, MD Psychiatrist  09/16/2011 ..10:25 PM  .

## 2011-09-16 NOTE — Progress Notes (Signed)
Clinical Social Work Department CLINICAL SOCIAL WORK PSYCHIATRY SERVICE LINE ASSESSMENT 09/16/2011  Patient:  Debra Guerrero  Account:  [id number]  Admit Date:  09/11/2011  Clinical Social Worker:  Doroteo Glassman  Date/Time:  09/16/2011 11:12 AM Referred by:  Physician  Date referred:  09/16/2011 Reason for Referral  Behavioral Health Issues   Presenting Symptoms/Problems (In the person's/family's own words):   Pt presented to ED from [facility] due to medical concerns.    Abuse/Neglect/Trauma Comments:   Psychiatric History (check all that apply)  Inpatient/hospitilization   Psychiatric medications:  Seroquel.   Current Mental Health Hospitalizations/Previous Mental Health History:   [facility]   Current provider:   [facility]   Place and Date:   Current Medications:   See H&P   Previous Impatient Admission/Date/Reason:   Admitted to [facility] on 09/10/11   Emotional Health / Current Symptoms    Suicide/Self Harm  Suicidal ideation (ex: "I can't take any more,I wish I could disappear")   Suicide attempt in the past:   Unknown   Other harmful behavior:   Pt presented to ED by hx due to SI   Psychotic/Dissociative Symptoms  Catatonic Behavior   Other Psychotic/Dissociative Symptoms:    Attention/Behavioral Symptoms  Withdrawn   Other Attention / Behavioral Symptoms:    Cognitive Impairment  Other - See comment   Other Cognitive Impairment:   Pt reports feeling confused and is having trouble making decisions.  Pt refusing to take meds and bathe.    Mood and Adjustment  Flat  Confused  Guarded    Stress, Anxiety, Trauma, Any Recent Loss/Stressor  None reported   Anxiety (frequency):   Phobia (specify):   Compulsive behavior (specify):   Obsessive behavior (specify):   Other:   Substance Abuse/Use  None   SBIRT completed (please refer for detailed history):  N  Self-reported substance use:   Urinary Drug Screen Completed:  Y Alcohol level:   less than  11    Environmental/Housing/Living Arrangement  Stable housing   Who is in the home:   Daughter   Emergency contact:  Mother   Financial  Medicaid   Patient's Strengths and Goals (patient's own words):   Clinical Social Worker's Interpretive Summary:   CSW met with Pt to discuss current admission and d/c plan.    Pt speaks softly and is hard to understand.    Pt unable to state why she was admitted.  She admits to suffering from depression but doesn't discuss AVH or SI.    Pt is tearful about d/c plan and reports feeling confused.    Pt agreeing to inpt tx due to feelings of depression.  She is trepidatious, however, due to feeling out-of-place at [facility].    Pt begins crying and states that she wants to return home soon to be with her daugheter.    CSW discussed d/c options with Pt.  Pt asked CSW for some time to think about her options.    CSW thanked Pt for her time.   Disposition:  Recommend Psych CSW continuing to support while in hospital.  CSW to continue to follow.  Providence Crosby, LCSWA Clinical Social Work [phone]

## 2011-09-16 NOTE — Progress Notes (Signed)
Patient ID: Debra Guerrero, female   DOB: 10/28/1985, 26 y.o.   MRN: [id number]  [facility] PROGRESS NOTE  Debra Guerrero NWG:[id number] DOB: 1985/09/05 DOA: 09/11/2011 PCP: No primary provider on file.  Assessment and plan:  Principal Problem:  *Schizophrenia, catatonic type - no significant improvement in clinical status - psych recommends increasing the dose of Seroquel - PT evaluation  Active Problems:  Encephalopathy acute - secondary to principal problem - no significant change - no electrolyte abnormalities   Tachycardia - HR within normal limits this AM   Hypokalemia - will continue to supplement as indicated  Consultants:  Psych  Procedures:  None  Antibiotics:  None  HPI/Subjective: No events overnight. Pt refuses medications.   Objective: Filed Vitals:   09/15/11 2207 09/16/11 0500 09/16/11 1400 09/16/11 1623  BP: 123/76 128/79 119/78   Pulse: 98 8 96 120  Temp: 98.4 F (36.9 C) 98.6 F (37 C) 98 F (36.7 C)   TempSrc: Oral Oral Axillary   Resp: 18 16 18    Height:      Weight:  84.2 kg (185 lb 10 oz)    SpO2: 100% 100% 100%     Intake/Output Summary (Last 24 hours) at 09/16/11 1723 Last data filed at 09/16/11 1400  Gross per 24 hour  Intake   1995 ml  Output   2600 ml  Net   -605 ml    Exam:   General:  Pt is alert, teary eyed, oriented to name only. Flat affect.   Cardiovascular: Regular rate and rhythm, S1/S2, no murmurs, no rubs, no gallops  Respiratory: Clear to auscultation bilaterally, no wheezing, no crackles, no rhonchi  Abdomen: Soft, non tender, non distended, bowel sounds present, no guarding  Extremities: No edema, pulses DP and PT palpable bilaterally  Neuro: Grossly nonfocal  Data Reviewed: Basic Metabolic Panel:  Lab 09/16/11 5784 09/15/11 0430 09/14/11 0443 09/13/11 0545 09/12/11 0358 09/11/11 1948 09/11/11 1600  NA 141 136 137 137 140 -- --  K 3.2* 3.7 3.5 3.9 3.9 -- --  CL 107 104 105 103 111 --  --  CO2 24 19 22 24 22  -- --  GLUCOSE 95 77 79 92 85 -- --  BUN 4* 7 7 9  5* -- --  CREATININE 0.51 0.51 0.56 0.65 0.54 -- --  CALCIUM 8.7 8.9 8.7 9.1 8.8 -- --  MG -- -- -- -- -- 2.0 2.1  PHOS -- -- -- -- -- -- --   CBC:  Lab 09/12/11 0358 09/11/11 1944 09/11/11 0530  WBC 8.7 8.5 7.3  NEUTROABS -- -- --  HGB 12.9 13.2 14.5  HCT 39.5 39.9 43.1  MCV 85.1 84.0 83.4  PLT 250 274 252   Cardiac Enzymes:  Lab 09/12/11 1109 09/12/11 0358 09/11/11 1951  CKTOTAL 53 54 56  CKMB 0.7 0.6 0.8  CKMBINDEX -- -- --  TROPONINI <0.30 <0.30 <0.30     Recent Results (from the past 240 hour(s))  MRSA PCR SCREENING     Status: Normal   Collection Time   09/11/11  8:21 PM      Component Value Range Status Comment   MRSA by PCR NEGATIVE  NEGATIVE Final      Studies: No results found.  Scheduled Meds:   . benztropine mesylate  1 mg Intravenous BID  . enoxaparin (LOVENOX) injection  40 mg Subcutaneous Q24H  . LORazepam  2 mg Intramuscular Q6H  . potassium chloride  40 mEq Oral Once  . potassium  chloride  40 mEq Oral BID  . propranolol  10 mg Oral TID  . QUEtiapine  200 mg Oral Q24H  . sodium chloride  3 mL Intravenous Q12H   Continuous Infusions:   . sodium chloride 75 mL/hr at 09/16/11 0948     Code Status: Full Family Communication: Pt at bedside Disposition Plan: To [facility] when medically stable  [name], MD  [facility] Pager [phone]  If 7PM-7AM, please contact night-coverage [email or web address] Password [email or web address] 09/16/2011, 5:23 PM   LOS: 5 days

## 2011-09-16 NOTE — Progress Notes (Signed)
Met with Pt to discuss d/c options.  Pt was confused and had difficulty recalling recent events, as evidenced by stating that she had never seen the shower in her room but the NT took her into the bathroom earlier that morning and offered to assist her in bathing.  NT had to remind Pt that she refused medication earlier today.  Pt then remembered and stated that she didn't think that she needed it because her heart felt fine.  Pt continues confusing her current stay at [facility] with her recent stay at [facility].    Pt states that she doesn't want to shower because of the "spray they use to clean you" and because "they only give you a toothbrush to wash yourself with."  CSW and NT had to remind Pt several times that she's at [facility] and that there are towels and washcloths available to her for use.  Additionally, CSW and NT reminded Pt that she is able to bathe herself and can do so at her convenience, to which Pt responded, "I just don't have anything to do.  If I had something to do, it'd be better."  Pt was not able to clarify this statement.  CSW thanked Pt for her time.  Psych MD to meet with Pt today.  CSW to continue to follow.  Providence Crosby, LCSWA Clinical Social Work [phone]

## 2011-09-16 NOTE — Evaluation (Signed)
Physical Therapy Evaluation Patient Details Name: Debra Guerrero MRN: [id number] DOB: 02/16/1986 Today's Date: 09/16/2011 Time: 2952-8413 PT Time Calculation (min): 17 min  PT Assessment / Plan / Recommendation Clinical Impression  26 y.o. female admitted with schizophrenia, catatonic type, acute encephalopathy, depression. Pt has BLE weakness and significantly decreased activity tolerance. +2 assist for sit to stand and to walk 3' with a RW. She would benefit from acute PT to maximize safety and facillitate return to independence with mobility.  SNF level of care may be needed upon DC, depending on progress.     PT Assessment  Patient needs continued PT services    Follow Up Recommendations  Skilled nursing facility;Supervision/Assistance - 24 hour    Barriers to Discharge Decreased caregiver support mother works during the day    Engineer, agricultural with 5" wheels    Recommendations for Other Services OT consult   Frequency Min 3X/week    Precautions / Restrictions Precautions Precautions: Fall;Other (comment) Precaution Comments: fall risk due to BLE weakness; suicide precautions (pt has 24* sitter) Restrictions Weight Bearing Restrictions: No   Pertinent Vitals/Pain *8/10 R hip, RN notified**      Mobility  Bed Mobility Bed Mobility: Supine to Sit;Sitting - Scoot to Edge of Bed Rolling Left: 4: Min assist Supine to Sit: 4: Min assist;HOB elevated (HOB 35*) Sitting - Scoot to Edge of Bed: 4: Min assist Transfers Transfers: Sit to Stand;Stand to Sit Sit to Stand: From bed;With upper extremity assist;1: +2 Total assist Sit to Stand: Patient Percentage: 60% Stand to Sit: To chair/3-in-1;With armrests;With upper extremity assist;4: Min assist Details for Transfer Assistance: nursing reported pt's knees buckled in standing earlier this morning. +2 for safety with sit to stand with RW Ambulation/Gait Ambulation/Gait Assistance: 1: +2 Total  assist Ambulation/Gait: Patient Percentage: 70% Ambulation Distance (Feet): 3 Feet Assistive device: Rolling walker Ambulation/Gait Assistance Details: shuffling gait, pt required VCs to keep eyes open, increased time for all movement, fatigue limited distance Gait Pattern: Shuffle;Decreased step length - left;Decreased step length - right    Exercises     PT Diagnosis: Difficulty walking;Generalized weakness;Acute pain;Altered mental status  PT Problem List: Decreased strength;Decreased knowledge of use of DME;Decreased activity tolerance;Pain PT Treatment Interventions: DME instruction;Gait training;Functional mobility training;Therapeutic activities;Patient/family education   PT Goals Acute Rehab PT Goals PT Goal Formulation: With patient Time For Goal Achievement: 09/30/11 Potential to Achieve Goals: Good Pt will go Supine/Side to Sit: with supervision PT Goal: Supine/Side to Sit - Progress: Goal set today Pt will go Sit to Stand: with min assist;with upper extremity assist PT Goal: Sit to Stand - Progress: Goal set today Pt will Ambulate: 16 - 50 feet;with min assist;with rolling walker PT Goal: Ambulate - Progress: Goal set today  Visit Information  Last PT Received On: 09/16/11 Assistance Needed: +2    Subjective Data  Subjective: My hips hurt.  Patient Stated Goal: none stated   Prior Functioning  Home Living Lives With: Other (Comment) (mother) Home Access: Level entry Home Layout: One level Home Adaptive Equipment: None Additional Comments: mother works days Prior Function Level of Independence: Independent Communication Communication: No difficulties    Cognition  Overall Cognitive Status: Appears within functional limits for tasks assessed/performed Arousal/Alertness: Awake/alert Orientation Level: Appears intact for tasks assessed Behavior During Session: [facility] for tasks performed    Extremity/Trunk Assessment Right Upper Extremity Assessment RUE  ROM/Strength/Tone: [facility] for tasks assessed Left Upper Extremity Assessment LUE ROM/Strength/Tone: [facility] for tasks assessed Right Lower Extremity  Assessment RLE ROM/Strength/Tone: Deficits RLE ROM/Strength/Tone Deficits: -3/5 knee extension RLE Sensation: WFL - Light Touch RLE Coordination: WFL - gross/fine motor Left Lower Extremity Assessment LLE ROM/Strength/Tone: Deficits LLE ROM/Strength/Tone Deficits: -3/5 knee extension LLE Sensation: WFL - Light Touch LLE Coordination: WFL - gross/fine motor Trunk Assessment Trunk Assessment: Normal   Balance Balance Balance Assessed: Yes Static Sitting Balance Static Sitting - Balance Support: No upper extremity supported;Feet supported Static Sitting - Level of Assistance: 5: Stand by assistance Static Sitting - Comment/# of Minutes: 3  End of Session PT - End of Session Equipment Utilized During Treatment: Gait belt Patient left: in chair;with call bell/phone within reach;with nursing in room (pt has 24Environmental consultant) Nurse Communication: Mobility status  GP     Tamala Ser 09/16/2011, 11:19 AM [phone]

## 2011-09-17 MED ORDER — HALOPERIDOL 5 MG PO TABS
5.0000 mg | ORAL_TABLET | Freq: Three times a day (TID) | ORAL | Status: DC | PRN
  Administered 2011-09-17: 5 mg via ORAL
  Filled 2011-09-17: qty 1

## 2011-09-17 MED ORDER — HALOPERIDOL LACTATE 5 MG/ML IJ SOLN
5.0000 mg | Freq: Three times a day (TID) | INTRAMUSCULAR | Status: DC | PRN
  Administered 2011-09-20: 5 mg via INTRAMUSCULAR
  Filled 2011-09-17: qty 1

## 2011-09-17 MED ORDER — BENZTROPINE MESYLATE 1 MG PO TABS
1.0000 mg | ORAL_TABLET | Freq: Two times a day (BID) | ORAL | Status: DC
  Administered 2011-09-17 – 2011-09-22 (×9): 1 mg via ORAL
  Filled 2011-09-17 (×15): qty 1

## 2011-09-17 NOTE — Progress Notes (Signed)
Met with Pt in an attempt to discuss d/c options.  Pt had difficult remaining awake.  Pt stated that she will refuse PT and any CIR evaluation.  She asked, "Where can I go to lay around?  That's what's comfortable for me."  Pt states that she doesn't want to do anything.  Discussed Pt with MD.  MD asking for IVC documents on Pt, as Pt, through lack of volition, is a danger to herself.  Completed IVC paperwork.  Notified Magistrate of impending fax.  Confirmed receipt of fax by [employer]  CSW to continue to follow.  Providence Crosby, LCSWA Clinical Social Work [phone]

## 2011-09-17 NOTE — Progress Notes (Signed)
Family friend Otila Kluver was here visiting, patient gave rings to him to give to her mom

## 2011-09-17 NOTE — Progress Notes (Signed)
Occupational Therapy Treatment Patient Details Name: Debra Guerrero MRN: [id number] DOB: October 05, 1985 Today's Date: 09/17/2011 Time: 4098-1191 OT Time Calculation (min): 15 min  OT Assessment / Plan / Recommendation Comments on Treatment Session Pt is a 26 yo female who presents with catatonic schizophrenia. Feel many of pt's impairments are volitional. Pt does demo some ataxic like movements but per sitter, pt was able to spongebathe, use bathroom and brush teeth with very minimal A. Con't skilled OT in acute but pt will likely not need f/u.    Follow Up Recommendations  No OT follow up    Barriers to Discharge       Equipment Recommendations  Rolling walker with 5" wheels    Recommendations for Other Services    Frequency Min 2X/week   Plan Discharge plan needs to be updated    Precautions / Restrictions Precautions Precautions: Fall;Other (comment) Precaution Comments: fall risk due to BLE weakness; suicide precautions (pt has 24* sitter) Restrictions Weight Bearing Restrictions: No   Pertinent Vitals/Pain     ADL  Toilet Transfer: Simulated;Minimal assistance Toilet Transfer Method: Sit to stand Toilet Transfer Equipment: Other (comment) (recliner) Transfers/Ambulation Related to ADLs: Upon initially standing with OT pt apperared tremulous with B knees buckling. Pt ambulated to the bathroom with a shuffling type, ataxic, scissoring gait. When presented with RW, pt became agitated but did agree to walk `287ft in the hallway with close minguard A. Therapist limited the amount of VCs so as though not to agitate the pt further.    OT Diagnosis:    OT Problem List:   OT Treatment Interventions:     OT Goals ADL Goals ADL Goal: Grooming - Progress: Progressing toward goals Pt Will Transfer to Toilet: with modified independence ADL Goal: Toilet Transfer - Progress: Updated due to goal met Miscellaneous OT Goals OT Goal: Miscellaneous Goal #1 - Progress: Met  Visit  Information  Last OT Received On: 09/17/11 Assistance Needed: +2 (for safety) PT/OT Co-Evaluation/Treatment: Yes    Subjective Data  Subjective: I wasnt walking because I didnt want to.   Prior Functioning       Cognition  Overall Cognitive Status: Appears within functional limits for tasks assessed/performed Arousal/Alertness: Awake/alert Orientation Level: Appears intact for tasks assessed Behavior During Session: Agitated Cognition - Other Comments: Pt appears almost childlike and does not respond well to safety cues or questions.    Mobility Transfers Sit to Stand: 4: Min assist;With armrests;With upper extremity assist;From chair/3-in-1 Stand to Sit: To chair/3-in-1;With armrests;With upper extremity assist;4: Min assist   Exercises    Balance    End of Session OT - End of Session Equipment Utilized During Treatment: Gait belt Activity Tolerance: Treatment limited secondary to agitation Patient left: in chair;with call bell/phone within reach (sitter present.)  GO     Fraidy Mccarrick A OTR/L [phone] 09/17/2011, 3:57 PM

## 2011-09-17 NOTE — Progress Notes (Signed)
Physical Therapy Treatment Patient Details Name: Debra Guerrero MRN: [id number] DOB: 10/29/1985 Today's Date: 09/17/2011 Time: 9528-4132 PT Time Calculation (min): 12 min  PT Assessment / Plan / Recommendation Comments on Treatment Session  Cues and input limited to decrease any pt agitation and maximize performance    Follow Up Recommendations  Skilled nursing facility;Supervision/Assistance - 24 hour    Barriers to Discharge        Equipment Recommendations  Rolling walker with 5" wheels    Recommendations for Other Services OT consult  Frequency Min 3X/week   Plan Discharge plan remains appropriate    Precautions / Restrictions Precautions Precautions: Fall;Other (comment) Precaution Comments: fall risk due to BLE weakness; suicide precautions (pt has 24* sitter) Restrictions Weight Bearing Restrictions: No   Pertinent Vitals/Pain "My hips hurt because I was not walking fast enough" - pain unrated    Mobility  Transfers Transfers: Sit to Stand;Stand to Sit Sit to Stand: 4: Min assist;With armrests;With upper extremity assist;From chair/3-in-1 Stand to Sit: To chair/3-in-1;With armrests;With upper extremity assist;4: Min assist Ambulation/Gait Ambulation/Gait Assistance: 1: +2 Total assist Ambulation/Gait: Patient Percentage: 80% Ambulation Distance (Feet): 200 Feet Assistive device: Rolling walker Ambulation/Gait Assistance Details: Ambulated 73' with HHA - pt grossly unstable with decreased stride length, intermittantly scissoring step on R and exagerrated wt sifts side to side.  With RW, pt increased stability, stride length, speed, but with cues requeired to increased BOS and maintain appropriate position from RW Gait Pattern: Step-through pattern;Decreased step length - right;Decreased step length - left;Scissoring;Ataxic;Narrow base of support    Exercises     PT Diagnosis:    PT Problem List:   PT Treatment Interventions:     PT Goals Acute Rehab PT  Goals PT Goal Formulation: With patient Time For Goal Achievement: 09/30/11 Potential to Achieve Goals: Good Pt will go Supine/Side to Sit: with supervision Pt will go Sit to Stand: with min assist;with upper extremity assist PT Goal: Sit to Stand - Progress: Progressing toward goal Pt will Ambulate: 16 - 50 feet;with min assist;with rolling walker PT Goal: Ambulate - Progress: Progressing toward goal  Visit Information  Last PT Received On: 09/17/11 Assistance Needed: +2 PT/OT Co-Evaluation/Treatment: Yes (For saftey)    Subjective Data  Subjective: My hips hurt. I wanna walk, I don't wanna walk.  I could walk faster without this (RW). Patient Stated Goal: none stated   Cognition  Overall Cognitive Status: Appears within functional limits for tasks assessed/performed Arousal/Alertness: Awake/alert Orientation Level: Appears intact for tasks assessed    Balance     End of Session PT - End of Session Equipment Utilized During Treatment: Gait belt Activity Tolerance: Patient tolerated treatment well Patient left: in chair;with call bell/phone within reach;with nursing in room Nurse Communication: Mobility status   GP     Debra Guerrero 09/17/2011, 3:39 PM

## 2011-09-17 NOTE — Progress Notes (Signed)
Patient ID: Debra Guerrero, female   DOB: Oct 29, 1985, 26 y.o.   MRN: [id number]  [facility] PROGRESS NOTE  Debra Guerrero NWG:[id number] DOB: Nov 30, 1985 DOA: 09/11/2011 PCP: No primary provider on file.  Assessment and plan:  Principal Problem:  *Schizophrenia, catatonic type  - no significant improvement in clinical status  - psych recommends increasing the dose of Seroquel  - PT evaluation recommends SNF, rolling walker  Active Problems:  Encephalopathy acute  - secondary to principal problem  - no significant change  - no electrolyte abnormalities   Tachycardia  - HR within normal limits this AM  Hypokalemia  - will continue to supplement as indicated   Consultants:  Psych Procedures:  None Antibiotics:  None  HPI/Subjective: No events overnight.  Objective: Filed Vitals:   09/16/11 2146 09/17/11 0600 09/17/11 0645 09/17/11 1415  BP: 116/80  121/74 100/70  Pulse: 110  91 56  Temp: 97.8 F (36.6 C)  97.9 F (36.6 C) 98.2 F (36.8 C)  TempSrc: Oral  Oral Oral  Resp: 20  16 16   Height:      Weight:  81.8 kg (180 lb 5.4 oz)    SpO2: 99%  99% 96%    Intake/Output Summary (Last 24 hours) at 09/17/11 1613 Last data filed at 09/16/11 2300  Gross per 24 hour  Intake    240 ml  Output    250 ml  Net    -10 ml    Exam:   General:  Pt is alert, follows commands appropriately, not in acute distress  Cardiovascular: Regular rhythm, tachycardic, S1/S2, no murmurs, no rubs, no gallops  Respiratory: Clear to auscultation bilaterally, no wheezing, no crackles, no rhonchi  Abdomen: Soft, non tender, non distended, bowel sounds present, no guarding  Extremities: No edema, pulses DP and PT palpable bilaterally  Neuro: Grossly nonfocal  Data Reviewed: Basic Metabolic Panel:  Lab 09/16/11 5784 09/15/11 0430 09/14/11 0443 09/13/11 0545 09/12/11 0358 09/11/11 1948 09/11/11 1600  NA 141 136 137 137 140 -- --  K 3.2* 3.7 3.5 3.9 3.9 -- --  CL 107 104 105  103 111 -- --  CO2 24 19 22 24 22  -- --  GLUCOSE 95 77 79 92 85 -- --  BUN 4* 7 7 9  5* -- --  CREATININE 0.51 0.51 0.56 0.65 0.54 -- --  CALCIUM 8.7 8.9 8.7 9.1 8.8 -- --  MG -- -- -- -- -- 2.0 2.1  PHOS -- -- -- -- -- -- --   CBC:  Lab 09/12/11 0358 09/11/11 1944 09/11/11 0530  WBC 8.7 8.5 7.3  NEUTROABS -- -- --  HGB 12.9 13.2 14.5  HCT 39.5 39.9 43.1  MCV 85.1 84.0 83.4  PLT 250 274 252   Cardiac Enzymes:  Lab 09/12/11 1109 09/12/11 0358 09/11/11 1951  CKTOTAL 53 54 56  CKMB 0.7 0.6 0.8  CKMBINDEX -- -- --  TROPONINI <0.30 <0.30 <0.30     Recent Results (from the past 240 hour(s))  MRSA PCR SCREENING     Status: Normal   Collection Time   09/11/11  8:21 PM      Component Value Range Status Comment   MRSA by PCR NEGATIVE  NEGATIVE Final      Studies: No results found.  Scheduled Meds:   . benztropine  1 mg Oral BID  . enoxaparin (LOVENOX) injection  40 mg Subcutaneous Q24H  . LORazepam  2 mg Intramuscular Q6H  . potassium chloride  40 mEq  Oral Once  . potassium chloride  40 mEq Oral BID  . propranolol  10 mg Oral TID  . QUEtiapine  300 mg Oral Q24H  . sodium chloride  3 mL Intravenous Q12H  . DISCONTD: benztropine mesylate  1 mg Intravenous BID  . DISCONTD: QUEtiapine  200 mg Oral Q24H   Continuous Infusions:   . DISCONTD: sodium chloride 75 mL/hr at 09/16/11 0948      Code Status: Full Family Communication: Pt at bedside Disposition Plan: IVC placement  Debra Presto, MD  [facility] Pager [phone]  If 7PM-7AM, please contact night-coverage [email or web address] Password [email or web address] 09/17/2011, 4:13 PM   LOS: 6 days

## 2011-09-17 NOTE — Progress Notes (Signed)
Obtained authorization from [employer].  Faxed Pt's information to [facility].  Awaiting confirmation that Pt on wait list at [facility].  CSW to continue to follow.  Providence Crosby, LCSWA Clinical Social Work [phone]

## 2011-09-18 LAB — CBC
HCT: 36.5 % (ref 36.0–46.0)
Platelets: 216 10*3/uL (ref 150–400)
RDW: 13.2 % (ref 11.5–15.5)
WBC: 11.4 10*3/uL — ABNORMAL HIGH (ref 4.0–10.5)

## 2011-09-18 LAB — BASIC METABOLIC PANEL
Chloride: 105 mEq/L (ref 96–112)
GFR calc Af Amer: >90 mL/min (ref >90)
Potassium: 3.1 mEq/L — ABNORMAL LOW (ref 3.5–5.1)
Sodium: 141 mEq/L (ref 135–145)

## 2011-09-18 MED ORDER — POTASSIUM CHLORIDE CRYS ER 20 MEQ PO TBCR
40.0000 meq | EXTENDED_RELEASE_TABLET | Freq: Once | ORAL | Status: AC
  Administered 2011-09-18: 40 meq via ORAL
  Filled 2011-09-18: qty 2

## 2011-09-18 NOTE — Progress Notes (Signed)
PROGRESS NOTE:  Pt's mother requests meeting, Began discussion ~ 11 a.m.  Pt is in bed with sitter nearby.  Pt is sleeping soundly and does not waken to her name. .  Conference room located and mother asks about [facility] and IVC.  It is explained that her daughter, Debra Guerrero, has refused to cooperate with many procedures that indicate and lead to restoring normal function. To date, she has been agitated at times, confrontational and oppositional about procedures, taking her medication, participating in PT, eating her meals and performing her ADLs.  Her catatonia symptoms have remitted.  Debra Guerrero is talking, she has eye contact, she is walking with a walker in the hallways. These are indications that the SGA, Seroquel, is helping.  She is told that the more Debra Guerrero can participate in her treatment she will demonstrate she is recovering from her extreme catatonia.   Mother says Debra Guerrero's four-yr-old daughter is missing her mother.  Debra Guerrero has never mentioned her.    She is told that Debra Guerrero had been involved in her church for 3 years.  The group [pt referenced Mary] comes to pray for her.  Their participation is no doubt supportive  and helpful as long as they do no campaign to [place] against the medication.  They need to understand medication and supportive attention combined help her recover her cognitive function..  Her mother agrees and hopes they are not warning her to avoid medication.    Mother and father are very concerned and were not aware of how oppositional Debra Guerrero has been and how this prompted the IVC.  She says they will be present and encourage her to do what she needs  To do to get better.   She is stating that she understands the treatment goals.  Patient Identification:  Debra Guerrero Date of Evaluation:  09/18/2011  DIAGNOSIS:  AXIS I  Schizophrenia, catatonic type,    AXIS II  Deferred  AXIS III  See medical notes.   AXIS IV  econ omic problems, educational problems, other psychosocial or  environmental problems, problems related to social environment    AXIS V  51-60 moderate symptoms    RECOMMENDATION: 1.  Continue IVC 2.  Monitor pt behavior and taking of medications closely.  Dannica Bickham J. Ferol Luz, MD Psychiatrist  09/18/2011  9:51 PM

## 2011-09-18 NOTE — Progress Notes (Signed)
Attempted to meet with Pt.  Pt's mother, Debra Guerrero, present.  Pt was unable to talk with CSW much, as she had difficulty staying awake.  She did identify her mom.  Spoke with Pt's mom re: current situation.  Pt's mom stated that she spoke with Pt yesterday via phone and that Pt informed her that she was going to [facility].  Pt's mom voiced concern re: this placement, stating that she's not sure that [facility] can provide the level of care that Pt needs.    Pt's mom asked if [facility] was an option and CSW discussed with her why [facility] isn't an option, at this point.  Pt's mom stated that the family is committed to being with Pt 24-7 in order to encourage her to eat, take her meds and participate.    Pt's mom asking to speak with psych MD, as she has questions re: Pt's medications.  Notified psych MD.  Psych MD to visit with Pt and her mom within the hour.  Notified Pt and Pt's mom.  CSW to continue to follow.  Providence Crosby, LCSWA Clinical Social Work [phone]

## 2011-09-18 NOTE — Progress Notes (Signed)
Patient ID: Debra Guerrero, female   DOB: Mar 08, 1986, 26 y.o.   MRN: [id number]  [facility] PROGRESS NOTE  Debra Guerrero WUJ:[id number] DOB: 03-28-1985 DOA: 09/11/2011 PCP: No primary provider on file.  Assessment and plan:  Principal Problem:  *Schizophrenia, catatonic type  - no significant improvement in clinical status  - psych recommends increasing the dose of Seroquel  - PT evaluation recommends SNF, rolling walker   Active Problems:  Encephalopathy acute  - secondary to principal problem  - no significant change  - no electrolyte abnormalities   Tachycardia  - HR within normal limits this AM  Hypokalemia  - will continue to supplement as indicated   Consultants:  Psych Procedures:  None Antibiotics:  None  HPI/Subjective:  No events overnight.  Objective: Filed Vitals:   09/17/11 2057 09/17/11 2142 09/18/11 0521 09/18/11 1353  BP: 107/79 132/115 125/76 103/76  Pulse: 92 116 105 85  Temp: 97.3 F (36.3 C) 98.3 F (36.8 C) 98.1 F (36.7 C) 98.5 F (36.9 C)  TempSrc: Oral Oral Oral Oral  Resp: 18 20 18 20   Height:      Weight:      SpO2: 100% 94% 99% 100%    Intake/Output Summary (Last 24 hours) at 09/18/11 1728 Last data filed at 09/18/11 1049  Gross per 24 hour  Intake    240 ml  Output      0 ml  Net    240 ml    Exam:   General:  Pt not in acute distress  Cardiovascular: Regular rate and rhythm, S1/S2, no murmurs, no rubs, no gallops  Respiratory: Clear to auscultation bilaterally, no wheezing, no crackles, no rhonchi  Abdomen: Soft, non tender, non distended, bowel sounds present, no guarding  Extremities: No edema, pulses DP and PT palpable bilaterally  Neuro: Grossly nonfocal  Data Reviewed: Basic Metabolic Panel:  Lab 09/18/11 9562 09/16/11 0440 09/15/11 0430 09/14/11 0443 09/13/11 0545 09/11/11 1948  NA 141 141 136 137 137 --  K 3.1* 3.2* 3.7 3.5 3.9 --  CL 105 107 104 105 103 --  CO2 26 24 19 22 24  --  GLUCOSE 86  95 77 79 92 --  BUN 6 4* 7 7 9  --  CREATININE 0.56 0.51 0.51 0.56 0.65 --  CALCIUM 9.0 8.7 8.9 8.7 9.1 --  MG -- -- -- -- -- 2.0  PHOS -- -- -- -- -- --   CBC:  Lab 09/18/11 0430 09/12/11 0358 09/11/11 1944  WBC 11.4* 8.7 8.5  NEUTROABS -- -- --  HGB 12.3 12.9 13.2  HCT 36.5 39.5 39.9  MCV 82.8 85.1 84.0  PLT 216 250 274   Cardiac Enzymes:  Lab 09/12/11 1109 09/12/11 0358 09/11/11 1951  CKTOTAL 53 54 56  CKMB 0.7 0.6 0.8  CKMBINDEX -- -- --  TROPONINI <0.30 <0.30 <0.30     Recent Results (from the past 240 hour(s))  MRSA PCR SCREENING     Status: Normal   Collection Time   09/11/11  8:21 PM      Component Value Range Status Comment   MRSA by PCR NEGATIVE  NEGATIVE Final      Studies: No results found.  Scheduled Meds:   . benztropine  1 mg Oral BID  . enoxaparin (LOVENOX) injection  40 mg Subcutaneous Q24H  . LORazepam  2 mg Intramuscular Q6H  . potassium chloride  40 mEq Oral Once  . propranolol  10 mg Oral TID  . QUEtiapine  300 mg Oral Q24H  . sodium chloride  3 mL Intravenous Q12H   Continuous Infusions:   Code Status: Full Family Communication: Pt at bedside Disposition Plan: IVC, when bed available  Debra Presto, MD  [facility] Pager [phone]  If 7PM-7AM, please contact night-coverage [email or web address] Password [email or web address] 09/18/2011, 5:28 PM   LOS: 7 days

## 2011-09-18 NOTE — Progress Notes (Signed)
PROGRESS NOTE s/p Consult reuest;  Psych CSW  Notifies Psych MD in am that IVC for Debra Guerrero is signed.  continuesthis am to be non-adherent to genral ADLS,  Eating, taking medication and cooperating with PT. Pt is approached ~ 2:45 pm  A church group is having a prayer meeting in hr room ~ 2 hours.  They are praying, singing [employer] and ____.  Her father is present and requests explanation.  He appears concerned and says 3 maternal relatives  have committed suicide.  Pt gives permission to speak with her father.   She has refused breakfast, lunch and medications.  She refuses to shower.  After the church group and her father leave, she is invited to look at the menu and choose lunch.  She is informed that there is an IVC and she will be sent to [facility] for not following any self care and treatment at [facility].  She asks how to survive there?  She is told; just like at [facility]:  Eat, take medications, cooperate, and exercise with PT.  She immediately says 'I can walk.  I'll show you" She walks to BR with the sitter supports her.  She is unsteady but walks with support to BR.  She is conversational without making sarcastic comments or being oppositional The discussion of IVC to [facility] appears to have helped her realized the consequences of her oppositional behavior.  The SGA quetiapine XR appears to have relieved her of her catatonic state. Each d ay demonstrates progressive improvement.  There were no references to auditory hallucinations  That were apparent  Yesterday                                                                                                                      Patient Identification:  Debra Guerrero Date of Evaluation:  09/18/2011  Mental Status Examination/Evaluation: Objective:  Appearance: Many long strands of braided hair  Psychomotor Activity:  Normal  Eye Contact::  Good  Speech:  Clear and Coherent and at times confrontational, sarcastic or oppositional  Volume:  Decreased    Mood:  Dysphoric  Affect:  Blunt  Thought Process:  cognitively intact  Orientation:  Other:  partially oriented to person, place situation  Thought Content:  Paranoia  Suicidal Thoughts:  No  Homicidal Thoughts:  No  Judgement:  Impaired  Insight:  Lacking    DIAGNOSIS:   AXIS I   Schizophrenia catatonic type  AXIS II  Deffered  AXIS III See medical notes.  AXIS IV economic problems, educational problems, housing problems, occupational problems, other psychosocial or environmental problems, problems related to social environment and problems with primary support group  AXIS V 51-60 moderate symptoms   Assessment/Plan: Discussed with Psych CSW Pt has a sitter and needs to remain for the purpose of safety for pt; ability to report her demeanor when requested to take medication; report on her appetite, affect and mood.  RECOMMENDATION:  1. Maintain IVC, seek transfer to  [facility] when medically stable.  2. Continue Seroquel XR 300 mg daily  3. Suggest daily PT to restore balance and stable gait 4. No further psychiatric needs unless requested.  Debra Guerrero J. Ferol Luz, MD Psychiatrist  09/18/2011 1:17 AM

## 2011-09-19 LAB — BASIC METABOLIC PANEL
CO2: 25 mEq/L (ref 19–32)
Chloride: 104 mEq/L (ref 96–112)
Potassium: 3.6 mEq/L (ref 3.5–5.1)
Sodium: 139 mEq/L (ref 135–145)

## 2011-09-19 NOTE — Consult Note (Signed)
Reason for Consult: Catatonia, and abnormal labs Referring Physician: Dr. Benedict Needy Debra Guerrero is an 26 y.o. female.  HPI: Patient was seen as a follow up psychiatric consultation during this weekend. Patient was sitting in her bed calm, quite cooperative. Patient reported she was able to get out of the her bed use the restroom, but feels wobbly. Patient has resolved anxiety tachycardia. Patient has resolved hypokalemia. Patient patient is requesting to go home as soon as she can. Patient denied symptoms of depression, anxiety, psychosis, suicidal and homicidal ideation, and stated mood is feeling better, and she has the appropriate affect, which is congruent with her mood. Patient has no involuntary movements maintained good eye contact. She has a fair insight, judgment and impulse control.  Past Medical History  Diagnosis Date  . Asthma   . Headache   . Depression   . Seasonal allergies 09/11/2011  . Schizophrenia, catatonic type 09/10/2011    History reviewed. No pertinent past surgical history.  History reviewed. No pertinent family history.  Social History:  reports that she has quit smoking. Her smoking use included Cigarettes. She has never used smokeless tobacco. She reports that she does not drink alcohol or use illicit drugs.  Allergies: No Known Allergies  Medications: I have reviewed the patient's current medications.  Results for orders placed during the hospital encounter of 09/11/11 (from the past 48 hour(s))  CBC     Status: Abnormal   Collection Time   09/18/11  4:30 AM      Component Value Range Comment   WBC 11.4 (*) 4.0 - 10.5 K/uL    RBC 4.41  3.87 - 5.11 MIL/uL    Hemoglobin 12.3  12.0 - 15.0 g/dL    HCT 16.1  09.6 - 04.5 %    MCV 82.8  78.0 - 100.0 fL    MCH 27.9  26.0 - 34.0 pg    MCHC 33.7  30.0 - 36.0 g/dL    RDW 40.9  81.1 - 91.4 %    Platelets 216  150 - 400 K/uL   BASIC METABOLIC PANEL     Status: Abnormal   Collection Time   09/18/11  4:30 AM     Component Value Range Comment   Sodium 141  135 - 145 mEq/L    Potassium 3.1 (*) 3.5 - 5.1 mEq/L    Chloride 105  96 - 112 mEq/L    CO2 26  19 - 32 mEq/L    Glucose, Bld 86  70 - 99 mg/dL    BUN 6  6 - 23 mg/dL    Creatinine, Ser 7.82  0.50 - 1.10 mg/dL    Calcium 9.0  8.4 - 95.6 mg/dL    GFR calc non Af Amer >90  >90 mL/min    GFR calc Af Amer >90  >90 mL/min   BASIC METABOLIC PANEL     Status: Normal   Collection Time   09/19/11  6:11 AM      Component Value Range Comment   Sodium 139  135 - 145 mEq/L    Potassium 3.6  3.5 - 5.1 mEq/L    Chloride 104  96 - 112 mEq/L    CO2 25  19 - 32 mEq/L    Glucose, Bld 94  70 - 99 mg/dL    BUN 6  6 - 23 mg/dL    Creatinine, Ser 2.13  0.50 - 1.10 mg/dL    Calcium 9.2  8.4 - 08.6 mg/dL  GFR calc non Af Amer >90  >90 mL/min    GFR calc Af Amer >90  >90 mL/min     No results found.  No psychosis and Positive for anxiety, bad mood and depression Blood pressure 106/72, pulse 80, temperature 98.7 F (37.1 C), temperature source Oral, resp. rate 18, height 5\' 6"  (1.676 m), weight 180 lb 5.4 oz (81.8 kg), last menstrual period 09/09/2011, SpO2 97.00%.   Assessment/Plan: 1. MDD, per PHx; r/o Mood and Psychotic Disorders once MS improves;  2. Catatonia; Hypokalemia, resolved;  3. r/o Conversion Disorder   1. Patient has showed significant improvement able to participate ADLs and has been wobbly when working 2. Continue current treatment plan and medication management 3. Psychiatric consultation follow as needed  Gustava Berland,JANARDHAHA R. 09/19/2011, 5:31 PM

## 2011-09-19 NOTE — Progress Notes (Signed)
Patient ID: Debra Guerrero, female   DOB: 09/08/1985, 26 y.o.   MRN: [id number]  [facility] PROGRESS NOTE  Debra Guerrero NWG:[id number] DOB: [birth year] DOA: 09/11/2011 PCP: No primary provider on file.  Assessment and plan:  Principal Problem:  *Schizophrenia, catatonic type  - no significant improvement in clinical status  - psych recommends increasing the dose of Seroquel  - PT evaluation recommends SNF, rolling walker   Active Problems:  Encephalopathy acute  - secondary to principal problem  - no significant change  - no electrolyte abnormalities   Tachycardia  - HR within normal limits this AM  Hypokalemia  - will continue to supplement as indicated   Consultants:  Psych  Procedures:  None  Antibiotics:  None  HPI/Subjective:  No events overnight.  Objective: Filed Vitals:   09/18/11 1353 09/18/11 2241 09/19/11 0653 09/19/11 1506  BP: 103/76 122/73 115/68 106/72  Pulse: 85 86 87 80  Temp: 98.5 F (36.9 C) 98.7 F (37.1 C) 98.7 F (37.1 C) 98.7 F (37.1 C)  TempSrc: Oral Oral Oral Oral  Resp: 20 18 18 18   Height:      Weight:      SpO2: 100% 100% 100% 97%    Intake/Output Summary (Last 24 hours) at 09/19/11 1622 Last data filed at 09/19/11 1145  Gross per 24 hour  Intake    480 ml  Output      0 ml  Net    480 ml    Exam:   General:  Pt is alert, follows commands appropriately, not in acute distress, flat affect  Cardiovascular: Regular rate and rhythm, S1/S2, no murmurs, no rubs, no gallops  Respiratory: Clear to auscultation bilaterally, no wheezing, no crackles, no rhonchi  Abdomen: Soft, non tender, non distended, bowel sounds present, no guarding  Extremities: No edema, pulses DP and PT palpable bilaterally  Neuro: Grossly nonfocal  Data Reviewed: Basic Metabolic Panel:  Lab 09/19/11 5784 09/18/11 0430 09/16/11 0440 09/15/11 0430 09/14/11 0443  NA 139 141 141 136 137  K 3.6 3.1* 3.2* 3.7 3.5  CL 104 105 107 104 105    CO2 25 26 24 19 22   GLUCOSE 94 86 95 77 79  BUN 6 6 4* 7 7  CREATININE 0.53 0.56 0.51 0.51 0.56  CALCIUM 9.2 9.0 8.7 8.9 8.7  MG -- -- -- -- --  PHOS -- -- -- -- --   CBC:  Lab 09/18/11 0430  WBC 11.4*  NEUTROABS --  HGB 12.3  HCT 36.5  MCV 82.8  PLT 216    Recent Results (from the past 240 hour(s))  MRSA PCR SCREENING     Status: Normal   Collection Time   09/11/11  8:21 PM      Component Value Range Status Comment   MRSA by PCR NEGATIVE  NEGATIVE Final      Studies: No results found.  Scheduled Meds:   . benztropine  1 mg Oral BID  . enoxaparin (LOVENOX) injection  40 mg Subcutaneous Q24H  . LORazepam  2 mg Intramuscular Q6H  . potassium chloride  40 mEq Oral Once  . potassium chloride  40 mEq Oral Once  . propranolol  10 mg Oral TID  . QUEtiapine  300 mg Oral Q24H  . sodium chloride  3 mL Intravenous Q12H   Continuous Infusions:    Code Status: Full Family Communication: Pt and mother at bedside Disposition Plan: IVC and when bed available  Debra Presto, MD  [facility] Pager [phone]  If 7PM-7AM, please contact night-coverage [email or web address] Password [email or web address] 09/19/2011, 4:22 PM   LOS: 8 days

## 2011-09-20 NOTE — Progress Notes (Signed)
At approximately 1215 pt got out of bed and got in the floor and stretched out on her stomach and started licking the floor. The sitter and I tried to get her back in bed and she became aggressive trying to hit the tech. We called security and they came and put her back in the bed. We put her in 4 point restraints and I gave her 5mg  IM haldol. I paged the MD for the order for restraints.She gave me the order. Approximately 1 hr later she became groggy and security left. The pt went to sleep around 3pm and we took the restraints off. I paged the MD to let her know I d/c'd the restraint order. Pt has been sleeping since.

## 2011-09-20 NOTE — Progress Notes (Signed)
Patient ID: Debra Guerrero, female   DOB: [birth year], 26 y.o.   MRN: [id number]  [facility] PROGRESS NOTE  Debra Guerrero:[id number] DOB: 05-03-1985 DOA: 09/11/2011 PCP: No primary provider on file.  Assessment and plan:  Principal Problem:  *Schizophrenia, catatonic type  - no significant improvement in clinical status  - psych recommends increasing the dose of Seroquel  - PT evaluation recommends SNF, rolling walker   Active Problems:  Encephalopathy acute  - secondary to principal problem  - no significant change  - no electrolyte abnormalities    Tachycardia  - HR within normal limits this AM  Hypokalemia  - will continue to supplement as indicated   Consultants:  Psych Procedures:  None Antibiotics:  None  HPI/Subjective:  No events overnight.   Objective: Filed Vitals:   09/19/11 0653 09/19/11 1506 09/19/11 2211 09/20/11 0659  BP: 115/68 106/72 101/63   Pulse: 87 80 91   Temp: 98.7 F (37.1 C) 98.7 F (37.1 C) 98.7 F (37.1 C)   TempSrc: Oral Oral Oral   Resp: 18 18 18 19   Height:      Weight:      SpO2: 100% 97% 96%     Intake/Output Summary (Last 24 hours) at 09/20/11 1426 Last data filed at 09/20/11 0900  Gross per 24 hour  Intake    600 ml  Output      0 ml  Net    600 ml    Exam:   General:  Pt not in acute distress, flat affect  Cardiovascular: Regular rate and rhythm, S1/S2, no murmurs, no rubs, no gallops  Respiratory: Clear to auscultation bilaterally, no wheezing, no crackles, no rhonchi  Abdomen: Soft, non tender, non distended, bowel sounds present, no guarding  Extremities: No edema, pulses DP and PT palpable bilaterally  Data Reviewed: Basic Metabolic Panel:  Lab 09/19/11 6295 09/18/11 0430 09/16/11 0440 09/15/11 0430 09/14/11 0443  NA 139 141 141 136 137  K 3.6 3.1* 3.2* 3.7 3.5  CL 104 105 107 104 105  CO2 25 26 24 19 22   GLUCOSE 94 86 95 77 79  BUN 6 6 4* 7 7  CREATININE 0.53 0.56 0.51 0.51 0.56    CALCIUM 9.2 9.0 8.7 8.9 8.7  MG -- -- -- -- --  PHOS -- -- -- -- --   CBC:  Lab 09/18/11 0430  WBC 11.4*  NEUTROABS --  HGB 12.3  HCT 36.5  MCV 82.8  PLT 216    Recent Results (from the past 240 hour(s))  MRSA PCR SCREENING     Status: Normal   Collection Time   09/11/11  8:21 PM      Component Value Range Status Comment   MRSA by PCR NEGATIVE  NEGATIVE Final      Studies: No results found.  Scheduled Meds:   . benztropine  1 mg Oral BID  . enoxaparin (LOVENOX) injection  40 mg Subcutaneous Q24H  . LORazepam  2 mg Intramuscular Q6H  . potassium chloride  40 mEq Oral Once  . propranolol  10 mg Oral TID  . QUEtiapine  300 mg Oral Q24H  . sodium chloride  3 mL Intravenous Q12H   Continuous Infusions:     Code Status: Full Family Communication: Pt at bedside Disposition Plan: IVC when bed available   [name], MD  [facility] Pager [phone]  If 7PM-7AM, please contact night-coverage [email or web address] Password [email or web address] 09/20/2011, 2:26 PM   LOS: 9 days

## 2011-09-20 NOTE — Progress Notes (Signed)
CSW left sticky for MD to order a Chest x-ray.   Weekday CSW to f/u.  Leron Croak, LCSWA [employer] [phone]

## 2011-09-21 ENCOUNTER — Inpatient Hospital Stay (HOSPITAL_COMMUNITY): Payer: Medicaid Other

## 2011-09-21 LAB — SEDIMENTATION RATE: Sed Rate: 30 mm/hr — ABNORMAL HIGH (ref 0–22)

## 2011-09-21 MED ORDER — DIPHENHYDRAMINE HCL 25 MG PO CAPS
ORAL_CAPSULE | ORAL | Status: AC
  Administered 2011-09-21: 25 mg via ORAL
  Filled 2011-09-21: qty 1

## 2011-09-21 MED ORDER — DIPHENHYDRAMINE HCL 25 MG PO CAPS
25.0000 mg | ORAL_CAPSULE | Freq: Four times a day (QID) | ORAL | Status: DC | PRN
  Administered 2011-09-21 – 2011-09-22 (×3): 25 mg via ORAL
  Filled 2011-09-21 (×2): qty 1

## 2011-09-21 NOTE — Progress Notes (Signed)
Occupational Therapy Note Chart reviewed. Spoke to nursing who states pt just had an ativan and is currently asleep. Will defer treatment until a later time. Judithann Sauger OTR/L [phone] 09/21/2011

## 2011-09-21 NOTE — Progress Notes (Addendum)
Progress F/U with Consult;  Staff report about pt who was prostrate on floor licking the floor until security came and Dr. Izola Price ordered restraints.  She calmed down after Haldol and restraints were removed.   This am pt is awake, alert and speaking with low volume   She says she does not remember licking the floor.  She has not taken morning medication.  She c/o not wanting to fall asleep.  She is encouraged to take her medications for all the various conditions that are being treated [enumerated].  She continues to have eye contact with vacant stare; flat affect.  She says she will try to take medication and RN is asked to offer it again.  NOTED:  She did not cooperate with PT today.  She refuses to take a shower "It's too small"  She says she had to take a sponge bath with RN's encouragement. To date, she has begun to respond with dialogue with staff but continues to refuse treatments that will aid and foster her mental and physical recover.  AXIS I   Schizophrenia, catatonic type, partially resolved AXIS II  Deferred   AXIS III  Hypertension, Hypokalemia AXIS IV  Loss of employment, dropped from college courses, loss of independent care, parenting issues [25 yo daughter [NB She has never mentioned]], resisting treatment and aggressive outbursts AXIS V   GAF 35 recommended  1  Pt continues with bizarre, oppositional and at times combative behavior. 2. Pt is to remain on IVC for [facility] due to poor response to all attempted treatment interventions 3. Sitter is to remain to watch for pt safety and report and bizarre behavior, offer encouragement  4. Consider increase of Seroquel XR to 400 mg two hours before bedtime.  5. Minimize Benzodiazepines daily doses.   6. Will follow pt.  Laraina Sulton J. Ferol Luz, MD Psychiatrist  09/21/2011 8:00 PM

## 2011-09-21 NOTE — Progress Notes (Signed)
Patient ID: Debra Guerrero, female   DOB: 11/20/1985, 26 y.o.   MRN: [id number]  [facility] PROGRESS NOTE  Debra Guerrero WUJ:[id number] DOB: 01-Jun-1985 DOA: 09/11/2011 PCP: No primary provider on file.  Assessment and plan:  Principal Problem:  *Schizophrenia, catatonic type  - no significant improvement in clinical status  - psych recommends increasing the dose of Seroquel  - PT evaluation recommends SNF, rolling walker   Active Problems:  Encephalopathy acute  - secondary to principal problem  - no significant change  - no electrolyte abnormalities   Tachycardia  - HR within normal limits this AM  Hypokalemia  - will continue to supplement as indicated   Consultants:  Psych Procedures:  None Antibiotics:  None   HPI/Subjective: No events overnight.   Objective: Filed Vitals:   09/20/11 1450 09/20/11 2127 09/21/11 0610 09/21/11 1443  BP: 122/71 114/67 112/75 114/76  Pulse: 80 92 94 90  Temp: 98.4 F (36.9 C) 99 F (37.2 C) 98.4 F (36.9 C) 98.4 F (36.9 C)  TempSrc: Axillary Oral Oral Oral  Resp: 20 18 18 18   Height:      Weight:   79.6 kg (175 lb 7.8 oz)   SpO2: 96% 99% 96% 99%    Intake/Output Summary (Last 24 hours) at 09/21/11 1705 Last data filed at 09/21/11 1443  Gross per 24 hour  Intake    600 ml  Output      0 ml  Net    600 ml    Exam:   General:  Pt is alert, follows commands appropriately, not in acute distress  Cardiovascular: Regular rate and rhythm, S1/S2, no murmurs, no rubs, no gallops  Respiratory: Clear to auscultation bilaterally, no wheezing, no crackles, no rhonchi  Abdomen: Soft, non tender, non distended, bowel sounds present, no guarding  Extremities: No edema, pulses DP and PT palpable bilaterally  Neuro: Grossly nonfocal  Data Reviewed: Basic Metabolic Panel:  Lab 09/19/11 9562 09/18/11 0430 09/16/11 0440 09/15/11 0430  NA 139 141 141 136  K 3.6 3.1* 3.2* 3.7  CL 104 105 107 104  CO2 25 26 24 19     GLUCOSE 94 86 95 77  BUN 6 6 4* 7  CREATININE 0.53 0.56 0.51 0.51  CALCIUM 9.2 9.0 8.7 8.9  MG -- -- -- --  PHOS -- -- -- --   CBC:  Lab 09/18/11 0430  WBC 11.4*  NEUTROABS --  HGB 12.3  HCT 36.5  MCV 82.8  PLT 216     Recent Results (from the past 240 hour(s))  MRSA PCR SCREENING     Status: Normal   Collection Time   09/11/11  8:21 PM      Component Value Range Status Comment   MRSA by PCR NEGATIVE  NEGATIVE Final      Studies: Dg Chest Port 1 View  09/21/2011  *RADIOLOGY REPORT*  Clinical Data: Placement  PORTABLE CHEST - 1 VIEW  Comparison: Portable exam 1055 hours compared to 09/11/2011  Findings: Normal heart size, mediastinal contours, and pulmonary vascularity. Lungs clear. No pleural effusion or pneumothorax. No acute osseous findings.  IMPRESSION: No acute abnormalities.  Original Report Authenticated By: Lollie Marrow, M.D.    Scheduled Meds:   . benztropine  1 mg Oral BID  . diphenhydrAMINE      . enoxaparin (LOVENOX) injection  40 mg Subcutaneous Q24H  . LORazepam  2 mg Intramuscular Q6H  . potassium chloride  40 mEq Oral Once  .  propranolol  10 mg Oral TID  . QUEtiapine  300 mg Oral Q24H  . DISCONTD: sodium chloride  3 mL Intravenous Q12H   Continuous Infusions:    Code Status: Full Family Communication: Pt at bedside Disposition Plan: IVC  [name], MD  [facility] Pager [phone]  If 7PM-7AM, please contact night-coverage [email or web address] Password [email or web address] 09/21/2011, 5:05 PM   LOS: 10 days

## 2011-09-21 NOTE — Progress Notes (Signed)
Met with Pt.  Pt alert but confused.  Pt reports that "this has been going on way to long" and "this is getting out of control."  Pt reported that she wants to take her meds but that there are "too many of them" and "they are hard to swallow.....they're hot."  Pt reported that she spoke to her husband today and that she told him that him trying to move away "triggered" something in her.  She stated that she and her husband have been married for 4 years but that they've been separated for the majority of that time.  She cites the fact that she was "too controlling" and that he was never around as the reasons for their separation.  CSW encouraged Pt to take her medications as prescribed and she said that she would try.  Pt stated that she was in pain.  RN was informed and stated that Pt recently refused pain meds.  Pt stated that she was ready for them and agreed to take them.  CSW thanked Pt for her time.  CSW to continue to follow.  Providence Crosby, LCSWA Clinical Social Work [phone]

## 2011-09-21 NOTE — Progress Notes (Signed)
Patient refused medications, stating "I don't want any medications" Patients urine was very cloudy with some sedement

## 2011-09-21 NOTE — Progress Notes (Signed)
Physical Therapy Treatment Patient Details Name: Debra Guerrero MRN: [id number] DOB: 1986/03/05 Today's Date: 09/21/2011 Time: 4098-1191 PT Time Calculation (min): 9 min  PT Assessment / Plan / Recommendation Comments on Treatment Session  Unable to get pt to participate with session today. Refused to ambulate beyond 25 feet with therapy despite max encouragement. Assisted pt back to bed.     Follow Up Recommendations  Skilled nursing facility;Supervision/Assistance - 24 hour    Barriers to Discharge        Equipment Recommendations  Rolling walker with 5" wheels    Recommendations for Other Services    Frequency Min 3X/week   Plan Discharge plan remains appropriate    Precautions / Restrictions Precautions Precautions: Fall Restrictions Weight Bearing Restrictions: No   Pertinent Vitals/Pain     Mobility  Bed Mobility Bed Mobility: Supine to Sit;Sit to Supine Rolling Left: 6: Modified independent (Device/Increase time) Sit to Supine: 6: Modified independent (Device/Increase time) Transfers Transfers: Sit to Stand;Stand to Sit Sit to Stand: 4: Min guard Stand to Sit: 4: Min guard Details for Transfer Assistance: VCs safety. Pt demonstrated bil knee buckling at one point, but was able to correct on her own.  Ambulation/Gait Ambulation/Gait Assistance: 1: +2 Total assist Ambulation/Gait: Patient Percentage: 80% Ambulation Distance (Feet): 25 Feet Assistive device: 2 person hand held assist Ambulation/Gait Assistance Details: Pt refused to ambulate further-"I don't want to walk"-despite Max encouragment from therapist. Noted instability duirng short distance-increased sway.  Gait Pattern: Step-through pattern;Decreased stride length;Decreased step length - right;Decreased step length - left    Exercises     PT Diagnosis:    PT Problem List:   PT Treatment Interventions:     PT Goals Acute Rehab PT Goals Pt will go Supine/Side to Sit: with modified independence PT  Goal: Supine/Side to Sit - Progress: Updated due to goal met Pt will go Sit to Stand: with modified independence PT Goal: Sit to Stand - Progress: Updated due to goal met PT Goal: Ambulate - Progress: Progressing toward goal  Visit Information  Last PT Received On: 09/21/11 Assistance Needed: +2 (safety)    Subjective Data  Subjective: "I don't want to walk....because of my hair" Patient Stated Goal: None stated   Cognition  Area of Impairment: Safety/judgement Arousal/Alertness: Awake/alert Orientation Level: Appears intact for tasks assessed Behavior During Session: Flat affect (uncooperative) Cognition - Other Comments: Pt self-limits participation. Does not responed well to safety/cueing.    Balance     End of Session PT - End of Session Equipment Utilized During Treatment: Gait belt Activity Tolerance:  (Limited by refusal to participate)   GP     Rebeca Alert [facility] 09/21/2011, 11:20 AM [phone]

## 2011-09-22 NOTE — Progress Notes (Signed)
Pt tearful and for something for her nerves.  Text page sent to Dr Izola Price.

## 2011-09-22 NOTE — Progress Notes (Signed)
Patient has been more cooperative tonight than on previous nights. She was able to verbalize why she was here in the hospital. Initially she answered she didn't know when I asked her why she was here. She then said, " I know why I am here, I said that I wanted to kill myself". She denies having suicidal and/or homocidal ideations at this time. She talked about her daughter once I started the conversation. Stating that she had not seen her and missed her.

## 2011-09-22 NOTE — Progress Notes (Signed)
Met with Pt and her brother, Daphine Deutscher.  Pt was eating and communicating well with her brother.  Pt was alert and able to fully participate in our conversation.  Pt reported that she feels good and is able to tolerate food.   Pt expressed her desire to go to [facility], as opposed to [facility], and stated that she will do what ever it takes to go to [facility].  She reported that her goal is to return home.  CSW thanked Pt and her brother for their time.  CSW to continue to follow.  Providence Crosby, LCSWA Clinical Social Work [phone]

## 2011-09-22 NOTE — Progress Notes (Addendum)
F/U Consult note:  Debra Guerrero is sitting on window seat; awake and alert stating she wants to leave.  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  She responds  'I don't see that is relevant'  It is explained that it is an evaluation of how she is able to recall and describe former work process.  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  She says she was overwhelmed with a summer course of multiple assignments every day and she finally stopped doing them because she could not cope.  She explains she is taking two majors: Journalist, newspaper.  She has not contacted her course to explain her absence.  She is encouraged to do so asap.  She says she has not known who is taking care of her daughter Francis Dowse becomes tearful] and feels encouraged because her estranged husband called to say that he will take care of their daughter for a week.  She asks about her medication and the mechanism of Seroquel and benztropine are explained as well as the purpose of each and the dangers to the pt if they are stopped without telling a psychiatrist  She responds with appreciation for the explanations.       Her ability to engage in conversation is steadily improved.  She still has that edge of opposition but this am she was able to drop the confrontation and engage is discussion and explanation of her situation.  She denies suicidal thoughts. AXIS I Schizophrenia, catatonic type, partially resolved  AXIS II Deferred  AXIS III Hypertension with anti-hypertnsive medication, Hypokalemia, resolved   AXIS IV Loss of employment, dropped from college courses, loss of independent care, parenting issues [34 yo daughter [NB She has never mentioned]], resisting treatment and aggressive outbursts  AXIS V GAF 45  RECOMMENDATION:  1.  Try to limit use of Ativan to PRN, 1mg  2 doses max and then decrease to 0.5 mg 2 doses max to discontinue 2.  Pt is no suicidal but sitter is  requested to monitor pt's ability to perform ADLs, Participate in PT and eat, take medications 3.  Please confirm Seroquel XR, quetiapine, is given at 8 pm not 10 pm 4. Suggest change of Cogentin, benztropine, to 0.5 mg 2 X daily. 5. Continue IVC Froilan Mclean J. Ferol Luz, MD Psychiatrist  09/22/2011 12:08 PM

## 2011-09-22 NOTE — Progress Notes (Signed)
Ellyn Hack at [facility] Pt's SED rate.  Per Junious Dresser, Pt now on the wait list.  Per Junious Dresser, not likely that a bed will be available until next week.  CSW to continue to follow.  Providence Crosby, LCSWA Clinical Social Work [phone]

## 2011-09-22 NOTE — Progress Notes (Signed)
Patient ID: Debra Guerrero, female   DOB: 27-Sep-1985, 26 y.o.   MRN: [id number]  [facility] PROGRESS NOTE  Debra Guerrero NWG:[id number] DOB: April 10, 1985 DOA: 09/11/2011 PCP: No primary provider on file.  Brief narrative: Patient is 26 year old female who was initially admitted to behavioral health department on 09/09/2011 with a depression and suicidal ideation. Patient was started on Risperdal and when necessary Haldol. She has developed tachycardia and was referred to emergency department for further evaluation and medical stabilization. During the admission to emergency department patient was found to have possible catatonia as well as hypokalemia, and tachycardia. During the hospital stay patient has been also seen by psychiatry as well as neurology team. Neurology team has signed off as mental status change was attributed to psychiatric etiology. Patient has maintained a flat affect throughout most of the hospitalization and has been placed on IVC. Currently placement is in progress.   Principal Problem:  *Schizophrenia, catatonic type - pt has maintained relatively flat affect throughout the hospital stay - This morning she appears more alert and aware of the surrounding and communicating with me - She appears to be more engaged in conversation this morning compared to yesterday or last week - I have encouraged physical therapy as well as compliance with medical therapy - We'll continue Seroquel and Cogentin, we'll also continue Haldol when necessary - Psychiatry is also following and we certainly appreciate input  Active Problems:  MIGRAINE, COMMON, INTRACTABLE - This appears to be stable and well controlled at this point   Encephalopathy acute - This was determined to be secondary to catatonia - Patient appears more alert and aware of the surrounding this morning - We'll continue current medical regimen   Tachycardia - This has resolved - We will continue to monitor vitals  per floor protocol   Hypokalemia - Potassium has been adequately supplemented - Will need to periodically reassess electrolyte panel  Consultants:  Psychiatry  Procedures:  None  Antibiotics:  None  HPI/Subjective: No events overnight. Patient is more alert and communicative this morning. She denies chest pain or shortness of breath, denies abdominal pain.  Objective: Filed Vitals:   09/21/11 0610 09/21/11 1443 09/21/11 2114 09/22/11 0625  BP: 112/75 114/76 108/73 114/81  Pulse: 94 90 18 94  Temp: 98.4 F (36.9 C) 98.4 F (36.9 C) 99.6 F (37.6 C) 98.3 F (36.8 C)  TempSrc: Oral Oral Oral Oral  Resp: 18 18 18 20   Height:      Weight: 79.6 kg (175 lb 7.8 oz)     SpO2: 96% 99% 95% 97%    Intake/Output Summary (Last 24 hours) at 09/22/11 1035 Last data filed at 09/22/11 0750  Gross per 24 hour  Intake    720 ml  Output   1100 ml  Net   -380 ml    Exam:   General:  Pt is alert, follows commands appropriately, not in acute distress  Cardiovascular: Regular rate and rhythm, S1/S2, no murmurs, no rubs, no gallops  Respiratory: Clear to auscultation bilaterally, no wheezing, no crackles, no rhonchi  Abdomen: Soft, non tender, non distended, bowel sounds present, no guarding  Extremities: No edema, pulses DP and PT palpable bilaterally  Neuro: Grossly nonfocal  Data Reviewed: Basic Metabolic Panel:  Lab 09/19/11 5784 09/18/11 0430 09/16/11 0440  NA 139 141 141  K 3.6 3.1* 3.2*  CL 104 105 107  CO2 25 26 24   GLUCOSE 94 86 95  BUN 6 6 4*  CREATININE 0.53 0.56  0.51  CALCIUM 9.2 9.0 8.7  MG -- -- --  PHOS -- -- --   CBC:  Lab 09/18/11 0430  WBC 11.4*  NEUTROABS --  HGB 12.3  HCT 36.5  MCV 82.8  PLT 216     Studies:  Dg Chest Port 1 View 09/21/2011  IMPRESSION:  No acute abnormalities.    Scheduled Meds:   . benztropine  1 mg Oral BID  . diphenhydrAMINE      . enoxaparin (LOVENOX) injection  40 mg Subcutaneous Q24H  . LORazepam  2  mg Intramuscular Q6H  . potassium chloride  40 mEq Oral Once  . propranolol  10 mg Oral TID  . QUEtiapine  300 mg Oral Q24H     Code Status: Full Family Communication: Pt at bedside Disposition Plan: IVC, when bed available   Debra Presto, MD  [facility] Pager [phone]  If 7PM-7AM, please contact night-coverage [email or web address] Password [email or web address] 09/22/2011, 10:35 AM   LOS: 11 days

## 2011-09-23 MED ORDER — BENZTROPINE MESYLATE 0.5 MG PO TABS
0.5000 mg | ORAL_TABLET | Freq: Two times a day (BID) | ORAL | Status: DC
  Administered 2011-09-23 – 2011-09-24 (×3): 0.5 mg via ORAL
  Filled 2011-09-23 (×4): qty 1

## 2011-09-23 MED ORDER — LORAZEPAM 2 MG/ML IJ SOLN: 1.0000 mg | Freq: Two times a day (BID) | INTRAMUSCULAR | Status: DC

## 2011-09-23 MED ORDER — LORAZEPAM 1 MG PO TABS
1.0000 mg | ORAL_TABLET | Freq: Two times a day (BID) | ORAL | Status: DC
  Administered 2011-09-23 – 2011-09-24 (×2): 1 mg via ORAL
  Filled 2011-09-23 (×3): qty 1

## 2011-09-23 MED ORDER — GI COCKTAIL ~~LOC~~
30.0000 mL | Freq: Once | ORAL | Status: AC
  Administered 2011-09-23: 30 mL via ORAL
  Filled 2011-09-23: qty 30

## 2011-09-23 NOTE — Progress Notes (Signed)
Psych MD recommending renewal of IVC paperwork.  MD signed IVC paperwork.  Notified Magistrate of impending fax.  Confirmed receipt of IVC paperwork by Magistrate.  CSW to continue to follow.  Providence Crosby, LCSWA Clinical Social Work [phone]

## 2011-09-23 NOTE — Progress Notes (Signed)
[facility] PROGRESS NOTE  Debra Guerrero ZOX:[id number] DOB: 02/24/1986 DOA: 09/11/2011 PCP: No primary provider on file.  Assessment/Plan: Principal Problem:  *Schizophrenia, catatonic type Active Problems:  MIGRAINE, COMMON, INTRACTABLE  Encephalopathy acute  Tachycardia  Seasonal allergies  Hypokalemia  Altered mental status  Schizophrenia, catatonic  Schizophrenia, catatonic type  - pt has maintained relatively flat affect throughout the hospital stay however affect has improved today. - This morning she appears more alert and aware of the surrounding and communicating with the family at bedside. - She appears to be more engaged in conversation this morning compared to  last week  - I have encouraged physical therapy as well as compliance with medical therapy  - We'll continue Seroquel and decrease Cogentin to 0.5 twice a day. Will also decrease Ativan to 1 mg twice daily as needed, will also continue Haldol when necessary  - Psychiatry is also following and we certainly appreciate input  Active Problems:  MIGRAINE, COMMON, INTRACTABLE  - This appears to be stable and well controlled at this point  Encephalopathy acute  - This was determined to be secondary to catatonia  - Patient appears more alert and aware of the surrounding this morning  - We'll continue current medical regimen  Tachycardia  - This has resolved. Workup has been negative to date. Continue propranolol 10 mg 3 times a day. - We will continue to monitor vitals per floor protocol  Hypokalemia  - Potassium has been adequately supplemented  - Will need to periodically reassess electrolyte panel Repeat BMET in the morning.   Code Status: Full Family Communication: Family at bedside and mother updated including patient. Disposition Plan: To psych inpatient/BHC when deemed appropriate per psychiatry   Brief narrative: Patient is 26 year old female who was initially admitted to behavioral health  department on 09/09/2011 with a depression and suicidal ideation. Patient was started on Risperdal and when necessary Haldol. She has developed tachycardia and was referred to emergency department for further evaluation and medical stabilization. During the admission to emergency department patient was found to have possible catatonia as well as hypokalemia, and tachycardia. During the hospital stay patient has been also seen by psychiatry as well as neurology team. Neurology team has signed off as mental status change was attributed to psychiatric etiology. Patient has maintained a flat affect throughout most of the hospitalization and has been placed on IVC. Currently placement is in progress.    Consultants: #1 psychiatry: Dr. Ferol Luz #2 infectious diseases: Dr. Ninetta Lights #3 neurology: Dr. Roseanne Reno  Procedures: MRI of the head CT of the head 2-D echo  Antibiotics:  None  HPI/Subjective: Patient more alert and calm and pleasant and more interactive. Patient following commands. Patient  ambulating in the hallway with nurse tech. Patient eating and tolerating oral intake.  Objective: Filed Vitals:   09/22/11 1343 09/22/11 2128 09/23/11 0542 09/23/11 1353  BP: 110/77 106/61 104/60 114/78  Pulse: 91 97 91 97  Temp: 98.3 F (36.8 C) 98.2 F (36.8 C) 98.5 F (36.9 C) 99.1 F (37.3 C)  TempSrc: Oral Oral Oral Oral  Resp: 18 18 18 18   Height:      Weight:      SpO2: 98%   98%    Intake/Output Summary (Last 24 hours) at 09/23/11 1724 Last data filed at 09/23/11 1352  Gross per 24 hour  Intake    360 ml  Output    600 ml  Net   -240 ml    Exam: General: Alert, awake,  oriented x3, in no acute distress. HEENT: No bruits, no goiter. Heart: Regular rate and rhythm, without murmurs, rubs, gallops. Lungs: Clear to auscultation bilaterally. Abdomen: Soft, nontender, nondistended, positive bowel sounds. Extremities: No clubbing cyanosis or edema with positive pedal pulses. Neuro:  Grossly intact, nonfocal. Psychiatry: More alert. More interactive. Affect is less flat.  Data Reviewed: Basic Metabolic Panel:  Lab 09/19/11 1610 09/18/11 0430  NA 139 141  K 3.6 3.1*  CL 104 105  CO2 25 26  GLUCOSE 94 86  BUN 6 6  CREATININE 0.53 0.56  CALCIUM 9.2 9.0  MG -- --  PHOS -- --   Liver Function Tests: No results found for this basename: AST:5,ALT:5,ALKPHOS:5,BILITOT:5,PROT:5,ALBUMIN:5 in the last 168 hours No results found for this basename: LIPASE:5,AMYLASE:5 in the last 168 hours No results found for this basename: AMMONIA:5 in the last 168 hours CBC:  Lab 09/18/11 0430  WBC 11.4*  NEUTROABS --  HGB 12.3  HCT 36.5  MCV 82.8  PLT 216   Cardiac Enzymes: No results found for this basename: CKTOTAL:5,CKMB:5,CKMBINDEX:5,TROPONINI:5 in the last 168 hours BNP (last 3 results) No results found for this basename: PROBNP:3 in the last 8760 hours CBG: No results found for this basename: GLUCAP:5 in the last 168 hours  No results found for this or any previous visit (from the past 240 hour(s)).   Studies: Dg Chest 2 View  09/11/2011  *RADIOLOGY REPORT*  Clinical Data: Medical clearance  CHEST - 2 VIEW  Comparison: 01/15/2011  Findings: Lungs are clear. No pleural effusion or pneumothorax.  Cardiomediastinal silhouette is within normal limits.  Visualized osseous structures are within normal limits.  IMPRESSION: Normal chest radiographs.  Original Report Authenticated By: Charline Bills, M.D.   Ct Head Wo Contrast  09/11/2011  *RADIOLOGY REPORT*  Clinical Data: Altered mental status secondary to a fall.  CT HEAD WITHOUT CONTRAST  Technique:  Contiguous axial images were obtained from the base of the skull through the vertex without contrast.  Comparison: 09/09/2011  Findings: There is no acute intracranial hemorrhage, infarction, or mass lesion.  Brain parenchyma is normal.  Osseous structures are normal.  IMPRESSION: Normal exam, unchanged.  Original Report  Authenticated By: Gwynn Burly, M.D.   Ct Head Wo Contrast  09/09/2011  *RADIOLOGY REPORT*  Clinical Data: Depression and headaches.  CT HEAD WITHOUT CONTRAST  Technique:  Contiguous axial images were obtained from the base of the skull through the vertex without contrast.  Comparison: None.  Findings: No acute intracranial abnormality is present. Specifically, there is no evidence for acute infarct, hemorrhage, mass, hydrocephalus, or extra-axial fluid collection.  The paranasal sinuses and mastoid air cells are clear.  The globes and orbits are intact.  The osseous skull is intact.  IMPRESSION: Negative CT of the head.  Original Report Authenticated By: Jamesetta Orleans. MATTERN, M.D.   Mr Brain Wo Contrast  09/11/2011  *RADIOLOGY REPORT*  Clinical Data: Head trauma.  Mental status changes.  MRI HEAD WITHOUT CONTRAST  Technique:  Multiplanar, multiecho pulse sequences of the brain and surrounding structures were obtained according to standard protocol without intravenous contrast.  Comparison: Head CT 09/11/2011 and 09/09/2011  Findings: The brain has a normal appearance on all pulse sequences without evidence of malformation, atrophy, old or acute infarction, mass lesion, hemorrhage, hydrocephalus or extra-axial collection. No pituitary mass.  No inflammatory sinus disease.  No skull or skull base lesion.  IMPRESSION: Normal MRI of the brain.  Original Report Authenticated By: Thomasenia Sales, M.D.  Dg Chest Port 1 View  09/21/2011  *RADIOLOGY REPORT*  Clinical Data: Placement  PORTABLE CHEST - 1 VIEW  Comparison: Portable exam 1055 hours compared to 09/11/2011  Findings: Normal heart size, mediastinal contours, and pulmonary vascularity. Lungs clear. No pleural effusion or pneumothorax. No acute osseous findings.  IMPRESSION: No acute abnormalities.  Original Report Authenticated By: Lollie Marrow, M.D.    Scheduled Meds:   . benztropine  0.5 mg Oral BID  . enoxaparin (LOVENOX) injection  40 mg  Subcutaneous Q24H  . gi cocktail  30 mL Oral Once  . LORazepam  1 mg Intramuscular BID  . potassium chloride  40 mEq Oral Once  . propranolol  10 mg Oral TID  . QUEtiapine  300 mg Oral Q24H  . DISCONTD: benztropine  1 mg Oral BID  . DISCONTD: LORazepam  2 mg Intramuscular Q6H   Continuous Infusions:   Principal Problem:  *Schizophrenia, catatonic type Active Problems:  MIGRAINE, COMMON, INTRACTABLE  Encephalopathy acute  Tachycardia  Seasonal allergies  Hypokalemia  Altered mental status  Schizophrenia, catatonic    Time spent: 35 mins    [facility]  [facility] Pager [phone]. If 8PM-8AM, please contact night-coverage at [email or web address], password [email or web address] 09/23/2011, 5:24 PM  LOS: 12 days

## 2011-09-23 NOTE — Progress Notes (Signed)
Physical Therapy Treatment Patient Details Name: Debra Guerrero MRN: [id number] DOB: [birth year] Today's Date: 09/23/2011 Time: 8295-6213 PT Time Calculation (min): 10 min  PT Assessment / Plan / Recommendation Comments on Treatment Session  Pt immediately stated "I can walk. I walked last night. I don't want any restrictions (gait belt)." Pt somehat defensive during session but she did participate. Able to encourage pt to ambulate with PT to assess progress with mobility. Much improved-no assisstive device or physical assist needed. No further needs for PT at this time. Recommend continued ambulation with nursing staff supervision.     Follow Up Recommendations  No PT follow up    Barriers to Discharge        Equipment Recommendations  None recommended by PT    Recommendations for Other Services    Frequency     Plan All goals met and education completed, patient dischaged from PT services    Precautions / Restrictions Restrictions Weight Bearing Restrictions: No   Pertinent Vitals/Pain     Mobility  Bed Mobility Bed Mobility: Not assessed Transfers Transfers: Sit to Stand;Stand to Sit Sit to Stand: 7: Independent Stand to Sit: 7: Independent Ambulation/Gait Ambulation/Gait Assistance: 6: Modified independent (Device/Increase time) Ambulation Distance (Feet): 400 Feet Assistive device: None Ambulation/Gait Assistance Details: Good gait speed. No LOB. Pt still has safety sitter present that supervises.  Gait Pattern: Step-through pattern    Exercises     PT Diagnosis:    PT Problem List:   PT Treatment Interventions:     PT Goals Acute Rehab PT Goals PT Goal: Supine/Side to Sit - Progress: Met PT Goal: Sit to Stand - Progress: Met PT Goal: Ambulate - Progress: Met  Visit Information  Last PT Received On: 09/23/11 Assistance Needed: +1    Subjective Data  Subjective: "I dont want to walk with any restrictions (gait belt)" Patient Stated Goal: None stated     Cognition  Overall Cognitive Status: Appears within functional limits for tasks assessed/performed Arousal/Alertness: Awake/alert Orientation Level: Appears intact for tasks assessed Behavior During Session: Agitated (defensive)    Balance  Static Sitting Balance Static Sitting - Balance Support: No upper extremity supported;Feet supported Static Sitting - Level of Assistance: 7: Independent Dynamic Standing Balance Dynamic Standing - Balance Support: No upper extremity supported Dynamic Standing - Level of Assistance: 6: Modified independent (Device/Increase time)  End of Session PT - End of Session Equipment Utilized During Treatment:  (Pt refused use of gait belt) Activity Tolerance: Patient tolerated treatment well Patient left: in chair (sitter present. Pt sitting in window seat)   GP     Rebeca Alert [facility] 09/23/2011, 10:32 AM [phone]

## 2011-09-23 NOTE — Progress Notes (Signed)
CSW accommpained Pt as she walked down the hall.  Pt's affect was bright.  Pt discussed her concerns re: hospitalization of her father and expressed her frustrations with not being able to be with him.  Pt stated that her brother told her that he was doing ok yesterday and she is eager to hear from her brother today re: her dad's status.  Pt reported that she's been eating well and that she bathed today.  She stated, too, that she has been compliant with her medications.  CSW thanked Pt for her time.  CSW to continue to follow.  Providence Crosby, LCSWA Clinical Social Work [phone]

## 2011-09-23 NOTE — Progress Notes (Signed)
Physical Therapy Discharge Patient Details Name: Debra Guerrero MRN: [id number] DOB: 08-18-1985 Today's Date: 09/23/2011 Time: 4098-1191 PT Time Calculation (min): 10 min  Patient discharged from PT services secondary to goals met and no further PT needs identified.  Please see latest therapy progress note for current level of functioning and progress toward goals.      GP     Rebeca Alert [place] 09/23/2011, 10:34 AM  [phone]

## 2011-09-23 NOTE — Progress Notes (Signed)
Debra Guerrero is sitting on window seat.  She has spontaneous speech and good eye contact for most of the interview.  She has some labile affect when whispers that her father is in a [facility] with 'colitis and bleeding;  She says he and mother have lived separately.  She worries that she cannot see him.  She is asked how she slept.  She says she slept better last night.  [timing of SGA was set at 8 p with improved restorative sleep].  Psych CSW note is appreciated.  She says she ate breakfast and washed.  She is seen walking in the hall.  She continues to improved Yet, until this is a verifiable stable pattern of behavior no decision regarding discharge needs to be made.  This is to protect her from a serious relapse.  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  She is encourage to do so.  PT IS SEEN THIS AM ~ 10:30 am 09/23/11. RECOMMENDATION:  1.  Suggest continue the IVC 2.  No further suggestions today.  If she remains calm aX 24 hrs, consider decrease of Ativan to 0.5 mg 2 X daily prn Thao Vanover J. Ferol Luz, MD Psychiatrist  09/23/2011 9:26 PM

## 2011-09-23 NOTE — Progress Notes (Signed)
OT Cancellation Note  Treatment cancelled today due to pt is independent with all ADLs/mobility. Observed pt walking independently in the halls, per sitter,pt has been showering without any assist. Will d/c OT at this time. Pt presents with no further needs.  Patricio Popwell A OTR/L [phone] 09/23/2011, 3:07 PM

## 2011-09-24 DIAGNOSIS — G43019 Migraine without aura, intractable, without status migrainosus: Secondary | ICD-10-CM

## 2011-09-24 LAB — BASIC METABOLIC PANEL
BUN: 8 mg/dL (ref 6–23)
Chloride: 105 mEq/L (ref 96–112)
Glucose, Bld: 88 mg/dL (ref 70–99)
Potassium: 3.8 mEq/L (ref 3.5–5.1)

## 2011-09-24 MED ORDER — QUETIAPINE FUMARATE ER 300 MG PO TB24: 300.0000 mg | ORAL_TABLET | ORAL | Status: DC

## 2011-09-24 MED ORDER — LORAZEPAM 1 MG PO TABS: 0.5000 mg | ORAL_TABLET | Freq: Every day | ORAL | Status: AC | PRN

## 2011-09-24 MED ORDER — PROPRANOLOL HCL 10 MG PO TABS: 10.0000 mg | ORAL_TABLET | Freq: Three times a day (TID) | ORAL | Status: DC

## 2011-09-24 MED ORDER — HYDROXYZINE PAMOATE 25 MG PO CAPS: 25.0000 mg | ORAL_CAPSULE | Freq: Three times a day (TID) | ORAL | Status: AC

## 2011-09-24 NOTE — Progress Notes (Signed)
ED CSW was contacted by Renee Pain at [facility] who states that the pt is officially accepted at this time, that all required documents have been reviewed. ED CSW has contacted the sheriff for transport. RN made aware. No further needs identified at this time.

## 2011-09-24 NOTE — Progress Notes (Signed)
Per psych MD, Pt is appropriate for [facility].  Notified Tasha at [facility].  CSW to continue to follow.  Providence Crosby, LCSWA Clinical Social Work [phone]

## 2011-09-24 NOTE — Progress Notes (Signed)
ED CSW was contacted by pt's RN regarding pt's needs at this time as she is pending [facility]. ED CSW was notified that the pt's family is requesting that the pt be discharged to their care. RN and Dr. Janee Morn requested ED CSW contact Dr. Ferol Luz to get further recommendations regarding pt's level of care and discuss the parent's desire to have the pt discharge home in their care. Per Dr. Ferol Luz, pt needs inpatient hospitalization and is under IVC and since she has been declined by [facility], she recommends [facility]. Dr. Ferol Luz also added that it would be up to Dr.Thompson if he wanted to reverse the pt's IVC papers.   ED CSW contacted pt's RN and provided updated information.  ED CSW faxed pt's discharge summary, MAR, legal documents and vital signs to [facility] for review. ED CSW is currently awaiting a return call from [facility] at this time.    Please consult back to ED CSW as needed.  Nevada Crane, MSW, [facility] Clinical Social Worker [phone]

## 2011-09-24 NOTE — Progress Notes (Addendum)
Follow-up Consult note:  Psych CSW note is appreciated. Discussed with Dr. Janee Morn Pt has made progressive improvement in affect, cognitive function, ADLs, eating and walking well OT and PT have completed evaluations and services are discontinued.  Pt is not longer appropriate with her former combative and oppositional behavior for [facility].  Now that she can process information intellectually, speak and asks appropriate questions.  It is considered more beneficial to refer her to [facility]  Where the psycho-education about her Catatonia and her medications can be more thoroughly explained and supported for continuity of care.  Pt's comments reflect religion may supercede any protection from SGA and mood stabilizing medications.  She has said, "I hear God speak to me and if God tells me not to take medications, I will stop." She is working on two degrees and has the intellectual ability to learn about Schizophrenia and the protective effect of medications that keep symptoms under control.  She is receptive to these discussions.  Since these symptoms disrupted her education, work, care of her daughter and communication with anyone if is important for her to understand and decide how to prevent recurrence of these symptoms.  RECOMMENDATION:  1.  IVC is maintained for concern she will digress from treatment on religious influences and increase the risk of recurrent symptoms.  2.  Consider focus of transfer to [facility] instead of [facility] for group and individual therapy and continued monitoring of her SGA Seroquel XR 300 mg 8 pm.  She may not require the protective Rx benztropine. 3.  Taper benzo Ativan to 0.5 mg daily prn. 4.  No further psychiatric needs identified unless requested  MD Psychiatrist signs off Cesilia Shinn J. Ferol Luz, MD Psychiatrist  09/24/2011 12:40 PM

## 2011-09-24 NOTE — Progress Notes (Signed)
[facility] states that patient is not appropriate for admission and is [facility]. [facility]. Faxed appropriate information.  Jakarri Lesko C. Pryor Guettler MSW, LCSW [phone]

## 2011-09-24 NOTE — Progress Notes (Signed)
Pt has updated IVC papers completed and ED CSW confirmed that IVC's were received by [facility].   Please consult back to ED CSW as needed.    Nevada Crane, MSW, [facility] Clinical Social Worker [phone]

## 2011-09-24 NOTE — Progress Notes (Signed)
Per Debra Guerrero, Pt close to the top of the wait list.  Debra Guerrero states that Pt may be able to d/c this week.  CSW to continue to follow.  Providence Crosby, LCSWA Clinical Social Work [phone]

## 2011-09-24 NOTE — Progress Notes (Signed)
Met with Pt to discuss possibility of [facility] admission.  Answered Pt's questions.  Notified RN of possible [facility] admission.  CSW to continue to follow.  Providence Crosby, LCSWA Clinical Social Work [phone]

## 2011-09-24 NOTE — Progress Notes (Signed)
Notified [facility] of patient being noted to be appropriate for them. They will notify CSW if bed is available.  Carmyn Hamm C. Eshani Maestre MSW, LCSW [phone]

## 2011-09-24 NOTE — Discharge Summary (Signed)
Physician Discharge Summary  ISLAM VILLESCAS ZOX:[id number] DOB: [birth year] DOA: 09/11/2011  PCP: No primary provider on file.  Admit date: 09/11/2011 Discharge date: 09/24/2011  Recommendations for Outpatient Follow-up:  #1 [facility] will need to followup with the PCP as outpatient to monitor her tachycardia.  Discharge Diagnoses:  Principal Problem:  *Schizophrenia, catatonic type Active Problems:  MIGRAINE, COMMON, INTRACTABLE  Encephalopathy acute  Tachycardia  Seasonal allergies  Hypokalemia  Altered mental status  Schizophrenia, catatonic   Discharge Condition: Stable and improved  Diet recommendation: Regular diet  History of present illness:  Debra Guerrero is a 26 year old African American female with history of asthma, migraine headaches, depression who was admitted to behavioral health center one day prior to admission secondary to paranoid schizophrenia and suicidal ideation. Patient was transferred from behavioral health to the ED secondary to tachycardia and lethargic he. Per ED report when EMS was bringing the patient heart rate was initially in the 160s and went down to the 140s. Patient is not answering any questions in the room however does track. Patient's mother is at the bedside and history was obtained from the mother. Per patient's mother over the past week the patient had been going to [facility] for her depression however mother felt patient may be more depressed. Patient to per mother was in the living room hadn't been watching television however would be staring straight ahead and stated that she didn't know what was wrong with her. Patient stopped going to school and patient stopped taking her daughter to school. Patient was having some poor hygiene and not changing her clothes with some numerous occasions of confusion. Patient was subsequently brought to the ED and did have some suicidal ideation at that time. Patient was subsequently  admitted to behavioral Health Center one day prior to admission secondary to paranoid schizophrenia. Patient's mother denies patient has had any fevers, no chills, no chest pain, no abdominal pain, no shortness of breath, no nausea, no vomiting, no diarrhea, no constipation, no dysuria, no weakness. No other associated symptoms. Patient's mother states patient did have upper respiratory symptoms approximately 10 days prior to admission. In the ED patient was noted to be more catatonic. However prior to my evaluation was noted that patient did ambulate back and forth to the bathroom.   Hospital Course:  #1 schizophrenia, catatonic type Patient was admitted from the ED secondary to acute encephalopathy noted which was catatonia. Do too patient's presentation there was concern for other possible medical etiologies. CT of the head was done which was negative. MRI of the head was also done which was negative. Patient was seen in consultation by Dr. Jeannett Senior of neurology who felt that patient's condition was secondary to psychiatric illness and not neurological in nature. An RPR was done which came back as 1:1 and a such an ID consultation was obtained. Patient was seen in consultation by Dr. Johny Sax of infectious diseases who stated that patient was previously treated at [facility] under the name of Dhamar Gregory on 10/22/2004 when she had a 1-64 RPR/TPPA positive was treated with doxycycline for 14 days beginning 10/23/2004. It was felt patient's positive RPR was a low level and an indication of a previous infection. It was felt that patient had been adequately treated in the past and a such there was no indication for any further treatment. Dr. Ninetta Lights discussed the case with the [facility] Communicable Disease Branch. During patient's workup: No signs or symptoms of infection. Patient remained afebrile.  However patient did have bouts of agitation and combativeness and a such a psychiatric consultation was  obtained. Patient was seen initially in consultation by Dr. Koren Shiver and followed by Dr. Ferol Luz. It was also reported that prior to patient's presentation she was refusing medications at [facility] as well. It was felt that patient did not have a neuroleptic malignant syndrome and was just catatonic. Placed on Cogentin which was further titrated and discontinued by day of discharge. She was also placed on Haldol as needed as well as Ativan as needed. Patient also did have some bouts of hostile undertones and some evidence of auditory and visual hallucinations per psychiatry. Her dose of Seroquel was subsequently increased from 200 mg to 300 mg at bedtime. Patient also during the hospitalization had some bouts of refusing breakfast lunch and some medications at one point she also refused to shower. Patient was placed under involuntary commitment while in the hospital and was monitored. Patient started to improve clinically and also started to care for herself with ADLs. Patient started ambulating in the hallways and became cooperative. Patient started to take her medications and eat her food. Patient's conversation improved without being oppositional as well. It was felt that Seroquel as well as Cogentin seemed to have relieved her of her catatonic state. Patient continued to show daily progress appointment in the hallucinations. Patient did have some occasions of bizarre and oppositional and at times combative behavior. It was recommended to keep patient on involuntary commitment with a sitter. Benzodiazepines were minimized and slowly tapered down 2.5 mg twice daily and she'll be discharged on 0.5 mg daily as needed. Patient was followed by psychiatry daily it was felt patient was not suicidal but it was recommended to continue a sitter to monitor patient's ability to perform ADLs to participate in PT and take her medications. Cogentin dose was decreased. It was initially felt by the following psychiatrist that  patient may be able to return back to the behavioral health center however patient was refused admission back to the behavioral Health Center for further management of her schizophrenia and it was recommended that patient will be best served at a state facility. Patient will subsequently be discharged to the state facility at [facility]. Patient will be discharged in stable and improved condition.  #2 tachycardia On presentation to the ED patient was noted to have a heart rate in the 130s to the 140s. Patient had been transferred from behavioral Health Center secondary to her tachycardia. Cardiac enzymes were cycled which were negative x3. Patient was placed on a telemetry monitor. A d-dimer was obtained which was negative. Chest x-ray which was done was negative. Patient did not have any signs or symptoms of infection. Patient's hemoglobin remained stable. A 2-D echo was obtained which was within normal limits with an EF of 60-65% with no wall motion abnormalities. EKG which was done did show a sinus tachycardia. It was felt patient's tachycardia could likely be anxiety induced. The patient was placed on propranolol 10 mg 3 times daily with resolution of her tachycardia. Patient will be discharged on propranolol 10 mg 3 times daily.  #3 hypokalemia During the hospitalization patient was noted to be hypokalemic. Patient's potassium was repleted. Patient's hypokalemia had resolved by day of discharge.   Procedures:  2-D echo was done on 09/12/2011  CT of the head was done on 09/11/2011  MRI of the head was done on 09/11/2011   Consultations:  #1 neurology: Dr. Roseanne Reno  #2 infectious diseases: Dr.  Hatcher  #3 psychiatry: Dr.Kuroski-Mazzei/Dr Bogard  Discharge Exam: Filed Vitals:   09/24/11 1424  BP: 115/66  Pulse: 87  Temp: 97.9 F (36.6 C)  Resp: 18   Filed Vitals:   09/23/11 1353 09/23/11 2138 09/24/11 0651 09/24/11 1424  BP: 114/78 117/65 115/66 115/66  Pulse: 97 107 82 87    Temp: 99.1 F (37.3 C) 98.2 F (36.8 C) 98.8 F (37.1 C) 97.9 F (36.6 C)  TempSrc: Oral Oral Oral Oral  Resp: 18 18 16 18   Height:      Weight:      SpO2: 98% 99% 98% 100%   Subjective: No complaints. Patient walking in hallways. Patient, resident of following commands. Patient is very tearful had been discharged to a state facility. General: Alert, awake, oriented x3, in no acute distress. HEENT: No bruits, no goiter. Heart: Regular rate and rhythm, without murmurs, rubs, gallops. Lungs: Clear to auscultation bilaterally. Abdomen: Soft, nontender, nondistended, positive bowel sounds. Extremities: No clubbing cyanosis or edema with positive pedal pulses. Neuro: Grossly intact, nonfocal.   Discharge Instructions  Discharge Orders    Future Orders Please Complete By Expires   Diet general      Increase activity slowly        Medication List  As of 09/24/2011  4:24 PM   TAKE these medications         albuterol 108 (90 BASE) MCG/ACT inhaler   Commonly known as: PROVENTIL HFA;VENTOLIN HFA   Inhale 2 puffs into the lungs every 6 (six) hours as needed. Shortness of breath      albuterol (2.5 MG/3ML) 0.083% nebulizer solution   Commonly known as: PROVENTIL   Take 2.5 mg by nebulization every 6 (six) hours as needed.      hydrOXYzine 25 MG capsule   Commonly known as: VISTARIL   Take 1 capsule (25 mg total) by mouth 3 (three) times daily.      LORazepam 1 MG tablet   Commonly known as: ATIVAN   Take 0.5 tablets (0.5 mg total) by mouth daily as needed for anxiety.      propranolol 10 MG tablet   Commonly known as: INDERAL   Take 1 tablet (10 mg total) by mouth 3 (three) times daily.      QUEtiapine 300 MG 24 hr tablet   Commonly known as: SEROQUEL XR   Take 1 tablet (300 mg total) by mouth daily. Please give nightly at 8pm           Follow-up Information    Please follow up. (f/u with MD at facility)           The results of significant diagnostics from this  hospitalization (including imaging, microbiology, ancillary and laboratory) are listed below for reference.    Significant Diagnostic Studies: Dg Chest 2 View  09/11/2011  *RADIOLOGY REPORT*  Clinical Data: Medical clearance  CHEST - 2 VIEW  Comparison: 01/15/2011  Findings: Lungs are clear. No pleural effusion or pneumothorax.  Cardiomediastinal silhouette is within normal limits.  Visualized osseous structures are within normal limits.  IMPRESSION: Normal chest radiographs.  Original Report Authenticated By: Charline Bills, M.D.   Ct Head Wo Contrast  09/11/2011  *RADIOLOGY REPORT*  Clinical Data: Altered mental status secondary to a fall.  CT HEAD WITHOUT CONTRAST  Technique:  Contiguous axial images were obtained from the base of the skull through the vertex without contrast.  Comparison: 09/09/2011  Findings: There is no acute intracranial hemorrhage, infarction, or mass lesion.  Brain parenchyma is normal.  Osseous structures are normal.  IMPRESSION: Normal exam, unchanged.  Original Report Authenticated By: Gwynn Burly, M.D.   Ct Head Wo Contrast  09/09/2011  *RADIOLOGY REPORT*  Clinical Data: Depression and headaches.  CT HEAD WITHOUT CONTRAST  Technique:  Contiguous axial images were obtained from the base of the skull through the vertex without contrast.  Comparison: None.  Findings: No acute intracranial abnormality is present. Specifically, there is no evidence for acute infarct, hemorrhage, mass, hydrocephalus, or extra-axial fluid collection.  The paranasal sinuses and mastoid air cells are clear.  The globes and orbits are intact.  The osseous skull is intact.  IMPRESSION: Negative CT of the head.  Original Report Authenticated By: Jamesetta Orleans. MATTERN, M.D.   Mr Brain Wo Contrast  09/11/2011  *RADIOLOGY REPORT*  Clinical Data: Head trauma.  Mental status changes.  MRI HEAD WITHOUT CONTRAST  Technique:  Multiplanar, multiecho pulse sequences of the brain and surrounding structures  were obtained according to standard protocol without intravenous contrast.  Comparison: Head CT 09/11/2011 and 09/09/2011  Findings: The brain has a normal appearance on all pulse sequences without evidence of malformation, atrophy, old or acute infarction, mass lesion, hemorrhage, hydrocephalus or extra-axial collection. No pituitary mass.  No inflammatory sinus disease.  No skull or skull base lesion.  IMPRESSION: Normal MRI of the brain.  Original Report Authenticated By: Thomasenia Sales, M.D.   Dg Chest Port 1 View  09/21/2011  *RADIOLOGY REPORT*  Clinical Data: Placement  PORTABLE CHEST - 1 VIEW  Comparison: Portable exam 1055 hours compared to 09/11/2011  Findings: Normal heart size, mediastinal contours, and pulmonary vascularity. Lungs clear. No pleural effusion or pneumothorax. No acute osseous findings.  IMPRESSION: No acute abnormalities.  Original Report Authenticated By: Lollie Marrow, M.D.    Microbiology: No results found for this or any previous visit (from the past 240 hour(s)).   Labs: Basic Metabolic Panel:  Lab 09/24/11 9604 09/19/11 0611 09/18/11 0430  NA 139 139 141  K 3.8 3.6 3.1*  CL 105 104 105  CO2 26 25 26   GLUCOSE 88 94 86  BUN 8 6 6   CREATININE 0.67 0.53 0.56  CALCIUM 9.0 9.2 9.0  MG -- -- --  PHOS -- -- --   Liver Function Tests: No results found for this basename: AST:5,ALT:5,ALKPHOS:5,BILITOT:5,PROT:5,ALBUMIN:5 in the last 168 hours No results found for this basename: LIPASE:5,AMYLASE:5 in the last 168 hours No results found for this basename: AMMONIA:5 in the last 168 hours CBC:  Lab 09/18/11 0430  WBC 11.4*  NEUTROABS --  HGB 12.3  HCT 36.5  MCV 82.8  PLT 216   Cardiac Enzymes: No results found for this basename: CKTOTAL:5,CKMB:5,CKMBINDEX:5,TROPONINI:5 in the last 168 hours BNP: BNP (last 3 results) No results found for this basename: PROBNP:3 in the last 8760 hours CBG: No results found for this basename: GLUCAP:5 in the last 168  hours  Time coordinating discharge:  Signed:  Carrol Hougland  [facility] 09/24/2011, 4:24 PM

## 2011-09-29 NOTE — Discharge Summary (Signed)
Debra Guerrero DOB: May 01, 1985 Debra Guerrero had been admitted to the [address] unit for further evaluation and treatment of persistent suicidal ideations. Early on the morning of the 5th, this on call provider was notified by the nursing staff that the patient had had a witnessed seizure.  At this point the RN is instructed to send the patient to the [facility] for further evaluation.  The patient was evaluated by [facility] and admitted to in-patient medicine for further evaluation and treatment of new onset seizure disorder  Debra Guerrero was discharged from the Medical in patient unit to [facility].  She did not return to the [facility].  [name] [facility] 09/29/2011

## 2014-01-23 ENCOUNTER — Emergency Department (HOSPITAL_BASED_OUTPATIENT_CLINIC_OR_DEPARTMENT_OTHER): Payer: PRIVATE HEALTH INSURANCE

## 2014-01-23 ENCOUNTER — Emergency Department (HOSPITAL_BASED_OUTPATIENT_CLINIC_OR_DEPARTMENT_OTHER)
Admission: EM | Admit: 2014-01-23 | Discharge: 2014-01-23 | Disposition: A | Discharge status: Home / Self Care | Payer: PRIVATE HEALTH INSURANCE | Attending: Emergency Medicine | Admitting: Emergency Medicine

## 2014-01-23 ENCOUNTER — Encounter (HOSPITAL_BASED_OUTPATIENT_CLINIC_OR_DEPARTMENT_OTHER): Payer: Self-pay

## 2014-01-23 DIAGNOSIS — Z791 Long term (current) use of non-steroidal anti-inflammatories (NSAID): Secondary | ICD-10-CM | POA: Insufficient documentation

## 2014-01-23 DIAGNOSIS — R05 Cough: Secondary | ICD-10-CM | POA: Diagnosis present

## 2014-01-23 DIAGNOSIS — J069 Acute upper respiratory infection, unspecified: Secondary | ICD-10-CM | POA: Diagnosis not present

## 2014-01-23 DIAGNOSIS — Z79899 Other long term (current) drug therapy: Secondary | ICD-10-CM | POA: Insufficient documentation

## 2014-01-23 DIAGNOSIS — J45909 Unspecified asthma, uncomplicated: Secondary | ICD-10-CM | POA: Insufficient documentation

## 2014-01-23 DIAGNOSIS — Z87891 Personal history of nicotine dependence: Secondary | ICD-10-CM | POA: Diagnosis not present

## 2014-01-23 DIAGNOSIS — F329 Major depressive disorder, single episode, unspecified: Secondary | ICD-10-CM | POA: Diagnosis not present

## 2014-01-23 DIAGNOSIS — R059 Cough, unspecified: Secondary | ICD-10-CM

## 2014-01-23 LAB — RAPID STREP SCREEN (MED CTR MEBANE ONLY): STREPTOCOCCUS, GROUP A SCREEN (DIRECT): NEGATIVE

## 2014-01-23 MED ORDER — NAPROXEN 500 MG PO TABS: 500.0000 mg | ORAL_TABLET | Freq: Two times a day (BID) | ORAL | Status: DC

## 2014-01-23 MED ORDER — DM-GUAIFENESIN ER 30-600 MG PO TB12: 1.0000 | ORAL_TABLET | Freq: Two times a day (BID) | ORAL | Status: DC

## 2014-01-23 NOTE — Discharge Instructions (Signed)
Rapid strep test and chest x-ray were negative. No evidence of strep throat. And no evidence of pneumonia. Take Mucinex DM for cough and the phlegm. Take Naprosyn as needed for the sore throat.

## 2014-01-23 NOTE — ED Notes (Addendum)
Cough and diarrhea that started yesterday.  Sore throat x 3 weeks intermittently. Pt works at a daycare.

## 2014-01-23 NOTE — ED Provider Notes (Signed)
CSN: [id number]     Arrival date & time 01/23/14  0720 History   First MD Initiated Contact with Patient 01/23/14 [id number]     Chief Complaint  Patient presents with  . Cough     (Consider location/radiation/quality/duration/timing/severity/associated sxs/prior Treatment) Patient is a 28 y.o. female presenting with cough. The history is provided by the patient.  Cough Associated symptoms: sore throat   Associated symptoms: no chest pain, no fever, no headaches, no rash and no shortness of breath   the patient with three-week history of sore throat intermittently but now becoming more constant. Also with cough and diarrhea that started yesterday. The cough is productive for green mucus. Occasional streaks of blood. The diarrhea has no blood in it. 6 episodes of bowel movements and chest are day. No nausea no vomiting. No fevers. Patient works at daycare.  Past Medical History  Diagnosis Date  . Asthma   . Headache(784.0)   . Depression   . Seasonal allergies 09/11/2011  . 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 No pertinent past surgical history. No family history on file. History  Substance Use Topics  . Smoking status: Former Smoker    Types: Cigarettes  . Smokeless tobacco: Never Used  . Alcohol Use: No   OB History    No data available     Review of Systems  Constitutional: Negative for fever.  HENT: Positive for congestion and sore throat. Negative for sinus pressure and trouble swallowing.   Eyes: Negative for redness.  Respiratory: Positive for cough. Negative for shortness of breath.   Cardiovascular: Negative for chest pain.  Gastrointestinal: Positive for diarrhea. Negative for nausea, vomiting and abdominal pain.  Genitourinary: Negative for dysuria.  Musculoskeletal: Negative for back pain.  Skin: Negative for rash.  Neurological: Negative for headaches.  Hematological: Does not bruise/bleed easily.  Psychiatric/Behavioral: Negative for  confusion.      Allergies  Review of patient's allergies indicates no known allergies.  Home Medications   Prior to Admission medications   Medication Sig Start Date End Date Taking? Authorizing Provider  budesonide-formoterol (SYMBICORT) 160-4.5 MCG/ACT inhaler Inhale 2 puffs into the lungs 2 (two) times daily.   Yes Historical Provider, MD  albuterol (PROVENTIL HFA;VENTOLIN HFA) 108 (90 BASE) MCG/ACT inhaler Inhale 2 puffs into the lungs every 6 (six) hours as needed. Shortness of breath    Historical Provider, MD  albuterol (PROVENTIL) (2.5 MG/3ML) 0.083% nebulizer solution Take 2.5 mg by nebulization every 6 (six) hours as needed.      Historical Provider, MD  dextromethorphan-guaiFENesin (MUCINEX DM) 30-600 MG per 12 hr tablet Take 1 tablet by mouth 2 (two) times daily. 01/23/14   Vanetta MuldersScott Haruto Demaria, MD  naproxen (NAPROSYN) 500 MG tablet Take 1 tablet (500 mg total) by mouth 2 (two) times daily. 01/23/14   Vanetta MuldersScott Anae Hams, MD  propranolol (INDERAL) 10 MG tablet Take 1 tablet (10 mg total) by mouth 3 (three) times daily. 09/24/11 09/23/12  Rodolph Bonganiel Thompson V, MD  QUEtiapine (SEROQUEL XR) 300 MG 24 hr tablet Take 1 tablet (300 mg total) by mouth daily. Please give nightly at 8pm 09/24/11 10/24/11  Rodolph Bonganiel Thompson V, MD   BP 125/79 mmHg  Pulse 84  Temp(Src) 98.8 F (37.1 C) (Oral)  Resp 18  Ht 5\' 5"  (1.651 m)  Wt 206 lb (93.441 kg)  BMI 34.28 kg/m2  SpO2 100%  LMP 01/16/2014 Physical Exam  Constitutional: She is oriented to person, place, and time. She appears well-developed and well-nourished. No distress.  HENT:  Head: Normocephalic and atraumatic.  Mouth/Throat: Oropharynx is clear and moist. No oropharyngeal exudate.  Erythema to the left side of throat.  Eyes: Conjunctivae and EOM are normal. Pupils are equal, round, and reactive to light.  Neck: Normal range of motion.  Pulmonary/Chest: Effort normal and breath sounds normal. No respiratory distress.  Abdominal: Soft. Bowel  sounds are normal. There is no tenderness.  Musculoskeletal: Normal range of motion.  Neurological: She is alert and oriented to person, place, and time. No cranial nerve deficit. She exhibits normal muscle tone. Coordination normal.  Skin: Skin is warm. No rash noted.  Nursing note and vitals reviewed.   ED Course  Procedures (including critical care time) Labs Review Labs Reviewed  RAPID STREP SCREEN  CULTURE, GROUP A STREP    Imaging Review Dg Chest 2 View  01/23/2014   CLINICAL DATA:  Cough, congestion and weakness.  EXAM: CHEST  2 VIEW  COMPARISON:  09/21/2011  FINDINGS: The heart size and mediastinal contours are within normal limits. Both lungs are clear. The visualized skeletal structures are unremarkable.  IMPRESSION: Normal chest x-ray.   Electronically Signed   By: Loralie ChampagneMark  Gallerani M.D.   On: 01/23/2014 08:03     EKG Interpretation None      MDM   Final diagnoses:  URI (upper respiratory infection)    The patient with sore throat for 3 weeks which is getting worse. Started with the productive cough some blood tinging and loose bowel movements yesterday. About 6 episodes. No blood in the bowel movements. Workup here chest x-ray negative for pneumonia. Rapid strep test was negative. Symptoms seem to be consistent with upper respiratory infection and perhaps some bronchitis component. Patient's nontoxic no acute distress here.    Vanetta MuldersScott Tina Gruner, MD 01/23/14 [id number]

## 2014-01-25 LAB — CULTURE, GROUP A STREP

## 2015-01-04 ENCOUNTER — Encounter (HOSPITAL_BASED_OUTPATIENT_CLINIC_OR_DEPARTMENT_OTHER): Payer: Self-pay | Admitting: Emergency Medicine

## 2015-01-04 ENCOUNTER — Emergency Department (HOSPITAL_BASED_OUTPATIENT_CLINIC_OR_DEPARTMENT_OTHER)
Admission: EM | Admit: 2015-01-04 | Discharge: 2015-01-04 | Disposition: A | Discharge status: Home / Self Care | Payer: PRIVATE HEALTH INSURANCE | Attending: Emergency Medicine | Admitting: Emergency Medicine

## 2015-01-04 DIAGNOSIS — Z791 Long term (current) use of non-steroidal anti-inflammatories (NSAID): Secondary | ICD-10-CM | POA: Insufficient documentation

## 2015-01-04 DIAGNOSIS — J45909 Unspecified asthma, uncomplicated: Secondary | ICD-10-CM | POA: Insufficient documentation

## 2015-01-04 DIAGNOSIS — B9689 Other specified bacterial agents as the cause of diseases classified elsewhere: Secondary | ICD-10-CM

## 2015-01-04 DIAGNOSIS — F329 Major depressive disorder, single episode, unspecified: Secondary | ICD-10-CM | POA: Insufficient documentation

## 2015-01-04 DIAGNOSIS — N760 Acute vaginitis: Secondary | ICD-10-CM | POA: Insufficient documentation

## 2015-01-04 DIAGNOSIS — B3731 Acute candidiasis of vulva and vagina: Secondary | ICD-10-CM

## 2015-01-04 DIAGNOSIS — Z79899 Other long term (current) drug therapy: Secondary | ICD-10-CM | POA: Insufficient documentation

## 2015-01-04 DIAGNOSIS — B373 Candidiasis of vulva and vagina: Secondary | ICD-10-CM | POA: Insufficient documentation

## 2015-01-04 DIAGNOSIS — Z87891 Personal history of nicotine dependence: Secondary | ICD-10-CM | POA: Insufficient documentation

## 2015-01-04 DIAGNOSIS — Z3202 Encounter for pregnancy test, result negative: Secondary | ICD-10-CM | POA: Insufficient documentation

## 2015-01-04 LAB — URINE MICROSCOPIC-ADD ON

## 2015-01-04 LAB — URINALYSIS, ROUTINE W REFLEX MICROSCOPIC
Bilirubin Urine: NEGATIVE
Glucose, UA: NEGATIVE mg/dL
Hgb urine dipstick: NEGATIVE
Ketones, ur: NEGATIVE mg/dL
NITRITE: NEGATIVE
PROTEIN: NEGATIVE mg/dL
SPECIFIC GRAVITY, URINE: 1.012 (ref 1.005–1.030)
UROBILINOGEN UA: 0.2 mg/dL (ref 0.0–1.0)
pH: 6.5 (ref 5.0–8.0)

## 2015-01-04 LAB — GC/CHLAMYDIA PROBE AMP (~~LOC~~) NOT AT ARMC
CHLAMYDIA, DNA PROBE: NEGATIVE
NEISSERIA GONORRHEA: NEGATIVE

## 2015-01-04 LAB — WET PREP, GENITAL
TRICH WET PREP: NONE SEEN
YEAST WET PREP: NONE SEEN

## 2015-01-04 LAB — PREGNANCY, URINE: PREG TEST UR: NEGATIVE

## 2015-01-04 MED ORDER — METRONIDAZOLE 500 MG PO TABS: 500.0000 mg | ORAL_TABLET | Freq: Two times a day (BID) | ORAL | Status: DC

## 2015-01-04 MED ORDER — FLUCONAZOLE 50 MG PO TABS
150.0000 mg | ORAL_TABLET | Freq: Once | ORAL | Status: AC
  Administered 2015-01-04: 150 mg via ORAL
  Filled 2015-01-04 (×2): qty 1

## 2015-01-04 NOTE — ED Notes (Signed)
Pt c/o vaginal itching and burning that has been intermittent. Denies vaginal discharge.

## 2015-01-04 NOTE — ED Provider Notes (Signed)
CSN: [id number]     Arrival date & time 01/04/15  [id number] History   First MD Initiated Contact with Patient 01/04/15 [phone]     Chief Complaint  Patient presents with  . Vaginal Itching     (Consider location/radiation/quality/duration/timing/severity/associated sxs/prior Treatment) HPI  This is a 29 year old female with a three-day history of vulvovaginal irritation. She is not sure if the symptoms are like that of a yeast infection. She describes the sensation as both itching and burning. Symptoms are mild to moderate. She's having some burning after urination. She denies fever, chills, nausea, vomiting, diarrhea or vaginal bleeding.  Past Medical History  Diagnosis Date  . Asthma   . Headache(784.0)   . Depression   . Seasonal allergies 09/11/2011  . Schizophrenia, catatonic type (HCC) 09/10/2011   History reviewed. No pertinent past surgical history. No family history on file. Social History  Substance Use Topics  . Smoking status: Former Smoker    Types: Cigarettes  . Smokeless tobacco: Never Used  . Alcohol Use: No   OB History    No data available     Review of Systems  All other systems reviewed and are negative.   Allergies  Review of patient's allergies indicates no known allergies.  Home Medications   Prior to Admission medications   Medication Sig Start Date End Date Taking? Authorizing Provider  albuterol (PROVENTIL HFA;VENTOLIN HFA) 108 (90 BASE) MCG/ACT inhaler Inhale 2 puffs into the lungs every 6 (six) hours as needed. Shortness of breath    Historical Provider, MD  albuterol (PROVENTIL) (2.5 MG/3ML) 0.083% nebulizer solution Take 2.5 mg by nebulization every 6 (six) hours as needed.      Historical Provider, MD  budesonide-formoterol (SYMBICORT) 160-4.5 MCG/ACT inhaler Inhale 2 puffs into the lungs 2 (two) times daily.    Historical Provider, MD  dextromethorphan-guaiFENesin (MUCINEX DM) 30-600 MG per 12 hr tablet Take 1 tablet by mouth 2 (two) times daily.  01/23/14   Vanetta MuldersScott Zackowski, MD  naproxen (NAPROSYN) 500 MG tablet Take 1 tablet (500 mg total) by mouth 2 (two) times daily. 01/23/14   Vanetta MuldersScott Zackowski, MD  propranolol (INDERAL) 10 MG tablet Take 1 tablet (10 mg total) by mouth 3 (three) times daily. 09/24/11 09/23/12  Rodolph Bonganiel Thompson V, MD  QUEtiapine (SEROQUEL XR) 300 MG 24 hr tablet Take 1 tablet (300 mg total) by mouth daily. Please give nightly at 8pm 09/24/11 10/24/11  Rodolph Bonganiel Thompson V, MD   BP 134/75 mmHg  Pulse 74  Temp(Src) 98.1 F (36.7 C) (Oral)  Resp 18  Ht 5' 4.5" (1.638 m)  Wt 212 lb (96.163 kg)  BMI 35.84 kg/m2  SpO2 98%  LMP 12/12/2014 (Exact Date)   Physical Exam  General: Well-developed, well-nourished female in no acute distress; appearance consistent with age of record HENT: normocephalic; atraumatic Eyes: pupils equal, round and reactive to light; extraocular muscles intact Neck: supple Heart: regular rate and rhythm Lungs: clear to auscultation bilaterally Abdomen: soft; nondistended; nontender; no masses or hepatosplenomegaly; bowel sounds present GU: No CVA tenderness; mild vulvovaginal inflammation with white, curd-like vaginal discharge; no vaginal bleeding; no cervical motion tenderness Extremities: No deformity; full range of motion; pulses normal Neurologic: Awake, alert and oriented; motor function intact in all extremities and symmetric; no facial droop Skin: Warm and dry Psychiatric: Normal mood and affect    ED Course  Procedures (including critical care time)   MDM  Nursing notes and vitals signs, including pulse oximetry, reviewed.  Summary of this visit's results,  reviewed by myself:  Labs:  Results for orders placed or performed during the hospital encounter of 01/04/15 (from the past 24 hour(s))  Urinalysis, Routine w reflex microscopic (not at [facility])     Status: Abnormal   Collection Time: 01/04/15  5:30 AM  Result Value Ref Range   Color, Urine YELLOW YELLOW   APPearance CLOUDY (A)  CLEAR   Specific Gravity, Urine 1.012 1.005 - 1.030   pH 6.5 5.0 - 8.0   Glucose, UA NEGATIVE NEGATIVE mg/dL   Hgb urine dipstick NEGATIVE NEGATIVE   Bilirubin Urine NEGATIVE NEGATIVE   Ketones, ur NEGATIVE NEGATIVE mg/dL   Protein, ur NEGATIVE NEGATIVE mg/dL   Urobilinogen, UA 0.2 0.0 - 1.0 mg/dL   Nitrite NEGATIVE NEGATIVE   Leukocytes, UA MODERATE (A) NEGATIVE  Pregnancy, urine     Status: None   Collection Time: 01/04/15  5:30 AM  Result Value Ref Range   Preg Test, Ur NEGATIVE NEGATIVE  Urine microscopic-add on     Status: Abnormal   Collection Time: 01/04/15  5:30 AM  Result Value Ref Range   Squamous Epithelial / LPF MANY (A) RARE   WBC, UA 7-10 <3 WBC/hpf   RBC / HPF 0-2 <3 RBC/hpf   Bacteria, UA MANY (A) RARE   Urine-Other FEW YEAST   Wet prep, genital     Status: Abnormal   Collection Time: 01/04/15  5:35 AM  Result Value Ref Range   Yeast Wet Prep HPF POC NONE SEEN NONE SEEN   Trich, Wet Prep NONE SEEN NONE SEEN   Clue Cells Wet Prep HPF POC MODERATE (A) NONE SEEN   WBC, Wet Prep HPF POC FEW (A) NONE SEEN       Paula Libra, MD 01/04/15 [phone]

## 2015-01-05 LAB — URINE CULTURE

## 2015-02-06 ENCOUNTER — Telehealth: Payer: Self-pay | Admitting: Allergy and Immunology

## 2015-02-06 NOTE — Telephone Encounter (Signed)
Pt is in between coverage. Should have MCD soon. Since she is uninsured at the moment she is asking for a sample of Symbicort. Last OV was 07/31/14. pls advise

## 2015-02-06 NOTE — Telephone Encounter (Signed)
LM for patient to pick up sample of Symbicort 160 at front desk

## 2015-03-20 ENCOUNTER — Emergency Department (HOSPITAL_BASED_OUTPATIENT_CLINIC_OR_DEPARTMENT_OTHER)
Admission: EM | Admit: 2015-03-20 | Discharge: 2015-03-20 | Disposition: A | Discharge status: Home / Self Care | Payer: Medicaid Other | Attending: Emergency Medicine | Admitting: Emergency Medicine

## 2015-03-20 ENCOUNTER — Encounter (HOSPITAL_BASED_OUTPATIENT_CLINIC_OR_DEPARTMENT_OTHER): Payer: Self-pay | Admitting: Emergency Medicine

## 2015-03-20 DIAGNOSIS — L509 Urticaria, unspecified: Secondary | ICD-10-CM

## 2015-03-20 DIAGNOSIS — R21 Rash and other nonspecific skin eruption: Secondary | ICD-10-CM | POA: Diagnosis present

## 2015-03-20 DIAGNOSIS — Z79899 Other long term (current) drug therapy: Secondary | ICD-10-CM | POA: Diagnosis not present

## 2015-03-20 DIAGNOSIS — Z8659 Personal history of other mental and behavioral disorders: Secondary | ICD-10-CM | POA: Diagnosis not present

## 2015-03-20 DIAGNOSIS — Z87891 Personal history of nicotine dependence: Secondary | ICD-10-CM | POA: Insufficient documentation

## 2015-03-20 DIAGNOSIS — J45909 Unspecified asthma, uncomplicated: Secondary | ICD-10-CM | POA: Diagnosis not present

## 2015-03-20 MED ORDER — CETIRIZINE HCL 5 MG/5ML PO SYRP
10.0000 mg | ORAL_SOLUTION | Freq: Once | ORAL | Status: AC
  Administered 2015-03-20: 10 mg via ORAL
  Filled 2015-03-20: qty 10

## 2015-03-20 MED ORDER — METHYLPREDNISOLONE 4 MG PO TBPK: ORAL_TABLET | ORAL | Status: DC

## 2015-03-20 MED ORDER — CETIRIZINE HCL 10 MG PO TABS: ORAL_TABLET | ORAL | Status: DC

## 2015-03-20 NOTE — ED Provider Notes (Signed)
CSN: [id number]     Arrival date & time 03/20/15  2118 History  By signing my name below, I, Debra Guerrero, attest that this documentation has been prepared under the direction and in the presence of Paula Libra, MD. Electronically Signed: Budd Guerrero, ED Scribe. 03/20/2015. 11:02 PM.    Chief Complaint  Patient presents with  . Rash   The history is provided by the patient. No language interpreter was used.   HPI Comments: Debra Guerrero is a 30 y.o. female with a PMHx of asthma and seasonal allergies who presents to the Emergency Department complaining of an itchy, raised, urticarial rash to the BUE and back onset 2 weeks ago. She notes this began on her back, spreading to the right upper arm 1 week ago, and most recently to the left upper arm as of a few days ago. She notes having one bump appear on her right eyebrow, which resolved later that day. She has tried hydrocortisone cream and A&D without relief. The lesions are very pruritic, and she has scratched some of them to the point that they are scabbed over. Pt denies throat swelling, shortness of breath and n/v/d.   Past Medical History  Diagnosis Date  . Asthma   . Headache(784.0)   . Depression   . Seasonal allergies 09/11/2011  . Schizophrenia, catatonic type (HCC) 09/10/2011   History reviewed. No pertinent past surgical history. No family history on file. Social History  Substance Use Topics  . Smoking status: Former Smoker    Types: Cigarettes  . Smokeless tobacco: Never Used  . Alcohol Use: No   OB History    No data available     Review of Systems  All other systems reviewed and are negative.   Allergies  Review of patient's allergies indicates no known allergies.  Home Medications   Prior to Admission medications   Medication Sig Start Date End Date Taking? Authorizing Provider  albuterol (PROVENTIL HFA;VENTOLIN HFA) 108 (90 BASE) MCG/ACT inhaler Inhale 2 puffs into the lungs every 6 (six) hours as  needed. Shortness of breath    Historical Provider, MD  albuterol (PROVENTIL) (2.5 MG/3ML) 0.083% nebulizer solution Take 2.5 mg by nebulization every 6 (six) hours as needed.      Historical Provider, MD  cetirizine (ZYRTEC) 10 MG tablet Take 1 tablet once to twice a day as needed for hives. 03/20/15   Karelly Dewalt, MD  methylPREDNISolone (MEDROL DOSEPAK) 4 MG TBPK tablet Take tapering dose per package instructions. 03/20/15   Danija Gosa, MD   BP 131/79 mmHg  Pulse 82  Temp(Src) 98.2 F (36.8 C) (Oral)  Resp 16  Ht 5\' 5"  (1.651 m)  Wt 215 lb (97.523 kg)  BMI 35.78 kg/m2  SpO2 100%  LMP 03/05/2015 (Exact Date)   Physical Exam General: Well-developed, well-nourished female in no acute distress; appearance consistent with age of record HENT: normocephalic; atraumatic; pharynx normal Eyes: pupils equal, round and reactive to light; extraocular muscles intact Neck: supple Heart: regular rate and rhythm Lungs: clear to auscultation bilaterally Abdomen: soft; nondistended; nontender; bowel sounds present Extremities: No deformity; full range of motion; pulses normal Neurologic: Awake, alert and oriented; motor function intact in all extremities and symmetric; no facial droop Skin: Warm and dry; scattered urticarial lesions with signs of scratching Psychiatric: Normal mood and affect  ED Course  Procedures   MDM  The patient has an allergist with whom she can follow up.  Final diagnoses:  Urticaria   I personally  performed the services described in this documentation, which was scribed in my presence. The recorded information has been reviewed and is accurate.   Paula LibraJohn Zarayah Lanting, MD 03/20/15 2306

## 2015-03-20 NOTE — ED Notes (Signed)
Rash and welts x2 weeks on arms and back mostly.  Using topical OTC meds which help temporarily but it comes back.  Itching and burning.

## 2015-03-20 NOTE — Discharge Instructions (Signed)
Hives Hives are itchy, red, swollen areas of the skin. They can vary in size and location on your body. Hives can come and go for hours or several days (acute hives) or for several weeks (chronic hives). Hives do not spread from person to person (noncontagious). They may get worse with scratching, exercise, and emotional stress. CAUSES   Allergic reaction to food, additives, or drugs.  Infections, including the common cold.  Illness, such as vasculitis, lupus, or thyroid disease.  Exposure to sunlight, heat, or cold.  Exercise.  Stress.  Contact with chemicals. SYMPTOMS   Red or white swollen patches on the skin. The patches may change size, shape, and location quickly and repeatedly.  Itching.  Swelling of the hands, feet, and face. This may occur if hives develop deeper in the skin. DIAGNOSIS  Your caregiver can usually tell what is wrong by performing a physical exam. Skin or blood tests may also be done to determine the cause of your hives. In some cases, the cause cannot be determined. TREATMENT  Mild cases usually get better with medicines such as antihistamines. Severe cases may require an emergency epinephrine injection. If the cause of your hives is known, treatment includes avoiding that trigger.  HOME CARE INSTRUCTIONS   Avoid causes that trigger your hives.  Take antihistamines as directed by your caregiver to reduce the severity of your hives. Non-sedating or low-sedating antihistamines are usually recommended. Do not drive while taking an antihistamine.  Take any other medicines prescribed for itching as directed by your caregiver.  Wear loose-fitting clothing.  Keep all follow-up appointments as directed by your caregiver. SEEK MEDICAL CARE IF:   You have persistent or severe itching that is not relieved with medicine.  You have painful or swollen joints. SEEK IMMEDIATE MEDICAL CARE IF:   You have a fever.  Your tongue or lips are swollen.  You have  trouble breathing or swallowing.  You feel tightness in the throat or chest.  You have abdominal pain. These problems may be the first sign of a life-threatening allergic reaction. Call your local emergency services (911 in [place] MAKE SURE YOU:   Understand these instructions.  Will watch your condition.  Will get help right away if you are not doing well or get worse.   This information is not intended to replace advice given to you by your health care provider. Make sure you discuss any questions you have with your health care provider.   Document Released: 02/23/2005 Document Revised: 02/28/2013 Document Reviewed: 05/19/2011 Elsevier Interactive Patient Education 2016 [employer]  

## 2015-03-26 ENCOUNTER — Ambulatory Visit (INDEPENDENT_AMBULATORY_CARE_PROVIDER_SITE_OTHER): Payer: Medicaid Other | Admitting: Allergy and Immunology

## 2015-03-26 ENCOUNTER — Encounter: Payer: Self-pay | Admitting: Allergy and Immunology

## 2015-03-26 VITALS — BP 110/80 | HR 75 | Temp 98.3°F | Resp 16 | Ht 65.55 in | Wt 214.7 lb

## 2015-03-26 DIAGNOSIS — T7800XA Anaphylactic reaction due to unspecified food, initial encounter: Secondary | ICD-10-CM | POA: Insufficient documentation

## 2015-03-26 DIAGNOSIS — L501 Idiopathic urticaria: Secondary | ICD-10-CM

## 2015-03-26 DIAGNOSIS — J3089 Other allergic rhinitis: Secondary | ICD-10-CM | POA: Diagnosis not present

## 2015-03-26 DIAGNOSIS — J4540 Moderate persistent asthma, uncomplicated: Secondary | ICD-10-CM

## 2015-03-26 DIAGNOSIS — T7800XD Anaphylactic reaction due to unspecified food, subsequent encounter: Secondary | ICD-10-CM | POA: Insufficient documentation

## 2015-03-26 MED ORDER — LEVOCETIRIZINE DIHYDROCHLORIDE 5 MG PO TABS: 5.0000 mg | ORAL_TABLET | Freq: Every evening | ORAL | Status: DC

## 2015-03-26 MED ORDER — MONTELUKAST SODIUM 10 MG PO TABS: 10.0000 mg | ORAL_TABLET | Freq: Every day | ORAL | Status: DC

## 2015-03-26 NOTE — Assessment & Plan Note (Signed)
Urticaria. Often times the onset of urtica is secondary to a viral infection. Once the mast cell membranes have been destabilized, it is not unusual for recurrent episodes of histamine release to occur in the ensuing weeks or months.  Skin tests to select food allergens were negative today. NSAIDs and emotional stress commonly exacerbate urticaria but are not the underlying etiology in this case. Physical urticarias are negative by history (i.e. pressure-induced, temperature, vibration, solar, etc.). History and lesions are not consistent with urticaria pigmentosa so I am not suspicious for mastocytosis. There are no concomitant symptoms concerning for anaphylaxis or constitutional symptoms worrisome for an underlying malignancy. We will not order labs at this time, however, if lesions recur, persist, progress, or change in character, we will assess other potential etiologies with screening labs.  For symptom relief, the patient is to take oral antihistamines as directed.  Instructions have been provided and discussed for H1/H2 receptor blockade with step-wise increase/decrease to find lowest effective dose.  For significant symptoms, a journal is to be kept recording any foods eaten, beverages consumed, medications taken within a 6 hour period prior to the onset of symptoms, as well as record activities being performed, and environmental conditions. For any symptoms concerning for anaphylaxis, 911 is to be called immediately.

## 2015-03-26 NOTE — Patient Instructions (Addendum)
Urticaria Urticaria. Often times the onset of urtica is secondary to a viral infection. Once the mast cell membranes have been destabilized, it is not unusual for recurrent episodes of histamine release to occur in the ensuing weeks or months.  Skin tests to select food allergens were negative today. NSAIDs and emotional stress commonly exacerbate urticaria but are not the underlying etiology in this case. Physical urticarias are negative by history (i.e. pressure-induced, temperature, vibration, solar, etc.). History and lesions are not consistent with urticaria pigmentosa so I am not suspicious for mastocytosis. There are no concomitant symptoms concerning for anaphylaxis or constitutional symptoms worrisome for an underlying malignancy. We will not order labs at this time, however, if lesions recur, persist, progress, or change in character, we will assess other potential etiologies with screening labs.  For symptom relief, the patient is to take oral antihistamines as directed.  Instructions have been provided and discussed for H1/H2 receptor blockade with step-wise increase/decrease to find lowest effective dose.  For significant symptoms, a journal is to be kept recording any foods eaten, beverages consumed, medications taken within a 6 hour period prior to the onset of symptoms, as well as record activities being performed, and environmental conditions. For any symptoms concerning for anaphylaxis, 911 is to be called immediately.  Seasonal and perennial allergic rhinitis  Aeroallergen avoidance measures have been discussed and provided in written form.  A prescription has been provided for levocetirizine, 5 mg daily as needed.  A prescription has been provided for fluticasone nasal spray, 2 sprays per nostril daily as needed. Proper nasal spray technique has been discussed and demonstrated.  If allergen avoidance measures and medications fail to adequately relieve symptoms, we will consider  immunotherapy.  Moderate persistent asthma Today's spirometry results, assessed while asymptomatic, suggest under-perception of dyspnea.  A prescription has been provided for montelukast 10 mg daily at bedtime.  For now, continue Symbicort 160/4.5, 2 inhalations via spacer device twice a day, and albuterol HFA every 4-6 hours as needed.  Subjective and objective measures of pulmonary function will be followed and the treatment plan will be adjusted accordingly.  Allergy with anaphylaxis due to food  Continue meticulous avoidance of shellfish and have access to epinephrine autoinjector 2 pack in case of accidental ingestion.   Return in about 6 weeks (around 05/07/2015), or if symptoms worsen or fail to improve.  Urticaria (Hives)  . Levocetirizine (Xyzal) 5 mg in morning and Cetirizine (Zyrtec)  at night and ranitidine (Zantac) 150 mg twice a day. If no symptoms for 7-14 days then decrease to. . Levocetirizine (Xyzal) 5 mg in morning and Cetirizine (Zyrtec)  at night and ranitidine (Zantac) 150 mg once a day.  If no symptoms for 7-14 days then decrease to. . Levocetirizine (Xyzal) 5 mg in morning and Cetirizine (Zyrtec)  at night.  If no symptoms for 7-14 days then decrease to. . Levocetirizine (Xyzal) 5 mg once a day.  May use Benadryl (diphenhydramine) as needed for breakthrough symptoms       If symptoms return, then step up dosage  If levocetirizine is too expensive, do the following: Hives (urticaria)  . Cetirizine (Zyrtec)  twice a day and ranitidine (Zantac) 150 mg twice a day. If no symptoms for 7-14 days then decrease to. . Cetirizine (Zyrtec)  twice a day and ranitidine (Zantac) 150 mg once a day.  If no symptoms for 7-14 days then decrease to. . Cetirizine (Zyrtec)  twice a day.  If no symptoms for 7-14 days then  decrease to. . Cetirizine (Zyrtec)  once a day.  May use Benadryl (diphenhydramine) as needed for breakthrough hives       If  symptoms return, then step up dosage  Reducing Pollen Exposure  The American Academy of Allergy, Asthma and Immunology suggests the following steps to reduce your exposure to pollen during allergy seasons.    1. Do not hang sheets or clothing out to dry; pollen may collect on these items. 2. Do not mow lawns or spend time around freshly cut grass; mowing stirs up pollen. 3. Keep windows closed at night.  Keep car windows closed while driving. 4. Minimize morning activities outdoors, a time when pollen counts are usually at their highest. 5. Stay indoors as much as possible when pollen counts or humidity is high and on windy days when pollen tends to remain in the air longer. 6. Use air conditioning when possible.  Many air conditioners have filters that trap the pollen spores. 7. Use a HEPA room air filter to remove pollen form the indoor air you breathe.   Control of House Dust Mite Allergen  House dust mites play a major role in allergic asthma and rhinitis.  They occur in environments with high humidity wherever human skin, the food for dust mites is found. High levels have been detected in dust obtained from mattresses, pillows, carpets, upholstered furniture, bed covers, clothes and soft toys.  The principal allergen of the house dust mite is found in its feces.  A gram of dust may contain 1,000 mites and 250,000 fecal particles.  Mite antigen is easily measured in the air during house cleaning activities.    1. Encase mattresses, including the box spring, and pillow, in an air tight cover.  Seal the zipper end of the encased mattresses with wide adhesive tape. 2. Wash the bedding in water of 130 degrees Farenheit weekly.  Avoid cotton comforters/quilts and flannel bedding: the most ideal bed covering is the dacron comforter. 3. Remove all upholstered furniture from the bedroom. 4. Remove carpets, carpet padding, rugs, and non-washable window drapes from the bedroom.  Wash drapes weekly  or use plastic window coverings. 5. Remove all non-washable stuffed toys from the bedroom.  Wash stuffed toys weekly. 6. Have the room cleaned frequently with a vacuum cleaner and a damp dust-mop.  The patient should not be in a room which is being cleaned and should wait 1 hour after cleaning before going into the room. 7. Close and seal all heating outlets in the bedroom.  Otherwise, the room will become filled with dust-laden air.  An electric heater can be used to heat the room. Reduce indoor humidity to less than 50%.  Do not use a humidifier.  Control of Dog or Cat Allergen  Avoidance is the best way to manage a dog or cat allergy. If you have a dog or cat and are allergic to dog or cats, consider removing the dog or cat from the home. If you have a dog or cat but don't want to find it a new home, or if your family wants a pet even though someone in the household is allergic, here are some strategies that may help keep symptoms at bay:  1. Keep the pet out of your bedroom and restrict it to only a few rooms. Be advised that keeping the dog or cat in only one room will not limit the allergens to that room. 2. Don't pet, hug or kiss the dog or cat; if you  do, wash your hands with soap and water. 3. High-efficiency particulate air (HEPA) cleaners run continuously in a bedroom or living room can reduce allergen levels over time. 4. Regular use of a high-efficiency vacuum cleaner or a central vacuum can reduce allergen levels. 5. Giving your dog or cat a bath at least once a week can reduce airborne allergen.  Control of Mold Allergen  Mold and fungi can grow on a variety of surfaces provided certain temperature and moisture conditions exist.  Outdoor molds grow on plants, decaying vegetation and soil.  The major outdoor mold, Alternaria and Cladosporium, are found in very high numbers during hot and dry conditions.  Generally, a late Summer - Fall peak is seen for common outdoor fungal spores.   Rain will temporarily lower outdoor mold spore count, but counts rise rapidly when the rainy period ends.  The most important indoor molds are Aspergillus and Penicillium.  Dark, humid and poorly ventilated basements are ideal sites for mold growth.  The next most common sites of mold growth are the bathroom and the kitchen.  Outdoor [employer] 2. Use air conditioning and keep windows closed 3. Avoid exposure to decaying vegetation. 4. Avoid leaf raking. 5. Avoid grain handling. 6. Consider wearing a face mask if working in moldy areas.  Indoor Mold Control 1. Maintain humidity below 50%. 2. Clean washable surfaces with 5% bleach solution. 3. Remove sources e.g. Contaminated carpets.  Control of Cockroach Allergen  Cockroach allergen has been identified as an important cause of acute attacks of asthma, especially in urban settings.  There are fifty-five species of cockroach that exist in the [place], however only three, the [place], [place] species produce allergen that can affect patients with Asthma.  Allergens can be obtained from fecal particles, egg casings and secretions from cockroaches.    1. Remove food sources. 2. Reduce access to water. 3. Seal access and entry points. 4. Spray runways with 0.5-1% Diazinon or Chlorpyrifos 5. Blow boric acid power under stoves and refrigerator. 6. Place bait stations (hydramethylnon) at feeding sites.

## 2015-03-26 NOTE — Assessment & Plan Note (Addendum)
Today's spirometry results, assessed while asymptomatic, suggest under-perception of dyspnea.  A prescription has been provided for montelukast 10 mg daily at bedtime.  For now, continue Symbicort 160/4.5, 2 inhalations via spacer device twice a day, and albuterol HFA every 4-6 hours as needed.  Subjective and objective measures of pulmonary function will be followed and the treatment plan will be adjusted accordingly.

## 2015-03-26 NOTE — Assessment & Plan Note (Signed)
   Aeroallergen avoidance measures have been discussed and provided in written form.  A prescription has been provided for levocetirizine, 5 mg daily as needed.  A prescription has been provided for fluticasone nasal spray, 2 sprays per nostril daily as needed. Proper nasal spray technique has been discussed and demonstrated.  If allergen avoidance measures and medications fail to adequately relieve symptoms, we will consider immunotherapy.

## 2015-03-26 NOTE — Assessment & Plan Note (Signed)
   Continue meticulous avoidance of shellfish and have access to epinephrine autoinjector 2 pack in case of accidental ingestion. 

## 2015-03-26 NOTE — Progress Notes (Signed)
Follow-up Note  RE: Debra Guerrero MRN: [id number] DOB: 01-09-1986 Date of Office Visit: 03/26/2015  Primary care provider: Willow Ora, MD Referring provider: Willow Ora, MD  History of present illness: HPI Comments: Debra Guerrero is a 30 y.o. female with asthma, allergic rhinitis, and food allergies who presents today for evaluation of a new problem.  Over the past 3 weeks, Debra Guerrero has experienced recurrent episodes of hives. Typical distribution includes the neck, chest, back, arms and legs.  The lesions are described as erythematous, raised, and pruritic.  Individual hives last less than 24 hours without leaving residual pigmentation or bruising.  She does not experience concomitant cardiopulmonary or GI symptoms.  She has not experienced unexpected weight loss, recurrent fevers or drenching night sweats. No specific medication, food or environmental triggers have been identified. The symptoms do not seem to correlate with NSAIDs use. Symptoms do not seem to correlate with emotional stress.  Debra Guerrero had signs and symptoms of a respiratory tract infection at the time of symptom onset.  She has tried to control symptoms with the following medications: Over-the-counter antihistamines and topical corticosteroids. These medications have offered adequate temporary relief of symptoms. The patient has been evaluated and treated in the emergency department for these symptoms. Skin biopsy has not been performed. She has a documented shellfish allergy, however denies consumption of shellfish. Debra Guerrero reports that her asthma has been relatively well-controlled while taking Symbicort 160/4.5, 2 inhalations via spacer device twice a day.  She reports that she requires albuterol rescue approximately one time per week and denies nocturnal awakenings due to lower respiratory symptoms.  With the exception of occasional frontal sinus pressure, she denies nasal or sinus symptom complaints today.   Assessment  and plan: Urticaria Urticaria. Often times the onset of urtica is secondary to a viral infection. Once the mast cell membranes have been destabilized, it is not unusual for recurrent episodes of histamine release to occur in the ensuing weeks or months.  Skin tests to select food allergens were negative today. NSAIDs and emotional stress commonly exacerbate urticaria but are not the underlying etiology in this case. Physical urticarias are negative by history (i.e. pressure-induced, temperature, vibration, solar, etc.). History and lesions are not consistent with urticaria pigmentosa so I am not suspicious for mastocytosis. There are no concomitant symptoms concerning for anaphylaxis or constitutional symptoms worrisome for an underlying malignancy. We will not order labs at this time, however, if lesions recur, persist, progress, or change in character, we will assess other potential etiologies with screening labs.  For symptom relief, the patient is to take oral antihistamines as directed.  Instructions have been provided and discussed for H1/H2 receptor blockade with step-wise increase/decrease to find lowest effective dose.  For significant symptoms, a journal is to be kept recording any foods eaten, beverages consumed, medications taken within a 6 hour period prior to the onset of symptoms, as well as record activities being performed, and environmental conditions. For any symptoms concerning for anaphylaxis, 911 is to be called immediately.  Seasonal and perennial allergic rhinitis  Aeroallergen avoidance measures have been discussed and provided in written form.  A prescription has been provided for levocetirizine, 5 mg daily as needed.  A prescription has been provided for fluticasone nasal spray, 2 sprays per nostril daily as needed. Proper nasal spray technique has been discussed and demonstrated.  If allergen avoidance measures and medications fail to adequately relieve symptoms, we will  consider immunotherapy.  Moderate persistent asthma Today's spirometry  results, assessed while asymptomatic, suggest under-perception of dyspnea.  A prescription has been provided for montelukast 10 mg daily at bedtime.  For now, continue Symbicort 160/4.5, 2 inhalations via spacer device twice a day, and albuterol HFA every 4-6 hours as needed.  Subjective and objective measures of pulmonary function will be followed and the treatment plan will be adjusted accordingly.  Allergy with anaphylaxis due to food  Continue meticulous avoidance of shellfish and have access to epinephrine autoinjector 2 pack in case of accidental ingestion.    Meds ordered this encounter  Medications  . montelukast (SINGULAIR) 10 MG tablet    Sig: Take 1 tablet (10 mg total) by mouth at bedtime.    Dispense:  30 tablet    Refill:  5  . levocetirizine (XYZAL) 5 MG tablet    Sig: Take 1 tablet (5 mg total) by mouth every evening.    Dispense:  30 tablet    Refill:  5    Diagnositics: Aeroallergen skin testing: Positive to grass pollen, weed pollen, ragweed pollen, tree pollen, mold, cat hair, dog epithelia, dust mite, cockroach antigen. Food allergen skin testing: Positive to shellfish mix and shrimp. Spirometry: FVC was 2.75 L (89% predicted) and FEV1 was 1.89 L (72% predicted) with significant (400 mL, 21%) post bronchodilator improvement.    Physical examination: Blood pressure 110/80, pulse 75, temperature 98.3 F (36.8 C), temperature source Oral, resp. rate 16, height 5' 5.55" (1.665 m), weight 214 lb 11.7 oz (97.4 kg), last menstrual period 03/05/2015.  General: Alert, interactive, in no acute distress. HEENT: TMs pearly gray, turbinates edematous without discharge, post-pharynx mildly erythematous. Neck: Supple without lymphadenopathy. Lungs: Clear to auscultation without wheezing, rhonchi or rales. CV: Normal S1, S2 without murmurs. Skin: Warm and dry, without lesions or rashes.  The  following portions of the patient's history were reviewed and updated as appropriate: allergies, current medications, past family history, past medical history, past social history, past surgical history and problem list.    Medication List       This list is accurate as of: 03/26/15  6:43 PM.  Always use your most recent med list.               albuterol 108 (90 Base) MCG/ACT inhaler  Commonly known as:  PROVENTIL HFA;VENTOLIN HFA  Inhale 2 puffs into the lungs every 6 (six) hours as needed. Shortness of breath     budesonide-formoterol 160-4.5 MCG/ACT inhaler  Commonly known as:  SYMBICORT  Inhale 2 puffs into the lungs 2 (two) times daily.     cetirizine 10 MG tablet  Commonly known as:  ZYRTEC  Take 1 tablet once to twice a day as needed for hives.     levocetirizine 5 MG tablet  Commonly known as:  XYZAL  Take 1 tablet (5 mg total) by mouth every evening.     methylPREDNISolone 4 MG Tbpk tablet  Commonly known as:  MEDROL DOSEPAK  Take tapering dose per package instructions.     montelukast 10 MG tablet  Commonly known as:  SINGULAIR  Take 1 tablet (10 mg total) by mouth at bedtime.        No Known Allergies  Review of systems: Constitutional: Negative for fever, chills and weight loss.  HENT: Negative for nosebleeds.   Eyes: Negative for blurred vision.  Respiratory: Negative for hemoptysis.  Positive for wheezing. Cardiovascular: Negative for chest pain.  Gastrointestinal: Negative for diarrhea and constipation.  Genitourinary: Negative for dysuria.  Musculoskeletal: Negative  for myalgias and joint pain.  Neurological: Negative for dizziness.  Endo/Heme/Allergies: Does not bruise/bleed easily.  Cutaneous: Positive for hives.  Past Medical History  Diagnosis Date  . Asthma   . Headache(784.0)   . Depression   . Seasonal allergies 09/11/2011  . 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 Asthma  Mother   . Asthma Father   . Eczema Sister   . Allergic rhinitis Neg Hx   . Angioedema Neg Hx   . Atopy Neg Hx   . Immunodeficiency Neg Hx   . Urticaria Neg Hx     Social History   Social History  . Marital Status: Married    Spouse Name: N/A  . Number of Children: N/A  . Years of Education: N/A   Occupational History  . Not on file.   Social History Main Topics  . Smoking status: Former Smoker    Types: Cigarettes  . Smokeless tobacco: Never Used  . Alcohol Use: No  . Drug Use: No  . Sexual Activity: Yes    Birth Control/ Protection: None   Other Topics Concern  . Not on file   Social History Narrative    I appreciate the opportunity to take part in this Debra Guerrero's care. Please do not hesitate to contact me with questions.  Sincerely,   R. Jorene Guest, MD

## 2015-03-28 ENCOUNTER — Ambulatory Visit: Payer: Self-pay | Admitting: Allergy and Immunology

## 2015-03-29 ENCOUNTER — Telehealth: Payer: Self-pay

## 2015-03-29 MED ORDER — ALBUTEROL SULFATE HFA 108 (90 BASE) MCG/ACT IN AERS: 1.0000 | INHALATION_SPRAY | RESPIRATORY_TRACT | Status: DC | PRN

## 2015-03-29 MED ORDER — BUDESONIDE-FORMOTEROL FUMARATE 160-4.5 MCG/ACT IN AERO: 2.0000 | INHALATION_SPRAY | Freq: Two times a day (BID) | RESPIRATORY_TRACT | Status: DC

## 2015-03-29 NOTE — Telephone Encounter (Signed)
Spoke with patient advised to get pharmacy to fax [place] a prior authorization due to failure of claritin and zyrtec. Aware it could be until Monday before PA is done patient aware wants to know if she can use something else in place of that since she has hives advised she can replace it with zyrtec until pa approved. Patient aware also states rescue inhaler was not sent in to pharmacy writer sent in Symbicort 160 (only received sample) as written on patient's instructions and Proair HFA.

## 2015-03-29 NOTE — Telephone Encounter (Signed)
  Patient was seen on 03/26/2015 and we sent in levocetirizine 5 MG tablet. She just received MCD on today and the pharmacy is stating MCD will not cover levoceterizine.   [facility]   Please Advise

## 2015-04-02 ENCOUNTER — Other Ambulatory Visit: Payer: Self-pay

## 2015-04-02 DIAGNOSIS — L501 Idiopathic urticaria: Secondary | ICD-10-CM

## 2015-04-02 DIAGNOSIS — J3089 Other allergic rhinitis: Secondary | ICD-10-CM

## 2015-04-02 MED ORDER — LEVOCETIRIZINE DIHYDROCHLORIDE 5 MG PO TABS: 5.0000 mg | ORAL_TABLET | Freq: Every evening | ORAL | Status: DC

## 2015-04-15 ENCOUNTER — Ambulatory Visit (INDEPENDENT_AMBULATORY_CARE_PROVIDER_SITE_OTHER): Payer: Medicaid Other | Admitting: Allergy and Immunology

## 2015-04-15 ENCOUNTER — Encounter: Payer: Self-pay | Admitting: Allergy and Immunology

## 2015-04-15 VITALS — BP 110/80 | HR 78 | Temp 98.0°F | Resp 17

## 2015-04-15 DIAGNOSIS — J4540 Moderate persistent asthma, uncomplicated: Secondary | ICD-10-CM | POA: Diagnosis not present

## 2015-04-15 DIAGNOSIS — K2970 Gastritis, unspecified, without bleeding: Secondary | ICD-10-CM | POA: Diagnosis not present

## 2015-04-15 DIAGNOSIS — L501 Idiopathic urticaria: Secondary | ICD-10-CM | POA: Diagnosis not present

## 2015-04-15 DIAGNOSIS — T7800XD Anaphylactic reaction due to unspecified food, subsequent encounter: Secondary | ICD-10-CM

## 2015-04-15 MED ORDER — RANITIDINE HCL 150 MG PO TABS: 150.0000 mg | ORAL_TABLET | Freq: Two times a day (BID) | ORAL | Status: DC

## 2015-04-15 MED ORDER — CETIRIZINE HCL 10 MG PO TABS: ORAL_TABLET | ORAL | Status: DC

## 2015-04-15 MED ORDER — DOXEPIN HCL 25 MG PO CAPS: 25.0000 mg | ORAL_CAPSULE | Freq: Every day | ORAL | Status: DC

## 2015-04-15 NOTE — Assessment & Plan Note (Signed)
   Continue meticulous avoidance of shellfish and have access to epinephrine autoinjector 2 pack in case of accidental ingestion. 

## 2015-04-15 NOTE — Patient Instructions (Addendum)
Urticaria  The following labs have been ordered: FCeRI antibody, TSH, anti-thyroglobulin antibody, thyroid peroxidase antibody, tryptase, urea breath test, CBC, CMP, ESR, ANA, and galactose-alpha-1,3-galactose IgE level.  The patient will be called with further recommendations after lab results have returned.  Prednisone has been provided, 40 mg x3 days, 20 mg x1 day, 10 mg x1 day, then stop.  The patient has been instructed to have her blood drawn prior to starting prednisone.  A prescription has been provided for doxepin 25 mg at bedtime as needed.  Instructions have been provided and discussed for H1/H2 receptor blockade with titration to find lowest effective dose.  A journal is to be kept recording any foods eaten, beverages consumed, medications taken within a 6 hour period prior to the onset of symptoms, as well as record activities being performed, and environmental conditions. For any symptoms concerning for anaphylaxis, 911 is to be called immediately.  Moderate persistent asthma  For now, continue Symbicort 160/4.5 g, 2 inhalations via spacer device twice a day, montelukast 10 mg daily bedtime, and albuterol HFA every 4-6 hours as needed.  Subjective and objective measures of pulmonary function will be followed and the treatment plan will be adjusted accordingly.  Allergy with anaphylaxis due to food  Continue meticulous avoidance of shellfish and have access to epinephrine autoinjector 2 pack in case of accidental ingestion.    Return in about 8 weeks (around 06/10/2015), or if symptoms worsen or fail to improve.  Urticaria (Hives)  . Levocetirizine (Xyzal) 5 mg in morning and Cetirizine (Zyrtec) 46m at night and ranitidine (Zantac) 150 mg twice a day. If no symptoms for 7-14 days then decrease to. . Levocetirizine (Xyzal) 5 mg in morning and Cetirizine (Zyrtec) 156mat night and ranitidine (Zantac) 150 mg once a day.  If no symptoms for 7-14 days then decrease  to. . Levocetirizine (Xyzal) 5 mg in morning and Cetirizine (Zyrtec) 1031mt night.  If no symptoms for 7-14 days then decrease to. . Levocetirizine (Xyzal) 5 mg once a day.  Take doxepin 58m14m bedtime. May use Benadryl (diphenhydramine) as needed for breakthrough symptoms       If symptoms return, then step up dosage

## 2015-04-15 NOTE — Assessment & Plan Note (Addendum)
   The following labs have been ordered: FCeRI antibody, TSH, anti-thyroglobulin antibody, thyroid peroxidase antibody, tryptase, urea breath test, CBC, CMP, ESR, ANA, and galactose-alpha-1,3-galactose IgE level.  The patient will be called with further recommendations after lab results have returned.  Prednisone has been provided, 40 mg x3 days, 20 mg x1 day, 10 mg x1 day, then stop.  The patient has been instructed to have her blood drawn prior to starting prednisone.  A prescription has been provided for doxepin 25 mg at bedtime as needed.  Instructions have been provided and discussed for H1/H2 receptor blockade with titration to find lowest effective dose.  A journal is to be kept recording any foods eaten, beverages consumed, medications taken within a 6 hour period prior to the onset of symptoms, as well as record activities being performed, and environmental conditions. For any symptoms concerning for anaphylaxis, 911 is to be called immediately.

## 2015-04-15 NOTE — Assessment & Plan Note (Signed)
   For now, continue Symbicort 160/4.5 g, 2 inhalations via spacer device twice a day, montelukast 10 mg daily bedtime, and albuterol HFA every 4-6 hours as needed.  Subjective and objective measures of pulmonary function will be followed and the treatment plan will be adjusted accordingly.

## 2015-04-15 NOTE — Progress Notes (Signed)
Follow-up Note  RE: Debra Guerrero MRN: [id number] DOB: [birth year] Date of Office Visit: 04/15/2015  Primary care provider: Leamon Arnt, MD Referring provider: Leamon Arnt, MD  History of present illness: HPI Comments: Debra Guerrero is a 30 y.o. female with urticaria, persistent asthma, and food allergy who presents today for sick visit.  She reports that she has been experiencing generalized hives on a daily basis.  She had not been taking medications as intended because she misunderstood the directions for the H1/H2 receptor blockade titration schedule.  She does not experience concomitant cardiopulmonary or GI symptoms.  Her asthma has been well controlled with Symbicort 160/4.5 g, 2 inhalations via spacer device twice a day, and montelukast 10 mg daily.  She avoids shellfish and has access to EpiPen 2 pack.    Assessment and plan: Urticaria  The following labs have been ordered: FCeRI antibody, TSH, anti-thyroglobulin antibody, thyroid peroxidase antibody, tryptase, urea breath test, CBC, CMP, ESR, ANA, and galactose-alpha-1,3-galactose IgE level.  The patient will be called with further recommendations after lab results have returned.  Prednisone has been provided, 40 mg x3 days, 20 mg x1 day, 10 mg x1 day, then stop.  The patient has been instructed to have her blood drawn prior to starting prednisone.  A prescription has been provided for doxepin 25 mg at bedtime as needed.  Instructions have been provided and discussed for H1/H2 receptor blockade with titration to find lowest effective dose.  A journal is to be kept recording any foods eaten, beverages consumed, medications taken within a 6 hour period prior to the onset of symptoms, as well as record activities being performed, and environmental conditions. For any symptoms concerning for anaphylaxis, 911 is to be called immediately.  Moderate persistent asthma  For now, continue Symbicort 160/4.5 g, 2  inhalations via spacer device twice a day, montelukast 10 mg daily bedtime, and albuterol HFA every 4-6 hours as needed.  Subjective and objective measures of pulmonary function will be followed and the treatment plan will be adjusted accordingly.  Allergy with anaphylaxis due to food  Continue meticulous avoidance of shellfish and have access to epinephrine autoinjector 2 pack in case of accidental ingestion.    Meds ordered this encounter  Medications  . doxepin (SINEQUAN) 25 MG capsule    Sig: Take 1 capsule (25 mg total) by mouth at bedtime.    Dispense:  30 capsule    Refill:  3  . cetirizine (ZYRTEC) 10 MG tablet    Sig: Take 1 tablet once to twice a day as needed for hives.    Dispense:  30 tablet    Refill:  3  . ranitidine (ZANTAC) 150 MG tablet    Sig: Take 1 tablet (150 mg total) by mouth 2 (two) times daily.    Dispense:  30 tablet    Refill:  3    Diagnositics: Spirometry:  Normal with an FEV1 of 82% predicted.  Please see scanned spirometry results for details.    Physical examination: Blood pressure 110/80, pulse 78, temperature 98 F (36.7 C), temperature source Oral, resp. rate 17, last menstrual period 03/05/2015.  General: Alert, interactive, in no acute distress. HEENT: TMs pearly gray, turbinates mildly edematous without discharge, post-pharynx mildly erythematous. Neck: Supple without lymphadenopathy. Lungs: Clear to auscultation without wheezing, rhonchi or rales. CV: Normal S1, S2 without murmurs. Skin: Scattered erythematous urticarial type lesions primarily located forearms bilaterally , nonvesicular.  The following portions of the patient's history were  reviewed and updated as appropriate: allergies, current medications, past family history, past medical history, past social history, past surgical history and problem list.    Medication List       This list is accurate as of: 04/15/15  6:02 PM.  Always use your most recent med list.                 albuterol 108 (90 Base) MCG/ACT inhaler  Commonly known as:  PROAIR HFA  Inhale 1-2 puffs into the lungs every 4 (four) hours as needed for wheezing or shortness of breath.     budesonide-formoterol 160-4.5 MCG/ACT inhaler  Commonly known as:  SYMBICORT  Inhale 2 puffs into the lungs 2 (two) times daily.     cetirizine 10 MG tablet  Commonly known as:  ZYRTEC  Take 1 tablet once to twice a day as needed for hives.     doxepin 25 MG capsule  Commonly known as:  SINEQUAN  Take 1 capsule (25 mg total) by mouth at bedtime.     levocetirizine 5 MG tablet  Commonly known as:  XYZAL  Take 1 tablet (5 mg total) by mouth every evening.     methylPREDNISolone 4 MG Tbpk tablet  Commonly known as:  MEDROL DOSEPAK  Take tapering dose per package instructions.     montelukast 10 MG tablet  Commonly known as:  SINGULAIR  Take 1 tablet (10 mg total) by mouth at bedtime.     ranitidine 150 MG tablet  Commonly known as:  ZANTAC  Take 1 tablet (150 mg total) by mouth 2 (two) times daily.        Allergies  Allergen Reactions  . Shellfish Allergy Hives and Shortness Of Breath   Review of systems: Constitutional: Negative for fever, chills and weight loss.  Cutaneous: Positive for pruritus, hives. HENT: Negative for nosebleeds.   Eyes: Negative for blurred vision.  Respiratory: Negative for hemoptysis.   Cardiovascular: Negative for chest pain.  Gastrointestinal: Negative for diarrhea and constipation.  Genitourinary: Negative for dysuria.  Musculoskeletal: Negative for myalgias and joint pain.  Neurological: Negative for dizziness.  Endo/Heme/Allergies: Does not bruise/bleed easily.   Past Medical History  Diagnosis Date  . Asthma   . Headache(784.0)   . Depression   . Seasonal allergies 09/11/2011  . 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 Asthma Mother   . Asthma Father   . Eczema Sister   . Allergic  rhinitis Neg Hx   . Angioedema Neg Hx   . Atopy Neg Hx   . Immunodeficiency Neg Hx   . Urticaria Neg Hx     Social History   Social History  . Marital Status: Married    Spouse Name: N/A  . Number of Children: N/A  . Years of Education: N/A   Occupational History  . Not on file.   Social History Main Topics  . Smoking status: Former Smoker    Types: Cigarettes  . Smokeless tobacco: Never Used  . Alcohol Use: No  . Drug Use: No  . Sexual Activity: Yes    Birth Control/ Protection: None   Other Topics Concern  . Not on file   Social History Narrative    I appreciate the opportunity to take part in this Skyeler's care. Please do not hesitate to contact me with questions.  Sincerely,   R. Edgar Frisk, MD

## 2015-04-16 LAB — COMPREHENSIVE METABOLIC PANEL
ALT: 14 U/L (ref 6–29)
AST: 15 U/L (ref 10–30)
Albumin: 4.1 g/dL (ref 3.6–5.1)
Alkaline Phosphatase: 80 U/L (ref 33–115)
BUN: 10 mg/dL (ref 7–25)
CO2: 25 mmol/L (ref 20–31)
Calcium: 9.9 mg/dL (ref 8.6–10.2)
Chloride: 100 mmol/L (ref 98–110)
Creat: 0.56 mg/dL (ref 0.50–1.10)
Glucose, Bld: 70 mg/dL (ref 65–99)
Potassium: 4.3 mmol/L (ref 3.5–5.3)
Sodium: 139 mmol/L (ref 135–146)
Total Bilirubin: 0.3 mg/dL (ref 0.2–1.2)
Total Protein: 7.2 g/dL (ref 6.1–8.1)

## 2015-04-16 LAB — THYROGLOBULIN LEVEL: Thyroglobulin: 14.8 ng/mL (ref 2.8–40.9)

## 2015-04-16 LAB — H. PYLORI BREATH TEST: H. pylori Breath Test: NOT DETECTED

## 2015-04-16 LAB — TRYPTASE: Tryptase: 4.1 ug/L (ref <11)

## 2015-04-16 LAB — ANA: Anti Nuclear Antibody(ANA): NEGATIVE

## 2015-04-17 LAB — GALACTOSE-ALPHA-1,3-GALACTOSE IGE: Galactose-alpha-1,3-galactose IgE: 0.23 kU/L (ref <0.35)

## 2015-04-24 LAB — CP CHRONIC URTICARIA INDEX PANEL
Histamine Release: 28 % — ABNORMAL HIGH (ref <16)
TSH: 0.8 mIU/L
Thyroglobulin Ab: <1 IU/mL (ref <2)
Thyroperoxidase Ab SerPl-aCnc: 8 IU/mL (ref <9)

## 2015-05-01 ENCOUNTER — Telehealth: Payer: Self-pay

## 2015-05-01 NOTE — Telephone Encounter (Signed)
Dr.Bobbitt please advise I sent you a message about patient's hives being the same not getting better written in result note and routed back to you. Thank you.

## 2015-05-01 NOTE — Telephone Encounter (Signed)
Patient talked to a nurse on Monday, she is wondering when she will receive a call back from a nurse or Dr. About about her hives/lab results.  Please Advise  Thanks

## 2015-05-01 NOTE — Telephone Encounter (Signed)
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  Continue H1/H2 receptor blockade.  If symptoms persist or progress despite the highest level of H1/H2 receptor blockade, we will provide a prescription for doxepin 25 mg daily at bedtime.

## 2015-05-02 NOTE — Telephone Encounter (Signed)
Spoke with patient who is already taking doxepin 25 mg and it has not helped. Patient is asking for something else to help with just the hives. Advised patient that Dr Nunzio Cobbs is out of the office today and will not be back until Monday. Patient does understand that he will not be back in the office until Monday.

## 2015-05-06 MED ORDER — PREDNISONE 10 MG PO TABS: ORAL_TABLET | ORAL | Status: DC

## 2015-05-06 NOTE — Telephone Encounter (Signed)
Spoke with patient and notified that medication was at her pharmacy and that she needs to look over the xolair information to see if that is something she would be interested in getting. Patient will review information and call us back on if she wants to start it.

## 2015-05-06 NOTE — Telephone Encounter (Signed)
Please provide the patient with information regarding Xolair therapy for urticaria.  Xolair is typically prescribed for idiopathic urticaria rather than autoimmune urticaria, however his histamine release levels were not markedly elevated therefore this therapy may be of benefit to him.  Please discuss scheduling for him to come in for a sample injection of Xolair.  In addition, please prescribe montelukast 10 mg daily and prednisone 20 mg x 4 days, 10 mg x1 day. Thanks.

## 2015-05-06 NOTE — Telephone Encounter (Addendum)
Called and left voicemail for patient to return phone call. Prednisone sent to patients pharmacy and xolair informations sent out to patient.

## 2015-05-09 ENCOUNTER — Telehealth: Payer: Self-pay

## 2015-05-09 NOTE — Telephone Encounter (Signed)
Called and spoke to patient who is considering the Xolair but wants to know how long it would last and is concerned about possible risk factors such as anaphylactic reaction. Patient would like to speak with Dr Nunzio Cobbs more about this before agreeing to this. Patient is asking more about what is the cause behind her hives and also if there is any other way to find out what is causing them. She is taking all the medications that Dr Nunzio Cobbs gave her and is wanting to know when the hives will go away. Patient did say that if we cant seem to help her more then she will see another doctor. Did advised patient that Dr Nunzio Cobbs wouldn't be back in the office until Monday and patient is asking if on Monday Dr Nunzio Cobbs could contact her in his spare time.

## 2015-05-09 NOTE — Telephone Encounter (Signed)
Patient would like to talk a nurse about the meds we sent in the mail for her hives.   Patient sees Dr. Nunzio Cobbs.   Thanks

## 2015-05-13 NOTE — Telephone Encounter (Signed)
I spoke with Debra Guerrero and answered her questions. She is interested in trying a sample of Xolair for urticaria. Please call her set her up for sample Xolair injection.  Thanks.

## 2015-05-13 NOTE — Telephone Encounter (Signed)
I spoke to Debra Guerrero.  She will come in Thursday, March 9th to start Xolair sample.  Patient advised to call that morning for us to mix and prepare Xolair.  She also understands she will have to wait 60 minutes after Xolair is administered.

## 2015-05-17 ENCOUNTER — Ambulatory Visit (INDEPENDENT_AMBULATORY_CARE_PROVIDER_SITE_OTHER): Payer: Medicaid Other | Admitting: *Deleted

## 2015-05-17 DIAGNOSIS — L500 Allergic urticaria: Secondary | ICD-10-CM | POA: Diagnosis not present

## 2015-05-17 MED ORDER — OMALIZUMAB 150 MG ~~LOC~~ SOLR
150.0000 mg | Freq: Once | SUBCUTANEOUS | Status: AC
  Administered 2015-05-17: 150 mg via SUBCUTANEOUS

## 2015-05-17 MED ORDER — EPINEPHRINE 0.3 MG/0.3ML IJ SOAJ: INTRAMUSCULAR | Status: DC

## 2015-05-17 NOTE — Progress Notes (Signed)
Immunotherapy   Patient Details  Name: Debra Guerrero MRN: [id number] Date of Birth: 03/19/1985  05/17/2015  Debra Guerrero started injections for  Xolair 150mg  (per Dr Willa RoughHicks give dose to patient today)  Frequency:Every 28 days Epi-Pen:Prescription for Epi-Pen given reviewed and emergency action plan provided Consent signed and patient instructions given. Patient waited 60 min without difficulty  [name] 05/17/2015, 11:07 AM

## 2015-05-28 ENCOUNTER — Telehealth: Payer: Self-pay | Admitting: Allergy and Immunology

## 2015-05-28 NOTE — Telephone Encounter (Signed)
Pt called and wanted to update you on her hives. She said that they are not better. And wants to know if there is any thing that well help. [facility] [address] [phone].

## 2015-05-28 NOTE — Telephone Encounter (Signed)
Patient says that she is taking all the medications she has been prescribed but is still having hives pop up.  She would like to know what else she can do

## 2015-06-03 NOTE — Telephone Encounter (Signed)
For now continue Xolair (I believe she has only received one injection so far, and continue all meds as discussed. If she needs low dose prednisone, we can provide 10mg  daily x 5 days. Ask patient to follow up if needed. Thanks.

## 2015-06-04 NOTE — Telephone Encounter (Signed)
Noted. It is possible that the improvement that she is now experiencing may be due to omalizumab.

## 2015-06-04 NOTE — Telephone Encounter (Signed)
Patient said that she went to her primary care MD who put her on Atarax.  Patient said she doesn't believe she is going to continue with the xolair.  Said her PCP toook her off some of the medications she was placed on.  Also mentioned that she now has improvement

## 2015-06-27 ENCOUNTER — Telehealth: Payer: Self-pay | Admitting: *Deleted

## 2015-06-27 NOTE — Telephone Encounter (Signed)
Have been trying to get patient on Xolair, she received initial dose (sample) on 05/17/15 and got submit on 06/07/15.  Per specialty pharmacy that was trying to service patient they mad contact with patient on 06/26/15 and she advised them that she was not going to take any more Xolair or see our provider.  I only looked to seen note from 05/28/15 that advised she may not continue with therapy and from the notes it doesn't have any comment to indicate that the patient was advised that her improvement she was experiencing was possibly due to Xolair.  Guess at this point we will wait and see if she comes back

## 2015-06-27 NOTE — Telephone Encounter (Signed)
Noted. Thanks.

## 2015-08-07 ENCOUNTER — Other Ambulatory Visit: Payer: Self-pay | Admitting: Allergy and Immunology

## 2015-09-13 ENCOUNTER — Other Ambulatory Visit: Payer: Self-pay | Admitting: Allergy and Immunology

## 2015-10-28 ENCOUNTER — Other Ambulatory Visit: Payer: Self-pay | Admitting: Allergy and Immunology

## 2015-10-28 ENCOUNTER — Other Ambulatory Visit: Payer: Self-pay | Admitting: *Deleted

## 2015-10-28 MED ORDER — CETIRIZINE HCL 10 MG PO TABS: 10.0000 mg | ORAL_TABLET | Freq: Every day | ORAL | 0 refills | Status: DC

## 2015-10-30 ENCOUNTER — Other Ambulatory Visit: Payer: Self-pay | Admitting: Allergy and Immunology

## 2015-10-30 DIAGNOSIS — J3089 Other allergic rhinitis: Secondary | ICD-10-CM

## 2015-10-30 DIAGNOSIS — J4540 Moderate persistent asthma, uncomplicated: Secondary | ICD-10-CM

## 2015-11-18 ENCOUNTER — Encounter (INDEPENDENT_AMBULATORY_CARE_PROVIDER_SITE_OTHER): Payer: Self-pay

## 2015-11-18 ENCOUNTER — Encounter: Payer: Self-pay | Admitting: Allergy and Immunology

## 2015-11-18 ENCOUNTER — Ambulatory Visit (INDEPENDENT_AMBULATORY_CARE_PROVIDER_SITE_OTHER): Payer: Medicaid Other | Admitting: Allergy and Immunology

## 2015-11-18 VITALS — BP 110/75 | HR 70 | Temp 98.0°F | Resp 16

## 2015-11-18 DIAGNOSIS — J3089 Other allergic rhinitis: Secondary | ICD-10-CM | POA: Diagnosis not present

## 2015-11-18 DIAGNOSIS — L501 Idiopathic urticaria: Secondary | ICD-10-CM | POA: Diagnosis not present

## 2015-11-18 DIAGNOSIS — J4540 Moderate persistent asthma, uncomplicated: Secondary | ICD-10-CM

## 2015-11-18 DIAGNOSIS — T7800XD Anaphylactic reaction due to unspecified food, subsequent encounter: Secondary | ICD-10-CM

## 2015-11-18 MED ORDER — MONTELUKAST SODIUM 10 MG PO TABS: 10.0000 mg | ORAL_TABLET | Freq: Every day | ORAL | 5 refills | Status: DC

## 2015-11-18 MED ORDER — ALBUTEROL SULFATE HFA 108 (90 BASE) MCG/ACT IN AERS: 1.0000 | INHALATION_SPRAY | RESPIRATORY_TRACT | 1 refills | Status: DC | PRN

## 2015-11-18 MED ORDER — LEVOCETIRIZINE DIHYDROCHLORIDE 5 MG PO TABS: 5.0000 mg | ORAL_TABLET | Freq: Every evening | ORAL | 5 refills | Status: DC

## 2015-11-18 MED ORDER — BUDESONIDE-FORMOTEROL FUMARATE 80-4.5 MCG/ACT IN AERO: 2.0000 | INHALATION_SPRAY | Freq: Two times a day (BID) | RESPIRATORY_TRACT | 5 refills | Status: DC

## 2015-11-18 NOTE — Progress Notes (Signed)
Follow-up Note  RE: CERISE LIEBER MRN: [id number] DOB: Jan 21, 1986 Date of Office Visit: 11/18/2015  Primary care provider: Willow Ora, MD Referring provider: Willow Ora, MD  History of present illness: Debra Guerrero is a 30 y.o. female with moderate persistent asthma, food allergy, and history of urticaria presenting today for follow up.  She was last seen in this clinic on 04/15/2015.  She reports that in the interval since her previous visit she has had one outbreak of urticaria which was well-controlled and resolved with H1/H2 receptor blockade.  She had one injection of Xolair this spring but is uncertain of benefit.  She reports that her asthma has been "fine."  On average, she has required albuterol rescue fewer than 1 times per month and denies nocturnal awakenings due to lower respiratory symptoms.  She has been avoiding shellfish and has access to epinephrine autoinjector 2 pack.  She needs refill prescriptions for her medications today.   Assessment and plan: Moderate persistent asthma Well-controlled, we will stepdown therapy at this time.  Switch from Symbicort 160/4.5 g to Symbicort 80/4.5 g, 2 inhalations via spacer device twice a day.  If lower respiratory symptoms progress in frequency and/or severity, the patient is to resume the previous dose.  Continue montelukast 10 mg daily at bedtime and albuterol HFA, 1-2 inhalations every 4-6 hours as needed.  Subjective and objective measures of pulmonary function will be followed and the treatment plan will be adjusted accordingly.  Seasonal and perennial allergic rhinitis Stable.  Continue appropriate allergen avoidance measures, montelukast daily, levocetirizine as needed, and fluticasone nasal spray as needed.  Allergy with anaphylaxis due to food  Continue strict avoidance of shellfish and have access to epinephrine autoinjector 2 pack in case of accidental ingestion.  Emergency allergy action plan is in  place.  Urticaria Currently quiescent.  Should symptoms recur, restart/continue H1/H2 receptor blockade, titrating to the lowest effective dose necessary to suppress urticaria.   Meds ordered this encounter  Medications  . levocetirizine (XYZAL) 5 MG tablet    Sig: Take 1 tablet (5 mg total) by mouth every evening.    Dispense:  30 tablet    Refill:  5  . montelukast (SINGULAIR) 10 MG tablet    Sig: Take 1 tablet (10 mg total) by mouth at bedtime.    Dispense:  30 tablet    Refill:  5  . albuterol (PROAIR HFA) 108 (90 Base) MCG/ACT inhaler    Sig: Inhale 1-2 puffs into the lungs every 4 (four) hours as needed for wheezing or shortness of breath.    Dispense:  1 Inhaler    Refill:  1  . budesonide-formoterol (SYMBICORT) 80-4.5 MCG/ACT inhaler    Sig: Inhale 2 puffs into the lungs 2 (two) times daily.    Dispense:  1 Inhaler    Refill:  5    Diagnositics: Spirometry:  Normal with an FEV1 of 95% predicted.  Please see scanned spirometry results for details.    Physical examination: Blood pressure 110/75, pulse 70, temperature 98 F (36.7 C), temperature source Oral, resp. rate 16.  General: Alert, interactive, in no acute distress. HEENT: TMs pearly gray, turbinates mildly edematous without discharge, post-pharynx mildly erythematous. Neck: Supple without lymphadenopathy. Lungs: Clear to auscultation without wheezing, rhonchi or rales. CV: Normal S1, S2 without murmurs. Skin: Warm and dry, without lesions or rashes.  The following portions of the patient's history were reviewed and updated as appropriate: allergies, current medications, past family history, past medical  history, past social history, past surgical history and problem list.    Medication List       Accurate as of 11/18/15  4:21 PM. Always use your most recent med list.          albuterol 108 (90 Base) MCG/ACT inhaler Commonly known as:  PROAIR HFA Inhale 1-2 puffs into the lungs every 4 (four) hours  as needed for wheezing or shortness of breath.   budesonide-formoterol 80-4.5 MCG/ACT inhaler Commonly known as:  SYMBICORT Inhale 2 puffs into the lungs 2 (two) times daily.   cetirizine 10 MG tablet Commonly known as:  ZYRTEC Take 1 tablet (10 mg total) by mouth daily.   EPINEPHrine 0.3 mg/0.3 mL Soaj injection Commonly known as:  EPI-PEN Use as directed for severe allergic reaction   levocetirizine 5 MG tablet Commonly known as:  XYZAL Take 1 tablet (5 mg total) by mouth every evening.   montelukast 10 MG tablet Commonly known as:  SINGULAIR Take 1 tablet (10 mg total) by mouth at bedtime.   mupirocin ointment 2 % Commonly known as:  BACTROBAN APPLY TO AFFECTED AREA 3 TIMES DAILY   ranitidine 150 MG tablet Commonly known as:  ZANTAC Take 1 tablet (150 mg total) by mouth 2 (two) times daily.   XULANE 150-35 MCG/24HR transdermal patch Generic drug:  norelgestromin-ethinyl estradiol APPLY 1 PATCH WEEKLY FOR 3 WEEKS THEN OFF 1 WEEK       Allergies  Allergen Reactions  . Shellfish Allergy Hives and Shortness Of Breath    I appreciate the opportunity to take part in Earma's care. Please do not hesitate to contact me with questions.  Sincerely,   R. Jorene Guestarter Ladarian Bonczek, MD

## 2015-11-18 NOTE — Assessment & Plan Note (Signed)
   Continue strict avoidance of shellfish and have access to epinephrine autoinjector 2 pack in case of accidental ingestion.  Emergency allergy action plan is in place.

## 2015-11-18 NOTE — Assessment & Plan Note (Signed)
Currently quiescent.  Should symptoms recur, restart/continue H1/H2 receptor blockade, titrating to the lowest effective dose necessary to suppress urticaria.

## 2015-11-18 NOTE — Assessment & Plan Note (Signed)
Well-controlled, we will stepdown therapy at this time.  Switch from Symbicort 160/4.5 g to Symbicort 80/4.5 g, 2 inhalations via spacer device twice a day.  If lower respiratory symptoms progress in frequency and/or severity, the patient is to resume the previous dose.  Continue montelukast 10 mg daily at bedtime and albuterol HFA, 1-2 inhalations every 4-6 hours as needed.  Subjective and objective measures of pulmonary function will be followed and the treatment plan will be adjusted accordingly.

## 2015-11-18 NOTE — Patient Instructions (Signed)
Moderate persistent asthma Well-controlled, we will stepdown therapy at this time.  Switch from Symbicort 160/4.5 g to Symbicort 80/4.5 g, 2 inhalations via spacer device twice a day.  If lower respiratory symptoms progress in frequency and/or severity, the patient is to resume the previous dose.  Continue montelukast 10 mg daily at bedtime and albuterol HFA, 1-2 inhalations every 4-6 hours as needed.  Subjective and objective measures of pulmonary function will be followed and the treatment plan will be adjusted accordingly.  Seasonal and perennial allergic rhinitis Stable.  Continue appropriate allergen avoidance measures, montelukast daily, levocetirizine as needed, and fluticasone nasal spray as needed.  Allergy with anaphylaxis due to food  Continue strict avoidance of shellfish and have access to epinephrine autoinjector 2 pack in case of accidental ingestion.  Emergency allergy action plan is in place.  Urticaria Currently quiescent.  Should symptoms recur, restart/continue H1/H2 receptor blockade, titrating to the lowest effective dose necessary to suppress urticaria.   Return in about 4 months (around 03/19/2016), or if symptoms worsen or fail to improve.

## 2015-11-18 NOTE — Assessment & Plan Note (Signed)
Stable.  Continue appropriate allergen avoidance measures, montelukast daily, levocetirizine as needed, and fluticasone nasal spray as needed. 

## 2015-12-02 ENCOUNTER — Other Ambulatory Visit: Payer: Self-pay | Admitting: Allergy and Immunology

## 2016-09-30 ENCOUNTER — Other Ambulatory Visit: Payer: Self-pay | Admitting: Allergy and Immunology

## 2016-09-30 DIAGNOSIS — J4540 Moderate persistent asthma, uncomplicated: Secondary | ICD-10-CM

## 2016-11-11 ENCOUNTER — Other Ambulatory Visit: Payer: Self-pay | Admitting: Allergy and Immunology

## 2016-11-11 DIAGNOSIS — J4540 Moderate persistent asthma, uncomplicated: Secondary | ICD-10-CM

## 2016-11-11 DIAGNOSIS — L501 Idiopathic urticaria: Secondary | ICD-10-CM

## 2016-11-11 DIAGNOSIS — K2970 Gastritis, unspecified, without bleeding: Secondary | ICD-10-CM

## 2016-11-11 DIAGNOSIS — J3089 Other allergic rhinitis: Secondary | ICD-10-CM

## 2017-01-03 ENCOUNTER — Other Ambulatory Visit: Payer: Self-pay | Admitting: Allergy and Immunology

## 2017-01-03 DIAGNOSIS — J3089 Other allergic rhinitis: Secondary | ICD-10-CM

## 2017-01-03 DIAGNOSIS — J4540 Moderate persistent asthma, uncomplicated: Secondary | ICD-10-CM

## 2017-01-03 DIAGNOSIS — K2970 Gastritis, unspecified, without bleeding: Secondary | ICD-10-CM

## 2017-01-03 DIAGNOSIS — L501 Idiopathic urticaria: Secondary | ICD-10-CM

## 2017-01-07 ENCOUNTER — Ambulatory Visit (INDEPENDENT_AMBULATORY_CARE_PROVIDER_SITE_OTHER): Payer: Medicaid Other | Admitting: Allergy & Immunology

## 2017-01-07 ENCOUNTER — Encounter: Payer: Self-pay | Admitting: Allergy & Immunology

## 2017-01-07 ENCOUNTER — Other Ambulatory Visit: Payer: Self-pay | Admitting: Allergy & Immunology

## 2017-01-07 VITALS — BP 126/78 | HR 76 | Temp 98.7°F | Resp 18 | Ht 65.0 in | Wt 229.0 lb

## 2017-01-07 DIAGNOSIS — T7800XD Anaphylactic reaction due to unspecified food, subsequent encounter: Secondary | ICD-10-CM | POA: Diagnosis not present

## 2017-01-07 DIAGNOSIS — J4540 Moderate persistent asthma, uncomplicated: Secondary | ICD-10-CM

## 2017-01-07 DIAGNOSIS — J3089 Other allergic rhinitis: Secondary | ICD-10-CM

## 2017-01-07 MED ORDER — MONTELUKAST SODIUM 10 MG PO TABS: 10.0000 mg | ORAL_TABLET | Freq: Every day | ORAL | 5 refills | Status: DC

## 2017-01-07 MED ORDER — EPINEPHRINE 0.3 MG/0.3ML IJ SOAJ: INTRAMUSCULAR | 1 refills | Status: DC

## 2017-01-07 MED ORDER — ALBUTEROL SULFATE HFA 108 (90 BASE) MCG/ACT IN AERS: INHALATION_SPRAY | RESPIRATORY_TRACT | 1 refills | Status: DC

## 2017-01-07 MED ORDER — CETIRIZINE HCL 10 MG PO TABS: 10.0000 mg | ORAL_TABLET | Freq: Every day | ORAL | 5 refills | Status: DC

## 2017-01-07 MED ORDER — BUDESONIDE-FORMOTEROL FUMARATE 160-4.5 MCG/ACT IN AERO: 2.0000 | INHALATION_SPRAY | Freq: Two times a day (BID) | RESPIRATORY_TRACT | 5 refills | Status: DC

## 2017-01-07 NOTE — Telephone Encounter (Signed)
[facility] stated that [facility] said that her insurance has been covering the cetirizine and she wants it sent in as well. I explained to her that the epipen was sent in. I have sent the cetirizine.

## 2017-01-07 NOTE — Patient Instructions (Addendum)
1. Moderate persistent asthma, uncomplicated - Lung testing looked great today, and it looked even better after the treatment.  - Start the steroid pack provided today since you have needed albuterol multiple times per day for the past few months.  - We will not make any medication changes at this time. - Daily controller medication(s): Singulair 10mg  daily and Symbicort 160/4.435mcg two puffs twice daily with spacer - Prior to physical activity: Ventolin 2 puffs 10-15 minutes before physical activity. - Rescue medications: Ventolin 4 puffs every 4-6 hours as needed - Asthma control goals:  * Full participation in all desired activities (may need albuterol before activity) * Albuterol use two time or less a week on average (not counting use with activity) * Cough interfering with sleep two time or less a month * Oral steroids no more than once a year * No hospitalizations  2. Seasonal and perennial allergic rhinitis - Continue with fluticasone nasal spray 1-2 sprays per nostril daily. - Continue with levocetirizine 5mg  daily as needed. - Continue with montelukast 10mg  daily.   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 - EpiPen refilled and anaphylaxis management reviewed.  4. Return in about 6 months (around 07/07/2017).   Please inform us of any Emergency Department visits, hospitalizations, or changes in symptoms. Call us before going to the ED for breathing or allergy symptoms since we might be able to fit you in for a sick visit. Feel free to contact us anytime with any questions, problems, or concerns.  It was a pleasure to meet you today! Enjoy the fall season!  Websites that have reliable patient information: 1. American Academy of Asthma, Allergy, and Immunology: www.aaaai.org 2. Food Allergy Research and Education (FARE): foodallergy.org 3. Mothers of Asthmatics: http://www.asthmacommunitynetwork.org 4. American College of Allergy,  Asthma, and Immunology: www.acaai.org   Election Day is coming up on Tuesday, November 6th! Although it is too late to register to vote by mail, you can still register up to November 5th at any of the early voting locations. Try to early vote in case there are problems with your registration!   If you are turned away at the polls, you have the right to request a provisional ballot, which is required by law!      [facility] (open 8am - 5pm) First Floor [address], [place]   [place] (open 8am - 5pm) [address], [place]   [facility] (open 7am - 7pm)  [address], [place]   [facility] (open 7am - 7pm) [address], [place]   [place] (open 7am - 7pm) [address], [place]   [facility] (open 7am - 7pm) [address], [place]   [facility] (open 7am - 7pm) [address], [place]   [address] (open 7am - 7pm) [address], [place]   [facility] (open 7am - 7pm) [address], [place]

## 2017-01-07 NOTE — Progress Notes (Signed)
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 Moderate persistent asthma, uncomplicated - Lung testing looked great today, and it did not improve with the nebulizer treatment.  - Her report of symptoms does not necessarily fit her testing today at all. - Her lack of patience and refusal to fully answer questions make it difficult to gauge her control and severity of symptoms.  - However, we did make the decision to start her on a steroid burst today since she has needed albuterol multiple times per day for the past few months.  - We will not make any medication changes at this time, but will consider the addition of an anti-IL5 agent. - We did order a serum IgE level to see whether she would qualify for Xolair, but unfortunately we were unable to obtain the lab. - After she left, we found her lab order in a trashcan in the lobby.  - Daily controller medication(s): Singulair 10mg  daily and Symbicort 160/4.31mcg two puffs twice daily with spacer - Prior to physical activity: Ventolin 2 puffs 10-15 minutes before physical activity. - Rescue medications: Ventolin 4 puffs every 4-6 hours as needed - Asthma control goals:  * Full participation in all desired activities (may need albuterol before activity) * Albuterol use two time or less a week on average (not counting use with activity) * Cough interfering with sleep two time or less a month * Oral steroids no more than once a year * No hospitalizations  2. Seasonal and perennial allergic rhinitis - Continue with fluticasone nasal spray 1-2 sprays per nostril daily. - Continue with levocetirizine 5mg  daily as  needed. - Continue with montelukast 10mg  daily.   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 - EpiPen refilled and anaphylaxis management reviewed.  4. Return in about 6 months (around 07/07/2017).   Subjective:   Debra Guerrero is a 31 y.o. female presenting today for follow up of  Chief Complaint  Patient presents with  . Asthma    not been doing too well.     Debra Guerrero has a history of the following: Patient Active Problem List   Diagnosis Date Noted  . Urticaria 03/26/2015  . Moderate persistent asthma 03/26/2015  . 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 Encephalopathy acute 09/11/2011  . Tachycardia 09/11/2011  . Seasonal and perennial allergic rhinitis 09/11/2011  . Hypokalemia 09/11/2011  . Altered mental status 09/11/2011  . 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 MIGRAINE, COMMON, INTRACTABLE 04/25/2010    History obtained from: chart review and the patient.  Debra Guerrero's Primary Care Provider is Willow Ora, MD.     Debra Guerrero is a 31 y.o. female presenting for a follow up visit.  She was last seen in September 2017 by Dr. Nunzio Cobbs.  At that time, her asthma was well controlled, therefore he stepdown therapy from Symbicort 160 to Symbicort 82 puffs in the morning and 2 puffs at night.  She was continued on Singulair 10 mg as well as albuterol as needed.  Her allergic rhinitis was stable with Flonase and Xyzal as  needed.  She continue to avoid shellfish due to history of anaphylaxis.  Since the last visit, she has done well. She is rather rude today because she is being seen 15 minutes after her appointment time. She is rather short during the visit. She remains on the Symbicort, but evidently she changed from the 80mcg dose to the 160mcg dose following the last visit. She reports that she had increased coughing on the lower dose.  She remains on her Singulair, although it seems that she has not been taking it in a couple of months at least due to lack of refills. For the past three months, she reports that she has been using her albuterol around 3-4 times daily, which does provide some relief of her wheezing and coughing. She has had no fever during this time and has not sought help in the ED or Urgent Care. She has not been on prednisone at all. She denies sinus pain and pressure. Of note, she has had an absolute eosinophil count of 400 as recently as May 2018.   Her rhinitis has been less of an issue.  She is not taking anything regularly for her nasal symptoms.  She continues to avoid shellfish due to her history of anaphylaxis.  She does have reflux but does not take anything at all for it. This is only a problem with certain foods. 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 Otherwise, there have been no changes to her past medical history, surgical history, family history, or social history.    Review of Systems: a 14-point review of systems is pertinent for what is mentioned in HPI.  Otherwise, all other systems were negative. Constitutional: negative other than that listed in the HPI Eyes: negative other than that listed in the HPI Ears, nose, mouth, throat, and face: negative other than that listed in the HPI Respiratory: negative other than that listed in the HPI Cardiovascular: negative other than that listed in the HPI Gastrointestinal: negative other than that listed in the HPI Genitourinary: negative other than that listed in the HPI Integument: negative other than that listed in the HPI Hematologic: negative other than that listed in the HPI Musculoskeletal: negative other than that listed in the HPI Neurological: negative other than that listed in the HPI Allergy/Immunologic: negative other than that listed in the HPI    Objective:   Blood pressure 126/78, pulse 76,  temperature 98.7 F (37.1 C), temperature source Oral, resp. rate 18, height 5\' 5"  (1.651 m), weight 229 lb (103.9 kg), SpO2 98 %. Body mass index is 38.11 kg/m.   Physical Exam:  General: Alert, interactive, in no acute distress.  Impatient female. Eyes: No conjunctival injection bilaterally, no discharge on the right, no discharge on the left and no Horner-Trantas dots present. PERRL bilaterally. EOMI without pain. No photophobia.  Ears: Right TM pearly gray with normal light reflex, Left TM pearly gray with normal light reflex, Right TM intact without perforation and Left TM intact without perforation.  Nose/Throat: External nose within normal limits and septum midline. Turbinates edematous and pale with clear discharge. Posterior oropharynx erythematous with cobblestoning in the posterior oropharynx. Tonsils 2+ without exudates.  Tongue without thrush. Adenopathy: no enlarged lymph nodes appreciated in the anterior cervical, occipital, axillary, epitrochlear, inguinal, or popliteal regions. Lungs: Clear to auscultation without wheezing, rhonchi or rales. No increased work of breathing. CV: Normal S1/S2. No murmurs. Capillary refill <2 seconds.  Skin: Warm and dry,  without lesions or rashes. Neuro:   Grossly intact. No focal deficits appreciated. Responsive to questions.  Diagnostic studies:   Spirometry: results normal (FEV1: 2.37/84%, FVC: 2.99/90%, FEV1/FVC: 79%).    Spirometry consistent with normal pattern. Albuterol/Atrovent nebulizer treatment given in clinic with no improvement.  Overall, she did have poor effort however.  She did report feeling better after the albuterol treatment.  Allergy Studies: none     Malachi Bonds, MD [facility]

## 2017-01-07 NOTE — Telephone Encounter (Signed)
Patient was seen today, 01-07-17, by Dr. Dellis AnesGallagher. She went to pick up her perscriptions and the did not have one for her Epi Pen or cetirizine. I told her the Epi Pen looks like was received by the pharmacy, but she said they said they did not have anything for it. [facility] [address]

## 2017-05-05 ENCOUNTER — Other Ambulatory Visit: Payer: Self-pay

## 2017-05-05 ENCOUNTER — Emergency Department (HOSPITAL_BASED_OUTPATIENT_CLINIC_OR_DEPARTMENT_OTHER)
Admission: EM | Admit: 2017-05-05 | Discharge: 2017-05-05 | Disposition: A | Discharge status: Home / Self Care | Payer: PRIVATE HEALTH INSURANCE | Attending: Emergency Medicine | Admitting: Emergency Medicine

## 2017-05-05 ENCOUNTER — Encounter (HOSPITAL_BASED_OUTPATIENT_CLINIC_OR_DEPARTMENT_OTHER): Payer: Self-pay

## 2017-05-05 DIAGNOSIS — Z79899 Other long term (current) drug therapy: Secondary | ICD-10-CM | POA: Insufficient documentation

## 2017-05-05 DIAGNOSIS — J45909 Unspecified asthma, uncomplicated: Secondary | ICD-10-CM | POA: Insufficient documentation

## 2017-05-05 DIAGNOSIS — J209 Acute bronchitis, unspecified: Secondary | ICD-10-CM | POA: Insufficient documentation

## 2017-05-05 DIAGNOSIS — R0602 Shortness of breath: Secondary | ICD-10-CM | POA: Diagnosis present

## 2017-05-05 DIAGNOSIS — Z87891 Personal history of nicotine dependence: Secondary | ICD-10-CM | POA: Insufficient documentation

## 2017-05-05 DIAGNOSIS — R0981 Nasal congestion: Secondary | ICD-10-CM | POA: Insufficient documentation

## 2017-05-05 MED ORDER — DEXAMETHASONE SODIUM PHOSPHATE 10 MG/ML IJ SOLN
10.0000 mg | Freq: Once | INTRAMUSCULAR | Status: AC
  Administered 2017-05-05: 10 mg via INTRAMUSCULAR
  Filled 2017-05-05: qty 1

## 2017-05-05 MED ORDER — OXYMETAZOLINE HCL 0.05 % NA SOLN
2.0000 | Freq: Two times a day (BID) | NASAL | Status: DC | PRN
  Administered 2017-05-05: 2 via NASAL
  Filled 2017-05-05 (×2): qty 15

## 2017-05-05 MED ORDER — IPRATROPIUM-ALBUTEROL 0.5-2.5 (3) MG/3ML IN SOLN
RESPIRATORY_TRACT | Status: AC
  Filled 2017-05-05: qty 3

## 2017-05-05 MED ORDER — IPRATROPIUM-ALBUTEROL 0.5-2.5 (3) MG/3ML IN SOLN
3.0000 mL | RESPIRATORY_TRACT | Status: DC
  Administered 2017-05-05: 3 mL via RESPIRATORY_TRACT

## 2017-05-05 NOTE — ED Provider Notes (Signed)
[facility] Provider Note: Lowella DellJ. Lane Reegan Bouffard, MD, FACEP  CSN: [id number] MRN: [id number] ARRIVAL: 05/05/17 at 0229 ROOM: [facility]03/[facility]03   CHIEF COMPLAINT  Shortness of Breath   HISTORY OF PRESENT ILLNESS  05/05/17 3:42 AM Debra Guerrero is a 32 y.o. female with a history of asthma and seasonal allergies.  She is here for cough times 1 week with 2 days of shortness of breath.  She is also having chest wall discomfort with deep breathing and coughing.  She rates this pain as an 8 out of 10.  She has been using her inhalers and neb machine without adequate relief.  She is also having nasal congestion but this is not a great concern to her.  She is not aware of having a fever.  She denies vomiting or diarrhea.   Past Medical History:  Diagnosis Date  . Asthma   . Depression   . Headache(784.0)   . 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 Seasonal allergies 09/11/2011    Past Surgical History:  Procedure Laterality Date  . NO PAST SURGERIES      Family History  Problem Relation Age of Onset  . Asthma Mother   . Asthma Father   . Eczema Sister   . Allergic rhinitis Neg Hx   . Angioedema Neg Hx   . Atopy Neg Hx   . Immunodeficiency Neg Hx   . Urticaria Neg Hx     Social History   Tobacco Use  . Smoking status: Former Smoker    Types: Cigarettes  . Smokeless tobacco: Never Used  Substance Use Topics  . Alcohol use: No  . Drug use: No    Prior to Admission medications   Medication Sig Start Date End Date Taking? Authorizing Provider  albuterol (PROVENTIL HFA;VENTOLIN HFA) 108 (90 Base) MCG/ACT inhaler 1-2 puffs by mouth every 4-6 hours as needed. 01/07/17   Alfonse SpruceGallagher, Joel Louis, MD  budesonide-formoterol [facility](SYMBICORT) 160-4.5 MCG/ACT inhaler Inhale 2 puffs into the lungs 2 (two) times daily. 01/07/17   Alfonse SpruceGallagher, Joel Louis, MD  cetirizine (ZYRTEC) 10 MG tablet Take 1 tablet (10 mg total) by mouth daily. 01/07/17   Alfonse SpruceGallagher, Joel Louis, MD  EPINEPHrine 0.3  mg/0.3 mL IJ SOAJ injection Use as directed for severe allergic reaction 01/07/17   Alfonse SpruceGallagher, Joel Louis, MD  montelukast (SINGULAIR) 10 MG tablet Take 1 tablet (10 mg total) by mouth at bedtime. 01/07/17   Alfonse SpruceGallagher, Joel Louis, MD  mupirocin ointment (BACTROBAN) 2 % APPLY TO AFFECTED AREA 3 TIMES DAILY 06/13/15   [provider]  naproxen (NAPROSYN) 500 MG tablet Take 500 mg by mouth 2 (two) times daily with a meal. 10/08/16   [provider]  ranitidine (ZANTAC) 150 MG tablet TAKE 1 TABLET (150 MG TOTAL) BY MOUTH 2 (TWO) TIMES DAILY. 11/12/16   Bobbitt, Heywood Ilesalph Carter, MD  TRI-LO-MARZIA 0.18/0.215/0.25 MG-25 MCG tab Take 1 tablet by mouth daily. 12/27/16   [provider]  propranolol (INDERAL) 10 MG tablet Take 1 tablet (10 mg total) by mouth 3 (three) times daily. 09/24/11 03/20/15  Rodolph Bonghompson, Daniel V, MD  QUEtiapine (SEROQUEL XR) 300 MG 24 hr tablet Take 1 tablet (300 mg total) by mouth daily. Please give nightly at 8pm 09/24/11 03/20/15  Rodolph Bonghompson, Daniel V, MD    Allergies Shellfish allergy   REVIEW OF SYSTEMS  Negative except as noted here or in the History of Present Illness.   PHYSICAL EXAMINATION  Initial Vital Signs Blood pressure 130/90, pulse 91, temperature  98.5 F (36.9 C), temperature source Oral, resp. rate 16, height 5\' 5"  (1.651 m), weight 104.3 kg (230 lb), last menstrual period 04/21/2017, SpO2 99 %.  Examination General: Well-developed, well-nourished female in no acute distress; appearance consistent with age of record HENT: normocephalic; atraumatic; nasal congestion Eyes: pupils equal, round and reactive to light; extraocular muscles intact Neck: supple Heart: regular rate and rhythm Lungs: Expiratory wheeze in left base Abdomen: soft; nondistended; nontender; bowel sounds present Extremities: No deformity; full range of motion; pulses normal Neurologic: Awake, alert and oriented; motor function intact in all extremities and symmetric; no facial  droop Skin: Warm and dry Psychiatric: Normal mood and affect   RESULTS  Summary of this visit's results, reviewed by myself:   EKG Interpretation  Date/Time:    Ventricular Rate:    PR Interval:    QRS Duration:   QT Interval:    QTC Calculation:   R Axis:     Text Interpretation:        Laboratory Studies: No results found for this or any previous visit (from the past 24 hour(s)). Imaging Studies: No results found.  ED COURSE  Nursing notes and initial vitals signs, including pulse oximetry, reviewed.  Vitals:   05/05/17 0238 05/05/17 0239 05/05/17 0248 05/05/17 0400  BP: 130/90     Pulse: 91     Resp: 16     Temp: 98.5 F (36.9 C)     TempSrc: Oral     SpO2: 99%  99% 99%  Weight:  104.3 kg (230 lb)    Height:  5\' 5"  (1.651 m)     4:19 AM Patient feeling significantly better after DuoNeb treatment.  Wheezing has resolved.  She has an appointment with her physician tomorrow.  PROCEDURES    ED DIAGNOSES     ICD-10-CM   1. Acute bronchitis with bronchospasm J20.9   2. Nasal congestion R09.81        Paula Libra, MD 05/05/17 [id number]

## 2017-05-05 NOTE — ED Triage Notes (Signed)
Pt c/o cough x1wk, SOB x2days, states using neb x2, inhaler and steroids with no relief

## 2017-05-05 NOTE — ED Notes (Signed)
Pt discharged to home with family. NAD.  

## 2017-08-29 ENCOUNTER — Emergency Department (HOSPITAL_BASED_OUTPATIENT_CLINIC_OR_DEPARTMENT_OTHER): Payer: No Typology Code available for payment source

## 2017-08-29 ENCOUNTER — Emergency Department (HOSPITAL_BASED_OUTPATIENT_CLINIC_OR_DEPARTMENT_OTHER)
Admission: EM | Admit: 2017-08-29 | Discharge: 2017-08-29 | Disposition: A | Discharge status: Home / Self Care | Payer: No Typology Code available for payment source | Attending: Emergency Medicine | Admitting: Emergency Medicine

## 2017-08-29 ENCOUNTER — Other Ambulatory Visit: Payer: Self-pay

## 2017-08-29 ENCOUNTER — Encounter (HOSPITAL_BASED_OUTPATIENT_CLINIC_OR_DEPARTMENT_OTHER): Payer: Self-pay | Admitting: Emergency Medicine

## 2017-08-29 DIAGNOSIS — R51 Headache: Secondary | ICD-10-CM | POA: Diagnosis not present

## 2017-08-29 DIAGNOSIS — Z23 Encounter for immunization: Secondary | ICD-10-CM | POA: Diagnosis not present

## 2017-08-29 DIAGNOSIS — M542 Cervicalgia: Secondary | ICD-10-CM | POA: Diagnosis not present

## 2017-08-29 DIAGNOSIS — J4540 Moderate persistent asthma, uncomplicated: Secondary | ICD-10-CM | POA: Diagnosis not present

## 2017-08-29 DIAGNOSIS — Z79899 Other long term (current) drug therapy: Secondary | ICD-10-CM | POA: Insufficient documentation

## 2017-08-29 DIAGNOSIS — M25512 Pain in left shoulder: Secondary | ICD-10-CM | POA: Diagnosis not present

## 2017-08-29 DIAGNOSIS — Z87891 Personal history of nicotine dependence: Secondary | ICD-10-CM | POA: Insufficient documentation

## 2017-08-29 LAB — PREGNANCY, URINE: Preg Test, Ur: NEGATIVE

## 2017-08-29 MED ORDER — IBUPROFEN 800 MG PO TABS: 800.0000 mg | ORAL_TABLET | Freq: Three times a day (TID) | ORAL | 0 refills | Status: DC

## 2017-08-29 MED ORDER — ACETAMINOPHEN 500 MG PO TABS
1000.0000 mg | ORAL_TABLET | Freq: Once | ORAL | Status: AC
  Administered 2017-08-29: 1000 mg via ORAL
  Filled 2017-08-29: qty 2

## 2017-08-29 MED ORDER — TETANUS-DIPHTH-ACELL PERTUSSIS 5-2.5-18.5 LF-MCG/0.5 IM SUSP
0.5000 mL | Freq: Once | INTRAMUSCULAR | Status: AC
  Administered 2017-08-29: 0.5 mL via INTRAMUSCULAR
  Filled 2017-08-29: qty 0.5

## 2017-08-29 MED ORDER — METHOCARBAMOL 500 MG PO TABS: 500.0000 mg | ORAL_TABLET | Freq: Two times a day (BID) | ORAL | 0 refills | Status: DC

## 2017-08-29 MED ORDER — ACETAMINOPHEN 500 MG PO TABS: 500.0000 mg | ORAL_TABLET | Freq: Four times a day (QID) | ORAL | 0 refills | Status: DC | PRN
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
## 2017-10-20 ENCOUNTER — Ambulatory Visit (INDEPENDENT_AMBULATORY_CARE_PROVIDER_SITE_OTHER): Payer: Self-pay | Admitting: Allergy

## 2017-10-20 ENCOUNTER — Encounter: Payer: Self-pay | Admitting: Allergy

## 2017-10-20 VITALS — BP 110/68 | HR 98 | Temp 97.9°F | Resp 18 | Ht 65.0 in | Wt 230.0 lb

## 2017-10-20 DIAGNOSIS — J3089 Other allergic rhinitis: Secondary | ICD-10-CM

## 2017-10-20 DIAGNOSIS — T7800XD Anaphylactic reaction due to unspecified food, subsequent encounter: Secondary | ICD-10-CM

## 2017-10-20 DIAGNOSIS — J4540 Moderate persistent asthma, uncomplicated: Secondary | ICD-10-CM

## 2017-10-20 NOTE — Progress Notes (Signed)
Spiro

## 2017-10-20 NOTE — Progress Notes (Signed)
Follow-up Note  RE: Debra Guerrero MRN: [id number] DOB: 02/17/1986 Date of Office Visit: 10/20/2017   History of present illness: Debra Guerrero is a 32 y.o. female presenting today for follow-up of asthma, allergic rhinitis, and shellfish allergy. She was last seen in the office on 01/07/17 by Dr. Dellis AnesGallagher.  She reports that she has not been on her Symbicort or Singulair for about 2 months because she ran out of these medicine. Since that time, she has been using her albuterol multiple times a day for symptoms of wheezing, shortness of breath, and cough. These symptoms wake her up at night 3-4 times per week and require nighttime albuterol. She has not been to the ED or received steroids for her asthma during this time. When she was consistently on the regimen of Symbicort and Singulair, she reports much better control and rare use of her albuterol.    For her allergic rhinitis, she reports taking zyrtec as needed, which controls her symptoms well.   For her food allergies, she continues to avoid shellfish. She has not had any accidental ingestions or reactions and has access to an epi-pen.  Review of systems: Review of Systems  Constitutional: Negative for chills, fever and malaise/fatigue.  HENT: Negative for congestion, ear discharge, ear pain, nosebleeds, sinus pain and sore throat.   Eyes: Negative for pain, discharge and redness.  Respiratory: Positive for cough, shortness of breath and wheezing. Negative for hemoptysis and sputum production.   Cardiovascular: Negative for chest pain.  Gastrointestinal: Negative for abdominal pain, constipation, diarrhea, nausea and vomiting.  Musculoskeletal: Negative for joint pain.  Skin: Negative for itching and rash.  Neurological: Negative for headaches.    All other systems negative unless noted above in HPI  Past medical/social/surgical/family history have been reviewed and are unchanged unless specifically indicated below.  No  changes  Medication List: Allergies as of 10/20/2017      Reactions   Shellfish Allergy Hives, Shortness Of Breath      Medication List        Accurate as of 10/20/17  4:48 PM. Always use your most recent med list.          albuterol 108 (90 Base) MCG/ACT inhaler Commonly known as:  PROVENTIL HFA;VENTOLIN HFA 1-2 puffs by mouth every 4-6 hours as needed.   budesonide-formoterol 160-4.5 MCG/ACT inhaler Commonly known as:  SYMBICORT Inhale 2 puffs into the lungs 2 (two) times daily.   cetirizine 10 MG tablet Commonly known as:  ZYRTEC Take 1 tablet (10 mg total) by mouth daily.   EPINEPHrine 0.3 mg/0.3 mL Soaj injection Commonly known as:  EPI-PEN Use as directed for severe allergic reaction   ibuprofen 800 MG tablet Commonly known as:  ADVIL,MOTRIN Take 1 tablet (800 mg total) by mouth 3 (three) times daily.   montelukast 10 MG tablet Commonly known as:  SINGULAIR Take 1 tablet (10 mg total) by mouth at bedtime.   mupirocin ointment 2 % Commonly known as:  BACTROBAN APPLY TO AFFECTED AREA 3 TIMES DAILY   naproxen 500 MG tablet Commonly known as:  NAPROSYN Take 500 mg by mouth 2 (two) times daily with a meal.   ranitidine 150 MG tablet Commonly known as:  ZANTAC TAKE 1 TABLET (150 MG TOTAL) BY MOUTH 2 (TWO) TIMES DAILY.   TRI-LO-MARZIA 0.18/0.215/0.25 MG-25 MCG tab Generic drug:  Norgestimate-Ethinyl Estradiol Triphasic Take 1 tablet by mouth daily.       Known medication allergies: Allergies  Allergen Reactions  .  Shellfish Allergy Hives and Shortness Of Breath     Physical examination: Blood pressure 110/68, pulse 98, temperature 97.9 F (36.6 C), temperature source Oral, resp. rate 18, height 5\' 5"  (1.651 m), weight 230 lb (104.3 kg), SpO2 97 %.  General: Alert, interactive, in no acute distress. HEENT: TMs pearly gray, turbinates minimally edematous with clear discharge, post-pharynx mildly erythematous. Neck: Supple without  lymphadenopathy. Lungs: Clear to auscultation without wheezing, rhonchi or rales. {no increased work of breathing. CV: Normal S1, S2 without murmurs. Abdomen: Nondistended, nontender. Skin: Warm and dry, without lesions or rashes. Extremities:  No clubbing, cyanosis or edema. Neuro:   Grossly intact.  Diagnositics/Labs: Labs: IgE ordered at last visit never drawn  Spirometry: FEV1: 2.18L 78%, FVC: 2.5L 76%.  Function is decreased from previous stduy   Assessment and plan:   1. Moderate persistent asthma - resume inhaler medications to improve control of symptoms - Daily controller medication(s): Singulair 10mg  daily and Symbicort 160/4.795mcg two puffs twice daily with spacer - Prior to physical activity: Ventolin 2 puffs 10-15 minutes before physical activity. - Rescue medications: Ventolin 4 puffs every 4-6 hours as needed - Asthma control goals:  * Full participation in all desired activities (may need albuterol before activity) * Albuterol use two time or less a week on average (not counting use with activity) * Cough interfering with sleep two time or less a month * Oral steroids no more than once a year * No hospitalizations - will obtain serum IgE with environmental panel and CBC w diff to determine if you are eligible for any asthma biologic agents (anti-IL 5, anti-IL4Ra or anti-IgE)  2. Seasonal and perennial allergic rhinitis - Continue with fluticasone nasal spray 1-2 sprays per nostril daily. - Continue with levocetirizine 5mg  daily as needed. - Continue with montelukast 10mg  daily.   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 - EpiPen refilled and anaphylaxis management reviewed.  4. Return in about 3-4 months or sooner if needed  I appreciate the opportunity to take part in Debra Guerrero's care. Please do not hesitate to contact me with questions.  Sincerely,   Margo AyeShaylar Padgett, MD Allergy/Immunology [facility]  Jaynie Breameborah Rhegan Trunnell, MD PGY-1

## 2017-10-20 NOTE — Patient Instructions (Addendum)
1. Moderate persistent asthma - resume inhaler medications to improve control of symptoms - Daily controller medication(s): Singulair 10mg  daily and Symbicort 160/4.145mcg two puffs twice daily with spacer - Prior to physical activity: Ventolin 2 puffs 10-15 minutes before physical activity. - Rescue medications: Ventolin 4 puffs every 4-6 hours as needed - Asthma control goals:  * Full participation in all desired activities (may need albuterol before activity) * Albuterol use two time or less a week on average (not counting use with activity) * Cough interfering with sleep two time or less a month * Oral steroids no more than once a year * No hospitalizations - will obtain serum IgE with environmental panel and CBC w diff to determine if you are eligible for any asthma biologic agents  2. Seasonal and perennial allergic rhinitis - Continue with fluticasone nasal spray 1-2 sprays per nostril daily. - Continue with levocetirizine 5mg  daily as needed. - Continue with montelukast 10mg  daily.   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 - EpiPen refilled and anaphylaxis management reviewed.  4. Return in about 3-4 months or sooner if needed

## 2017-10-24 LAB — CBC WITH DIFFERENTIAL/PLATELET
BASOS: 0 %
Basophils Absolute: 0 10*3/uL (ref 0.0–0.2)
EOS (ABSOLUTE): 0.2 10*3/uL (ref 0.0–0.4)
Eos: 1 %
Hematocrit: 41.7 % (ref 34.0–46.6)
Hemoglobin: 13.3 g/dL (ref 11.1–15.9)
Immature Grans (Abs): 0 10*3/uL (ref 0.0–0.1)
Immature Granulocytes: 0 %
Lymphocytes Absolute: 3.6 10*3/uL — ABNORMAL HIGH (ref 0.7–3.1)
Lymphs: 21 %
MCH: 27.7 pg (ref 26.6–33.0)
MCHC: 31.9 g/dL (ref 31.5–35.7)
MCV: 87 fL (ref 79–97)
MONOS ABS: 0.4 10*3/uL (ref 0.1–0.9)
Monocytes: 3 %
NEUTROS ABS: 12.6 10*3/uL — AB (ref 1.4–7.0)
Neutrophils: 75 %
PLATELETS: 316 10*3/uL (ref 150–450)
RBC: 4.8 x10E6/uL (ref 3.77–5.28)
RDW: 13.6 % (ref 12.3–15.4)
WBC: 16.9 10*3/uL — ABNORMAL HIGH (ref 3.4–10.8)

## 2017-10-24 LAB — ALLERGENS W/TOTAL IGE AREA 2
Aspergillus Fumigatus IgE: 0.71 kU/L — AB
COCKROACH, GERMAN IGE: 7.09 kU/L — AB
COTTONWOOD IGE: 8.11 kU/L — AB
Cedar, Mountain IgE: 11.6 kU/L — AB
Common Silver Birch IgE: 13.8 kU/L — AB
D Pteronyssinus IgE: 48.1 kU/L — AB
D002-IGE D FARINAE: 43.4 kU/L — AB
Dog Dander IgE: 1.82 kU/L — AB
E001-IGE CAT DANDER: 3.68 kU/L — AB
Elm, American IgE: 11.7 kU/L — AB
G002-IGE BERMUDA GRASS: 11.5 kU/L — AB
G010-IGE JOHNSON GRASS: 9.86 kU/L — AB
IgE (Immunoglobulin E), Serum: 484 IU/mL (ref 6–495)
M001-IGE PENICILLIUM CHRYSOGEN: 0.3 kU/L — AB
M002-IGE CLADOSPORIUM HERBARUM: 0.27 kU/L — AB
M006-IGE ALTERNARIA ALTERNATA: 3.26 kU/L — AB
Maple/Box Elder IgE: 10.9 kU/L — AB
Mouse Urine IgE: 0.12 kU/L — AB
PECAN, HICKORY IGE: 14.1 kU/L — AB
Pigweed, Rough IgE: 8.14 kU/L — AB
Ragweed, Short IgE: 8.93 kU/L — AB
Sheep Sorrel IgE Qn: 7.76 kU/L — AB
T007-IGE OAK, WHITE: 15 kU/L — AB
TIMOTHY IGE: 14.7 kU/L — AB
WHITE MULBERRY IGE: 7.02 kU/L — AB

## 2017-11-23 ENCOUNTER — Other Ambulatory Visit: Payer: Self-pay | Admitting: Allergy

## 2017-11-23 ENCOUNTER — Telehealth: Payer: Self-pay | Admitting: Allergy

## 2017-11-23 DIAGNOSIS — J4540 Moderate persistent asthma, uncomplicated: Secondary | ICD-10-CM

## 2017-11-23 DIAGNOSIS — K2970 Gastritis, unspecified, without bleeding: Secondary | ICD-10-CM

## 2017-11-23 DIAGNOSIS — L501 Idiopathic urticaria: Secondary | ICD-10-CM

## 2017-11-23 DIAGNOSIS — T7800XD Anaphylactic reaction due to unspecified food, subsequent encounter: Secondary | ICD-10-CM

## 2017-11-23 DIAGNOSIS — J3089 Other allergic rhinitis: Secondary | ICD-10-CM

## 2017-11-23 MED ORDER — BUDESONIDE-FORMOTEROL FUMARATE 160-4.5 MCG/ACT IN AERO: 2.0000 | INHALATION_SPRAY | Freq: Two times a day (BID) | RESPIRATORY_TRACT | 5 refills | Status: DC

## 2017-11-23 MED ORDER — ALBUTEROL SULFATE HFA 108 (90 BASE) MCG/ACT IN AERS: INHALATION_SPRAY | RESPIRATORY_TRACT | 1 refills | Status: DC

## 2017-11-23 MED ORDER — CETIRIZINE HCL 10 MG PO TABS: 10.0000 mg | ORAL_TABLET | Freq: Every day | ORAL | 5 refills | Status: DC

## 2017-11-23 MED ORDER — RANITIDINE HCL 150 MG PO TABS: 150.0000 mg | ORAL_TABLET | Freq: Two times a day (BID) | ORAL | 0 refills | Status: DC

## 2017-11-23 MED ORDER — MONTELUKAST SODIUM 10 MG PO TABS: 10.0000 mg | ORAL_TABLET | Freq: Every day | ORAL | 5 refills | Status: DC

## 2017-11-23 MED ORDER — EPINEPHRINE 0.3 MG/0.3ML IJ SOAJ: INTRAMUSCULAR | 1 refills | Status: DC

## 2017-11-23 NOTE — Telephone Encounter (Signed)
Patient was recently seen and was told to take certain medications  the scripts werent sent into the pharmacy - patient went to get meds, and pharmacy says they never received their original scripts.  Patient uses [facility] on [id number]

## 2017-11-23 NOTE — Telephone Encounter (Signed)
Prescriptions have been sent in.  °

## 2017-11-24 ENCOUNTER — Other Ambulatory Visit: Payer: Self-pay | Admitting: Allergy

## 2017-11-24 DIAGNOSIS — J4540 Moderate persistent asthma, uncomplicated: Secondary | ICD-10-CM

## 2017-11-24 MED ORDER — ALBUTEROL SULFATE HFA 108 (90 BASE) MCG/ACT IN AERS: INHALATION_SPRAY | RESPIRATORY_TRACT | 1 refills | Status: DC

## 2017-11-24 NOTE — Telephone Encounter (Signed)
Patient called yesterday requesting Ventolin and pharmacy states they did not receive from us. Patient called again today about this; I looked in her chart and it showed it was received. Pharmacy is correct. Could it be resent?

## 2017-11-24 NOTE — Telephone Encounter (Signed)
Spoke with pharmacy and they claim they did not receive rx of albuterol. Patient is needing her medication so I sent a new Rx even though the records stated they did received the medication on 11/23/17 at 12 noon.

## 2017-11-25 NOTE — Telephone Encounter (Signed)
Pt was previously on symbicort with good response and ran out.  Needs refill of symbicort.

## 2017-11-25 NOTE — Telephone Encounter (Signed)
Please recommend alternative medication

## 2017-12-15 ENCOUNTER — Other Ambulatory Visit: Payer: Self-pay | Admitting: Allergy

## 2017-12-15 DIAGNOSIS — L501 Idiopathic urticaria: Secondary | ICD-10-CM

## 2017-12-15 DIAGNOSIS — K2970 Gastritis, unspecified, without bleeding: Secondary | ICD-10-CM

## 2018-05-23 ENCOUNTER — Telehealth: Payer: Self-pay

## 2018-05-23 NOTE — Telephone Encounter (Signed)
Patient doesn't have any insurance at this time. She is wondering if we have any Samples of Symbicort and if she could have a baby pack.

## 2018-05-23 NOTE — Telephone Encounter (Signed)
Dr Delorse Lek please advise on baby pack. Samples of Symbicort 160 are available for patient

## 2018-05-24 NOTE — Telephone Encounter (Signed)
Spoke to pt and she stated she has been using her pro-air 2-3 times a day, her symptoms are congestion, coughing up mucus, she hasn't been on her treatment for a long time. She is completely out of Symbicort, I told her she can pick up samples from the [place] office. I spoke to [facility] and she put 2 samples at the front desk for her.

## 2018-05-24 NOTE — Telephone Encounter (Signed)
She is due for an appt.  Does she have one upcoming? Is she having symptoms that would warrant prednisone?  What symptoms is she reporting.   Is she completely out of her Symbicort? Is that leading to increased symptoms?    Please provide with symbicort sample at this time but I need more information regarding need for prednisone.

## 2018-05-24 NOTE — Telephone Encounter (Signed)
Left detailed vm with information advising that samples have been placed up front. Requested a call back to schedule an appointment fr mid April to early may.

## 2018-05-24 NOTE — Telephone Encounter (Signed)
Ok if she is using albuterol that frequently she likely would benefit from prednisone as she has been off all inhaled steroid medications for some time.  Please leave her a Tuesday prednisone pack with the Symbicort.   Please ensure she has an appt down for at least April/May

## 2018-06-01 ENCOUNTER — Telehealth: Payer: Self-pay | Admitting: Allergy

## 2018-06-01 NOTE — Telephone Encounter (Signed)
She should perform standard hand hygiene and follow recommendation per [facility] and the [place] health department.   If she has a work-from-home option that would be recommended to decrease her risks of exposure and potentially getting infected.  With asthma she would be at increased risk of having more severe illness if infected but she is not at an increased risk of getting infected because she has asthma.    Can provide with the letter below she can present to her employer and determine if this is a suitable option for her job.    -------------------  To Whom It May Concern:  This letter is regarding my patient - Debra Guerrero. She has asthma, and with the current COVID-19 pandemic, having asthma puts her at higher risk of having complications such as asthma flares (which may be potentially life-threatening) if she is infected. Besides the precautions that are already recommended by the [facility] of Health and the Centers for Disease Control and Prevention, I also advise that she be allowed to work from home, which would decrease the risk of infection. Currently, we do not know the length of these restrictions, but when we are notified by national, state, and local health departments that it is safe to resume normal work activity, she may resume normal work activity as well.   Thank you for your consideration and attention to this matter, and thank you also for helping [place] to stay safe by following the recommendations.   Sincerely,  Margo Aye, MD [facility]

## 2018-06-01 NOTE — Telephone Encounter (Signed)
Would like to know if she needs to quarantine because she has asthma.

## 2018-06-01 NOTE — Telephone Encounter (Signed)
Called and informed patient. Pt verbalized understanding. Letter has been printed and e-mailed to the patient per patient's request.

## 2018-06-01 NOTE — Telephone Encounter (Signed)
Dr. Delorse Lek please advise with a work from letter.

## 2018-06-15 ENCOUNTER — Other Ambulatory Visit: Payer: Self-pay

## 2018-06-15 ENCOUNTER — Ambulatory Visit (INDEPENDENT_AMBULATORY_CARE_PROVIDER_SITE_OTHER): Payer: Medicaid Other | Admitting: Allergy

## 2018-06-15 ENCOUNTER — Encounter: Payer: Self-pay | Admitting: Allergy

## 2018-06-15 DIAGNOSIS — J4540 Moderate persistent asthma, uncomplicated: Secondary | ICD-10-CM

## 2018-06-15 DIAGNOSIS — T7800XD Anaphylactic reaction due to unspecified food, subsequent encounter: Secondary | ICD-10-CM

## 2018-06-15 DIAGNOSIS — J3089 Other allergic rhinitis: Secondary | ICD-10-CM

## 2018-06-15 MED ORDER — CETIRIZINE HCL 10 MG PO TABS: 10.0000 mg | ORAL_TABLET | Freq: Every day | ORAL | 5 refills | Status: DC

## 2018-06-15 MED ORDER — AZELASTINE HCL 0.1 % NA SOLN: 2.0000 | Freq: Two times a day (BID) | NASAL | 5 refills | Status: DC

## 2018-06-15 MED ORDER — ALBUTEROL SULFATE HFA 108 (90 BASE) MCG/ACT IN AERS: INHALATION_SPRAY | RESPIRATORY_TRACT | 1 refills | Status: DC

## 2018-06-15 MED ORDER — MONTELUKAST SODIUM 10 MG PO TABS: 10.0000 mg | ORAL_TABLET | Freq: Every day | ORAL | 5 refills | Status: DC

## 2018-06-15 MED ORDER — BUDESONIDE-FORMOTEROL FUMARATE 160-4.5 MCG/ACT IN AERO: 2.0000 | INHALATION_SPRAY | Freq: Two times a day (BID) | RESPIRATORY_TRACT | 5 refills | Status: DC

## 2018-06-15 NOTE — Progress Notes (Signed)
RE: Debra Guerrero MRN: [id number] DOB: 05/11/1985 Date of Telemedicine Visit: 06/15/2018  Referring provider: Tracey HarriesBouska, David, MD Primary care provider: Tracey HarriesBouska, David, MD  Chief Complaint: Asthma (coughing and SOB); Allergic Rhinitis  (feels like regimen is working); and Food Intolerance (shellfish no accidental exposures))   Telemedicine Follow Up Visit via WebEx: I connected with Debra Guerrero for a follow up on 06/15/18 by WebEx and verified that I am speaking with the correct person using two identifiers.   I discussed the limitations, risks, security and privacy concerns of performing an evaluation and management service by telemedicine and the availability of in person appointments. I also discussed with the patient that there may be a patient responsible charge related to this service. The patient expressed understanding and agreed to proceed.  Patient is at home. Provider is at the office.  Visit start time: 1425 Visit end time: [id number] Insurance consent/check in by: Marlene BastMarie C Medical consent and medical assistant/nurse: Darreld Mcleanrina S  History of Present Illness: She is a 33 y.o. female, who is being followed for asthma, allergic rhinitis and shellfish allergy. Her previous allergy office visit was on 10/20/17 with Dr. Delorse LekPadgett.   She denies any major health changes, surgeries or hospitalizations since her last visit.  About a week or two ago she called in with increase in asthma symptoms with cough, wheezing, SOB and needing to use albuterol 2-3 times a day.  She ran out of her symbicort and is self-pay at this time and thus we provided her with a 90month sample of symbicort and she states she has used one of the samples and is on the second sample.  She states her symptoms are improved since being back on symbicort.  We also provided with a prednisone pack but she states she forgot to complete the pack and has about 6-7 pills left.  She states she has not needed to use her albuterol with use  of symbicort.  She also states she would like to go back on singulair.  Her main concern today is she reports when she drinks water she seems to get choked up which then leads her to cough and that she will end up with difficulty breathing and wheezing and has needed to use her albuterol.  She states this does not really happen with solid foods.  She also reports that she is clearing her throat all throughout the day.   She is not using any nasal sprays consistently at this time.  She does report taking zyrtec.  She is taking zantac for reflux control.     Assessment and Plan: Debra Guerrero is a 33 y.o. female with:  1. Moderate persistent asthma  - Daily controller medication(s): Singulair 10mg  daily and Symbicort 160/4.65mcg two puffs twice daily with spacer - Prior to physical activity: Ventolin 2 puffs 10-15 minutes before physical activity. - Rescue medications: Ventolin 4 puffs every 4-6 hours as needed - advise since symptoms have improved to not take any more prednisone at this time - I do believe that choking issues with liquids is tied to post-nasal drip leading to throat irritation of the vocal cords as the liquid tries to move the drainage and this is likely causing increase respiratory symptoms.  Will treat post-nasal drip better as below.   - Asthma control goals:  * Full participation in all desired activities (may need albuterol before activity) * Albuterol use two time or less a week on average (not counting use with activity) * Cough interfering with  sleep two time or less a month * Oral steroids no more than once a year * No hospitalizations  2. Seasonal and perennial allergic rhinitis - continue avoidance measures for dust mites, grasses, trees, weeds, cockroach, cat, dog, mold, mouse  - Continue with fluticasone nasal spray 1-2 sprays per nostril daily as needed for nasal congestion.  Use for 1-2 weeks at a time before stopping for maximum effect - for control of post-nasal drip  recommend starting nasal antihistamine, Astelin 2 sprays each nostril twice a day - recommend use of nasal saline rinse prior to use of nasal sprays to help flush/clean the nose and sinuses - Continue with zyrtec 10mg  daily. - Continue with montelukast 10mg  daily.    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 - will plan to test at last visit - have access to self-injectable epinephrine (Epipen or AuviQ) 0.3mg  at all times - follow emergency action plan in case of allergic reaction   4. Return in about 3-4 months or sooner if needed  Diagnostics: Component     Latest Ref Rng & Units 10/20/2017  WBC     3.4 - 10.8 x10E3/uL 16.9 (H)  RBC     3.77 - 5.28 x10E6/uL 4.80  Hemoglobin     11.1 - 15.9 g/dL 57.8  HCT     46.9 - 62.9 % 41.7  MCV     79 - 97 fL 87  MCH     26.6 - 33.0 pg 27.7  MCHC     31.5 - 35.7 g/dL 52.8  RDW     41.3 - 24.4 % 13.6  Platelets     150 - 450 x10E3/uL 316  Neutrophils     Not Estab. % 75  Lymphs     Not Estab. % 21  Monocytes     Not Estab. % 3  Eos     Not Estab. % 1  Basos     Not Estab. % 0  NEUT#     1.4 - 7.0 x10E3/uL 12.6 (H)  Lymphocyte #     0.7 - 3.1 x10E3/uL 3.6 (H)  Monocytes Absolute     0.1 - 0.9 x10E3/uL 0.4  EOS (ABSOLUTE)     0.0 - 0.4 x10E3/uL 0.2  Basophils Absolute     0.0 - 0.2 x10E3/uL 0.0  Immature Granulocytes     Not Estab. % 0  Immature Grans (Abs)     0.0 - 0.1 x10E3/uL 0.0   Component     Latest Ref Rng & Units 10/20/2017  IgE (Immunoglobulin E), Serum     6 - 495 IU/mL 484  D Pteronyssinus IgE     Class V kU/L 48.10 (A)  D Farinae IgE     Class V kU/L 43.40 (A)  Cat Dander IgE     Class III kU/L 3.68 (A)  Dog Dander IgE     Class III kU/L 1.82 (A)  [place] Grass IgE     Class IV kU/L 11.50 (A)  Timothy Grass IgE     Class IV kU/L 14.70 (A)  [place] Grass IgE     Class IV kU/L 9.86 (A)  Cockroach, German IgE     Class IV kU/L 7.09 (A)  Penicillium  Chrysogen IgE     Class 0/I kU/L 0.30 (A)  Cladosporium Herbarum IgE     Class 0/I kU/L 0.27 (A)  Aspergillus Fumigatus IgE     Class II kU/L 0.71 (A)  Alternaria Alternata IgE     Class III kU/L 3.26 (A)  Maple/Box Elder IgE     Class IV kU/L 10.90 (A)  Common Silver Charletta Cousin IgE     Class IV kU/L 13.80 (A)  Cedar, Mountain IgE     Class IV kU/L 11.60 (A)  Oak, White IgE     Class IV kU/L 15.00 (A)  Elm, American IgE     Class IV kU/L 11.70 (A)  Cottonwood IgE     Class IV kU/L 8.11 (A)  Pecan, Hickory IgE     Class IV kU/L 14.10 (A)  White Mulberry IgE     Class IV kU/L 7.02 (A)  Ragweed, Short IgE     Class IV kU/L 8.93 (A)  Pigweed, Rough IgE     Class IV kU/L 8.14 (A)  Sheep Sorrel IgE Qn     Class IV kU/L 7.76 (A)  Mouse Urine IgE     Class 0/I kU/L 0.12 (A)    Medication List:  Current Outpatient Medications  Medication Sig Dispense Refill   albuterol (PROVENTIL HFA;VENTOLIN HFA) 108 (90 Base) MCG/ACT inhaler 1-2 puffs by mouth every 4-6 hours as needed. 1 Inhaler 1   cetirizine (ZYRTEC) 10 MG tablet Take 1 tablet (10 mg total) by mouth daily. 30 tablet 5   EPINEPHrine 0.3 mg/0.3 mL IJ SOAJ injection Use as directed for severe allergic reaction 2 Device 1   ibuprofen (ADVIL,MOTRIN) 800 MG tablet Take 1 tablet (800 mg total) by mouth 3 (three) times daily. 21 tablet 0   montelukast (SINGULAIR) 10 MG tablet Take 1 tablet (10 mg total) by mouth at bedtime. 30 tablet 5   mupirocin ointment (BACTROBAN) 2 % APPLY TO AFFECTED AREA 3 TIMES DAILY     naproxen (NAPROSYN) 500 MG tablet Take 500 mg by mouth 2 (two) times daily with a meal.  0   ranitidine (ZANTAC) 150 MG tablet Take 1 tablet (150 mg total) by mouth 2 (two) times daily. 60 tablet 5   SYMBICORT 160-4.5 MCG/ACT inhaler TAKE 2 PUFFS BY MOUTH TWICE A DAY*NOT COVERED* 10.2 Inhaler 5   TRI-LO-MARZIA 0.18/0.215/0.25 MG-25 MCG tab Take 1 tablet by mouth daily.  6   No current facility-administered medications  for this visit.    Allergies: Allergies  Allergen Reactions   Shellfish Allergy Hives and Shortness Of Breath   I reviewed her past medical history, social history, family history, and environmental history and no significant changes have been reported from previous visit on 10/20/17.  Review of Systems  Constitutional: Negative for chills and fever.  HENT: Positive for postnasal drip, rhinorrhea and trouble swallowing. Negative for congestion, sneezing and sore throat.   Eyes: Negative for pain, discharge and itching.  Respiratory: Positive for cough, choking, shortness of breath and wheezing.   Cardiovascular: Negative.   Gastrointestinal: Negative for abdominal pain, constipation, diarrhea, nausea and vomiting.  Musculoskeletal: Negative for myalgias.  Skin: Negative for rash.  Neurological: Negative for headaches.   Objective: Physical Exam Not obtained as encounter was done via WebEx.  Previous notes and tests were reviewed.  I discussed the assessment and treatment plan with the patient. The patient was provided an opportunity to ask questions and all were answered. The patient agreed with the plan and demonstrated an understanding of the instructions.   The patient was advised to call back or seek an in-person evaluation if the symptoms worsen or if the condition fails to improve as anticipated.  I provided 34 minutes  of video-face-to-face time during this encounter.  It was my pleasure to participate in Debra Guerrero's care today. Please feel free to contact me with any questions or concerns.   Sincerely,  Charmion Hapke Larose Hires, MD

## 2018-06-15 NOTE — Patient Instructions (Addendum)
1. Moderate persistent asthma  - Daily controller medication(s): Singulair 10mg  daily and Symbicort 160/4.30mcg two puffs twice daily with spacer - Prior to physical activity: Ventolin 2 puffs 10-15 minutes before physical activity. - Rescue medications: Ventolin 4 puffs every 4-6 hours as needed - advise since symptoms have improved to not take any more prednisone at this time - I do believe that choking issues with liquids is tied to post-nasal drip leading to throat irritation of the vocal cords as the liquid tries to move the drainage and this is likely causing increase respiratory symptoms.  Will treat post-nasal drip better as below.   - Asthma control goals:  * Full participation in all desired activities (may need albuterol before activity) * Albuterol use two time or less a week on average (not counting use with activity) * Cough interfering with sleep two time or less a month * Oral steroids no more than once a year * No hospitalizations  2. Seasonal and perennial allergic rhinitis - continue avoidance measures for dust mites, grasses, trees, weeds, cockroach, cat, dog, mold, mouse  - Continue with fluticasone nasal spray 1-2 sprays per nostril daily as needed for nasal congestion.  Use for 1-2 weeks at a time before stopping for maximum effect - for control of post-nasal drip recommend starting nasal antihistamine, Astelin 2 sprays each nostril twice a day - recommend use of nasal saline rinse prior to use of nasal sprays to help flush/clean the nose and sinuses - Continue with zyrtec 10mg  daily. - Continue with montelukast 10mg  daily.    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 - will plan to test at last visit - have access to self-injectable epinephrine (Epipen or AuviQ) 0.3mg  at all times - follow emergency action plan in case of allergic reaction   4. Return in about 3-4 months or sooner if needed

## 2018-06-15 NOTE — Progress Notes (Signed)
Start time:  59 Finish Time:  1459 Where are you located:  home Do you give [place] permission to bill your insurance:  Cash pay patient Are you signed up for my chart:  yes  Pt being seen today for asthma, allergic rhinitis and food intolerance.   No accidental food exposures. Pt is c/o headaches and some coughing, SOB.  Feels like allergy regimen is working for her.  No runny nose. Having issues with drinking water, feels like she is choking and it makes her feel like she is having an asthma flare.

## 2018-07-28 ENCOUNTER — Other Ambulatory Visit: Payer: Self-pay

## 2018-07-28 MED ORDER — BUDESONIDE-FORMOTEROL FUMARATE 160-4.5 MCG/ACT IN AERO: 2.0000 | INHALATION_SPRAY | Freq: Two times a day (BID) | RESPIRATORY_TRACT | 3 refills | Status: DC

## 2018-07-28 NOTE — Telephone Encounter (Signed)
[employer] are wanting a written Rx script sent to them to help cover patient's Symbicort. Please advise and fax to [phone], please place  Patient ID# [id number].

## 2018-08-02 ENCOUNTER — Telehealth: Payer: Self-pay | Admitting: Diagnostic Neuroimaging

## 2018-08-02 NOTE — Addendum Note (Signed)
Addended by: Osa Craver on: 08/02/2018 04:58 PM   Modules accepted: Orders

## 2018-08-02 NOTE — Telephone Encounter (Signed)
Pt gave consent for VV on the phone/ Pt understands that although there may be some limitations with this type of visit, we will take all precautions to reduce any security or privacy concerns.  Pt understands that this will be treated like an in office visit and we will file with pt's insurance, and there may be a patient responsible charge related to this service. Sent e-mail to pt with provider's link [email or web address]

## 2018-08-02 NOTE — Telephone Encounter (Signed)
Medication went for 30.6 inhalers instead of 30.6 g. [employer] needs a new Rx for just 3 inhalers with refills. Please advise and sign.

## 2018-08-03 ENCOUNTER — Encounter: Payer: Self-pay | Admitting: Diagnostic Neuroimaging

## 2018-08-03 MED ORDER — BUDESONIDE-FORMOTEROL FUMARATE 160-4.5 MCG/ACT IN AERO
2.0000 | INHALATION_SPRAY | Freq: Two times a day (BID) | RESPIRATORY_TRACT | 2 refills | Status: DC
Start: 2018-08-03 — End: 2019-03-08

## 2018-08-03 NOTE — Telephone Encounter (Signed)
Spoke with patient and updated EMR. 

## 2018-08-08 ENCOUNTER — Ambulatory Visit (INDEPENDENT_AMBULATORY_CARE_PROVIDER_SITE_OTHER): Payer: Medicaid Other | Admitting: Diagnostic Neuroimaging

## 2018-08-08 ENCOUNTER — Other Ambulatory Visit: Payer: Self-pay

## 2018-08-08 ENCOUNTER — Encounter: Payer: Self-pay | Admitting: Diagnostic Neuroimaging

## 2018-08-08 DIAGNOSIS — H4600 Optic papillitis, unspecified eye: Secondary | ICD-10-CM

## 2018-08-08 NOTE — Progress Notes (Signed)
[facility]  PATIENT: Debra Guerrero DOB: 01/27/1986  REFERRING CLINICIAN: G Thurmond HISTORY FROM: patient  REASON FOR VISIT: new consult    HISTORICAL  CHIEF COMPLAINT:  Chief Complaint  Patient presents with  . Other    optic papillitis    HISTORY OF PRESENT ILLNESS:   33 year old female here for evaluation of optic papillitis.  Patient denies any unilateral blurred vision, loss of vision, eye pain.  Patient went to optometrist for routine annual eye exam.  Apparently she had some type of "abnormality" of 1 of her eyes.  Patient does not remember which eye was affected.  Referral was placed by optometrist for "optic papillitis".  Patient does have history of some mild headaches.  No numbness or tingling.  No slurred speech or trouble talking.  No facial pain or numbness.   REVIEW OF SYSTEMS: Full 14 system review of systems performed and negative with exception of: As per HPI.  ALLERGIES: Allergies  Allergen Reactions  . Shellfish Allergy Hives and Shortness Of Breath    HOME MEDICATIONS: Outpatient Medications Prior to Visit  Medication Sig Dispense Refill  . albuterol (PROVENTIL HFA;VENTOLIN HFA) 108 (90 Base) MCG/ACT inhaler 1-2 puffs by mouth every 4-6 hours as needed. 1 Inhaler 1  . azelastine (ASTELIN) 0.1 % nasal spray Place 2 sprays into both nostrils 2 (two) times daily. Use in each nostril as directed 30 mL 5  . budesonide-formoterol (SYMBICORT) 160-4.5 MCG/ACT inhaler Inhale 2 puffs into the lungs 2 (two) times daily. 3 Inhaler 2  . cetirizine (ZYRTEC) 10 MG tablet Take 1 tablet (10 mg total) by mouth daily. 30 tablet 5  . Chlorhexidine Gluconate 2 % SOLN Use wash once daily for 1 week and then twice a week after.    Marland Kitchen. EPINEPHrine 0.3 mg/0.3 mL IJ SOAJ injection Use as directed for severe allergic reaction 2 Device 1  . ibuprofen (ADVIL,MOTRIN) 800 MG tablet Take 1 tablet (800 mg total) by mouth 3 (three) times daily. 21 tablet 0  .  montelukast (SINGULAIR) 10 MG tablet Take 1 tablet (10 mg total) by mouth at bedtime. 30 tablet 5  . mupirocin ointment (BACTROBAN) 2 % APPLY TO AFFECTED AREA 3 TIMES DAILY    . naproxen (NAPROSYN) 500 MG tablet Take 500 mg by mouth 2 (two) times daily with a meal.  0  . ranitidine (ZANTAC) 150 MG tablet Take 1 tablet (150 mg total) by mouth 2 (two) times daily. 60 tablet 5   No facility-administered medications prior to visit.     PAST MEDICAL HISTORY: Past Medical History:  Diagnosis Date  . Asthma   . Depression   . Headache(784.0)   . 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 Seasonal allergies 09/11/2011    PAST SURGICAL HISTORY: Past Surgical History:  Procedure Laterality Date  . NO PAST SURGERIES      FAMILY HISTORY: Family History  Problem Relation Age of Onset  . Asthma Mother   . Asthma Father   . Eczema Sister   . Allergic rhinitis Neg Hx   . Angioedema Neg Hx   . Atopy Neg Hx   . Immunodeficiency Neg Hx   . Urticaria Neg Hx     SOCIAL HISTORY: Social History   Socioeconomic History  . Marital status: Divorced    Spouse name: Not on file  . Number of children: 1  . Years of education: Not on file  . Highest education level: Associate degree: academic program  Occupational History  .  Not on file  Social Needs  . Financial resource strain: Not on file  . Food insecurity:    Worry: Not on file    Inability: Not on file  . Transportation needs:    Medical: Not on file    Non-medical: Not on file  Tobacco Use  . Smoking status: Former Smoker    Types: Cigarettes  . Smokeless tobacco: Never Used  Substance and Sexual Activity  . Alcohol use: No  . Drug use: No  . Sexual activity: Yes    Birth control/protection: None  Lifestyle  . Physical activity:    Days per week: Not on file    Minutes per session: Not on file  . Stress: Not on file  Relationships  . Social connections:    Talks on phone: Not on file    Gets together: Not on file     Attends religious service: Not on file    Active member of club or organization: Not on file    Attends meetings of clubs or organizations: Not on file    Relationship status: Not on file  . Intimate partner violence:    Fear of current or ex partner: Not on file    Emotionally abused: Not on file    Physically abused: Not on file    Forced sexual activity: Not on file  Other Topics Concern  . Not on file  Social History Narrative   Lives with child   Caffeine- hardly any     PHYSICAL EXAM   VIDEO EXAM  GENERAL EXAM/CONSTITUTIONAL:  Vitals: There were no vitals filed for this visit.  There is no height or weight on file to calculate BMI. Wt Readings from Last 3 Encounters:  10/20/17 230 lb (104.3 kg)  08/29/17 230 lb (104.3 kg)  05/05/17 230 lb (104.3 kg)     Patient is in no distress; well developed, nourished and groomed; neck is supple   NEUROLOGIC: MENTAL STATUS:  No flowsheet data found.  awake, alert, oriented to person, place and time  recent and remote memory intact  normal attention and concentration  language fluent, comprehension intact, naming intact  fund of knowledge appropriate  CRANIAL NERVE:   2nd, 3rd, 4th, 6th - visual fields full to confrontation, extraocular muscles intact, no nystagmus  5th - facial sensation symmetric  7th - facial strength symmetric  8th - hearing intact  11th - shoulder shrug symmetric  12th - tongue protrusion midline  MOTOR:   NO TREMOR; NO DRIFT IN BUE  SENSORY:   normal and symmetric to light touch  COORDINATION:   fine finger movements normal     DIAGNOSTIC DATA (LABS, IMAGING, TESTING) - I reviewed patient records, labs, notes, testing and imaging myself where available.  Lab Results  Component Value Date   WBC 16.9 (H) 10/20/2017   HGB 13.3 10/20/2017   HCT 41.7 10/20/2017   MCV 87 10/20/2017   PLT 316 10/20/2017      Component Value Date/Time   NA 139 04/15/2015 1329   K  4.3 04/15/2015 1329   CL 100 04/15/2015 1329   CO2 25 04/15/2015 1329   GLUCOSE 70 04/15/2015 1329   BUN 10 04/15/2015 1329   CREATININE 0.56 04/15/2015 1329   CALCIUM 9.9 04/15/2015 1329   PROT 7.2 04/15/2015 1329   ALBUMIN 4.1 04/15/2015 1329   AST 15 04/15/2015 1329   ALT 14 04/15/2015 1329   ALKPHOS 80 04/15/2015 1329   BILITOT 0.3 04/15/2015 1329  GFRNONAA >90 09/24/2011 0509   GFRAA >90 09/24/2011 0509   No results found for: CHOL, HDL, LDLCALC, LDLDIRECT, TRIG, CHOLHDL No results found for: YJEH6D Lab Results  Component Value Date   VITAMINB12 1,338 (H) 09/13/2011   Lab Results  Component Value Date   TSH 0.80 04/15/2015      ASSESSMENT AND PLAN  33 y.o. year old female here with:  Ddx: incidental, asymptomatic optic papillits (? Pseudo-papilledema vs papilledema vs optic neuritis; incidentally found by optometry)  1. Optic papillitis, unspecified laterality     \Virtual Visit via Video Note  I connected with Debra Guerrero on 08/08/18 at  4:00 PM EDT by a video enabled telemedicine application and verified that I am speaking with the correct person using two identifiers.  Location: Patient: home Provider: office   I discussed the limitations of evaluation and management by telemedicine and the availability of in person appointments. The patient expressed understanding and agreed to proceed.   I discussed the assessment and treatment plan with the patient. The patient was provided an opportunity to ask questions and all were answered. The patient agreed with the plan and demonstrated an understanding of the instructions.   The patient was advised to call back or seek an in-person evaluation if the symptoms worsen or if the condition fails to improve as anticipated.  I provided 30 minutes of non-face-to-face time during this encounter.    PLAN:  INCIDENTAL, ASYMPTOMATIC OPTIC PAPILLITIS (per optometry Dr. Santiago Bumpers) - refer to retina specialist (if  optic neuritis or papilledema confirmed, then will check MRI brain and orbits)  Orders Placed This Encounter  Procedures  . Ambulatory referral to Ophthalmology   Return for pending if symptoms worsen or fail to improve.    Suanne Marker, MD 08/08/2018, 4:19 PM Certified in Neurology, Neurophysiology and Neuroimaging  [facility] [address], [address] [place], [place] [address] [phone]

## 2018-10-12 ENCOUNTER — Other Ambulatory Visit: Payer: Self-pay

## 2018-10-12 MED ORDER — ALBUTEROL SULFATE HFA 108 (90 BASE) MCG/ACT IN AERS: 2.0000 | INHALATION_SPRAY | RESPIRATORY_TRACT | 1 refills | Status: DC | PRN

## 2018-10-12 NOTE — Telephone Encounter (Signed)
Refill Ventolin HFA x one.  Last OV 06/15/2018.

## 2018-10-27 ENCOUNTER — Other Ambulatory Visit: Payer: Self-pay

## 2018-10-27 ENCOUNTER — Encounter: Payer: Self-pay | Admitting: Allergy

## 2018-10-27 ENCOUNTER — Ambulatory Visit (INDEPENDENT_AMBULATORY_CARE_PROVIDER_SITE_OTHER): Payer: Medicaid Other | Admitting: Allergy

## 2018-10-27 VITALS — BP 92/62 | HR 87 | Temp 97.7°F | Resp 16 | Ht 65.0 in | Wt 232.2 lb

## 2018-10-27 DIAGNOSIS — J3089 Other allergic rhinitis: Secondary | ICD-10-CM | POA: Diagnosis not present

## 2018-10-27 DIAGNOSIS — J4540 Moderate persistent asthma, uncomplicated: Secondary | ICD-10-CM | POA: Diagnosis not present

## 2018-10-27 DIAGNOSIS — T7800XD Anaphylactic reaction due to unspecified food, subsequent encounter: Secondary | ICD-10-CM | POA: Diagnosis not present

## 2018-10-27 MED ORDER — ALBUTEROL SULFATE (2.5 MG/3ML) 0.083% IN NEBU: 2.5000 mg | INHALATION_SOLUTION | Freq: Four times a day (QID) | RESPIRATORY_TRACT | 12 refills | Status: DC | PRN

## 2018-10-27 NOTE — Progress Notes (Signed)
Follow-up Note  RE: Debra Guerrero MRN: [id number] DOB: 09/14/1985 Date of Office Visit: 10/27/2018   History of present illness: Debra Guerrero is a 33 y.o. female presenting today for sick visit.  She has history of asthma, allergic rhinitis and food allergy.  Her last visit was on June 15, 2018 by telemedicine with myself. She states that she is currently 5 months pregnant.  She states she usually takes walks in the morning for exercise.  However in the mornings over the last several week or so she has been having chest tightness, SOB.  She also feels like her heart is beating a bit faster.   She does not feel that her albuterol inhaler is working as effectively as it used to when she is symptomatic.  She saw her OB/Gyn about these symptoms and per patient reports symptoms were not pregnancy related.   She has been using her albuterol inhaler but does not feel it has been as effective.  Has used albuterol on 2 days this week.   She does not have any more vials to use her nebulizer at this moment.  She states she is using her Symbicort but only taking it at bedtime.  She states she forgets to use it in the mornings.  She is taking her Singulair daily. In regards to her allergies she states she is having some sneezing.  No significant nasal congestion or drainage at this time.  She is taking Zyrtec daily.  She states she does not have any no sprays at home at this time if she needed to use them. She continues to avoid shellfish and has access to an epinephrine device.  She was seen in the ED on September 25, 2018 for complaints of chest pain.  She had a EKG showing normal sinus rhythm and had normal oxygen saturation.  The ED had plans to perform further work-up however the patient did not want to wait and thus she left.     Review of systems: Review of Systems  Constitutional: Negative for chills, fever and malaise/fatigue.  HENT: Negative for congestion, ear discharge, nosebleeds, sinus  pain and sore throat.   Eyes: Negative for pain, discharge and redness.  Respiratory: Positive for shortness of breath. Negative for cough, sputum production and wheezing.   Cardiovascular: Positive for chest pain (tightness).  Gastrointestinal: Negative.   Musculoskeletal: Negative.   Skin: Negative.   Neurological: Negative.     All other systems negative unless noted above in HPI  Past medical/social/surgical/family history have been reviewed and are unchanged unless specifically indicated below.  No changes  Medication List: Allergies as of 10/27/2018      Reactions   Shellfish Allergy Hives, Shortness Of Breath      Medication List       Accurate as of October 27, 2018 12:05 PM. If you have any questions, ask your nurse or doctor.        albuterol 108 (90 Base) MCG/ACT inhaler Commonly known as: Ventolin HFA Inhale 2 puffs into the lungs every 4 (four) hours as needed for wheezing or shortness of breath. What changed: Another medication with the same name was added. Make sure you understand how and when to take each. Changed by: Shaylar Larose HiresPatricia Padgett, MD   albuterol (2.5 MG/3ML) 0.083% nebulizer solution Commonly known as: PROVENTIL Take 3 mLs (2.5 mg total) by nebulization every 6 (six) hours as needed for wheezing or shortness of breath. What changed: You were already taking a  medication with the same name, and this prescription was added. Make sure you understand how and when to take each. Changed by: Shaylar Charmian Muff, MD   azelastine 0.1 % nasal spray Commonly known as: ASTELIN Place 2 sprays into both nostrils 2 (two) times daily. Use in each nostril as directed   budesonide-formoterol 160-4.5 MCG/ACT inhaler Commonly known as: Symbicort Inhale 2 puffs into the lungs 2 (two) times daily.   cetirizine 10 MG tablet Commonly known as: ZYRTEC Take 1 tablet (10 mg total) by mouth daily.   Chlorhexidine Gluconate 2 % Soln Use wash once daily for 1  week and then twice a week after.   EPINEPHrine 0.3 mg/0.3 mL Soaj injection Commonly known as: EPI-PEN Use as directed for severe allergic reaction   ibuprofen 800 MG tablet Commonly known as: ADVIL Take 1 tablet (800 mg total) by mouth 3 (three) times daily.   montelukast 10 MG tablet Commonly known as: SINGULAIR Take 1 tablet (10 mg total) by mouth at bedtime.   mupirocin ointment 2 % Commonly known as: BACTROBAN APPLY TO AFFECTED AREA 3 TIMES DAILY   naproxen 500 MG tablet Commonly known as: NAPROSYN Take 500 mg by mouth 2 (two) times daily with a meal.   ranitidine 150 MG tablet Commonly known as: ZANTAC Take 1 tablet (150 mg total) by mouth 2 (two) times daily.       Known medication allergies: Allergies  Allergen Reactions  . Shellfish Allergy Hives and Shortness Of Breath     Physical examination: Blood pressure 92/62, pulse 87, temperature 97.7 F (36.5 C), temperature source Temporal, resp. rate 16, height 5\' 5"  (1.651 m), weight 232 lb 3.2 oz (105.3 kg), SpO2 100 %.  General: Alert, interactive, in no acute distress. HEENT: PERRLA, TMs pearly gray, turbinates mildly edematous with clear discharge, post-pharynx non erythematous. Neck: Supple without lymphadenopathy. Lungs: Clear to auscultation without wheezing, rhonchi or rales. {no increased work of breathing. CV: Normal S1, S2 without murmurs. Abdomen: Nondistended, nontender. Skin: Warm and dry, without lesions or rashes. Extremities:  No clubbing, cyanosis or edema. Neuro:   Grossly intact.  Diagnositics/Labs: Spirometry: FEV1: 2.4L 86%, FVC: 3.07L 93%, ratio consistent with nonobstructive pattern  Assessment and plan:   1. Moderate persistent asthma with acute exacerbation - Daily controller medication(s): Singulair 10mg  daily and Symbicort 160/4.32mcg two puffs twice daily with spacer - Prior to physical activity: Ventolin 2 puffs 10-15 minutes before physical activity. - Rescue medications:  Ventolin 4 puffs or albuterol 1 vial in nebulizer every 4-6 hours as needed - will provide with prednisone 20mg  daily x 5 day course to take if symptoms persists despite twice a day use of Symbicort - both Singulair and Symbicort are the asthma medications safe for use during pregnancy    - Asthma control goals:  * Full participation in all desired activities (may need albuterol before activity) * Albuterol use two time or less a week on average (not counting use with activity) * Cough interfering with sleep two time or less a month * Oral steroids no more than once a year * No hospitalizations  2. Seasonal and perennial allergic rhinitis - continue avoidance measures for dust mites, grasses, trees, weeds, cockroach, cat, dog, mold, mouse  - use Rhinocort nasal spray 1-2 sprays per nostril daily as needed for nasal congestion as needed.  Use for 1-2 weeks at a time before stopping for maximum effect - for control of post-nasal drip recommend starting nasal antihistamine, Astelin 2 sprays each  nostril twice a day - recommend use of nasal saline rinse prior to use of nasal sprays to help flush/clean the nose and sinuses - Continue with zyrtec 10mg  daily. - Continue with montelukast 10mg  daily.  - Rhinocort nasal spray as well as antihistamines Zyrtec, Xyzal and Claritin are safe for use during pregnancy  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 - have access to self-injectable epinephrine (Epipen or AuviQ) 0.3mg  at all times - follow emergency action plan in case of allergic reaction  4. Return in about 3-4 months or sooner if needed  I appreciate the opportunity to take part in Debra Guerrero's care. Please do not hesitate to contact me with questions.  Sincerely,   Margo AyeShaylar Padgett, MD Allergy/Immunology [facility]

## 2018-10-27 NOTE — Patient Instructions (Addendum)
1. Moderate persistent asthma with acute exacerbation - Daily controller medication(s): Singulair 10mg  daily and Symbicort 160/4.31mcg two puffs twice daily with spacer - Prior to physical activity: Ventolin 2 puffs 10-15 minutes before physical activity. - Rescue medications: Ventolin 4 puffs or albuterol 1 vial in nebulizer every 4-6 hours as needed - will provide with prednisone 20mg  daily x 5 day course to take if symptoms persists despite twice a day use of Symbicort - both Singulair and Symbicort are the asthma medications safe for use during pregnancy    - Asthma control goals:  * Full participation in all desired activities (may need albuterol before activity) * Albuterol use two time or less a week on average (not counting use with activity) * Cough interfering with sleep two time or less a month * Oral steroids no more than once a year * No hospitalizations  2. Seasonal and perennial allergic rhinitis - continue avoidance measures for dust mites, grasses, trees, weeds, cockroach, cat, dog, mold, mouse  - use Rhinocort nasal spray 1-2 sprays per nostril daily as needed for nasal congestion as needed.  Use for 1-2 weeks at a time before stopping for maximum effect - for control of post-nasal drip recommend starting nasal antihistamine, Astelin 2 sprays each nostril twice a day - recommend use of nasal saline rinse prior to use of nasal sprays to help flush/clean the nose and sinuses - Continue with zyrtec 10mg  daily. - Continue with montelukast 10mg  daily.  - Rhinocort nasal spray as well as antihistamines Zyrtec, Xyzal and Claritin are safe for use during pregnancy  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 - have access to self-injectable epinephrine (Epipen or AuviQ) 0.3mg  at all times - follow emergency action plan in case of allergic reaction   4. Return in about 3-4 months or sooner if needed

## 2018-11-16 ENCOUNTER — Ambulatory Visit: Payer: Medicaid Other | Admitting: Allergy

## 2019-01-08 ENCOUNTER — Other Ambulatory Visit: Payer: Self-pay | Admitting: Allergy

## 2019-01-08 DIAGNOSIS — J4540 Moderate persistent asthma, uncomplicated: Secondary | ICD-10-CM

## 2019-01-08 DIAGNOSIS — J3089 Other allergic rhinitis: Secondary | ICD-10-CM

## 2019-01-10 ENCOUNTER — Other Ambulatory Visit: Payer: Self-pay | Admitting: Allergy

## 2019-03-08 ENCOUNTER — Other Ambulatory Visit: Payer: Self-pay | Admitting: Allergy

## 2019-03-09 ENCOUNTER — Telehealth: Payer: Self-pay

## 2019-03-09 NOTE — Telephone Encounter (Signed)
Can we do a PA?   At last visit she was 5 months pregnant thus she should have delivered now however likely planning to breastfeed.   Symbicort is the preferred inhaler (if we can get covered) during pregnancy and breastfeeding.

## 2019-03-09 NOTE — Telephone Encounter (Signed)
PA for Symbicort has been initiated through [facility] and is currently suspended/ pending. Confirmation # is [id number].

## 2019-03-09 NOTE — Telephone Encounter (Signed)
pts insurance will not cover symbicort, but will cover dulera 200 is it ok to change?

## 2019-03-13 ENCOUNTER — Other Ambulatory Visit: Payer: Self-pay

## 2019-03-13 ENCOUNTER — Inpatient Hospital Stay (HOSPITAL_COMMUNITY)
Admission: AD | Admit: 2019-03-13 | Discharge: 2019-03-13 | Disposition: A | Discharge status: Home / Self Care | Payer: Medicaid Other | Attending: Obstetrics and Gynecology | Admitting: Obstetrics and Gynecology

## 2019-03-13 ENCOUNTER — Encounter (HOSPITAL_COMMUNITY): Payer: Self-pay | Admitting: Obstetrics and Gynecology

## 2019-03-13 DIAGNOSIS — O165 Unspecified maternal hypertension, complicating the puerperium: Secondary | ICD-10-CM

## 2019-03-13 DIAGNOSIS — N939 Abnormal uterine and vaginal bleeding, unspecified: Secondary | ICD-10-CM | POA: Diagnosis not present

## 2019-03-13 DIAGNOSIS — Z87891 Personal history of nicotine dependence: Secondary | ICD-10-CM | POA: Diagnosis not present

## 2019-03-13 DIAGNOSIS — Z791 Long term (current) use of non-steroidal anti-inflammatories (NSAID): Secondary | ICD-10-CM | POA: Insufficient documentation

## 2019-03-13 DIAGNOSIS — Z7951 Long term (current) use of inhaled steroids: Secondary | ICD-10-CM | POA: Diagnosis not present

## 2019-03-13 DIAGNOSIS — Z79899 Other long term (current) drug therapy: Secondary | ICD-10-CM | POA: Insufficient documentation

## 2019-03-13 DIAGNOSIS — O9953 Diseases of the respiratory system complicating the puerperium: Secondary | ICD-10-CM | POA: Diagnosis not present

## 2019-03-13 DIAGNOSIS — O139 Gestational [pregnancy-induced] hypertension without significant proteinuria, unspecified trimester: Secondary | ICD-10-CM

## 2019-03-13 DIAGNOSIS — O9089 Other complications of the puerperium, not elsewhere classified: Secondary | ICD-10-CM | POA: Insufficient documentation

## 2019-03-13 DIAGNOSIS — J45909 Unspecified asthma, uncomplicated: Secondary | ICD-10-CM | POA: Insufficient documentation

## 2019-03-13 DIAGNOSIS — R030 Elevated blood-pressure reading, without diagnosis of hypertension: Secondary | ICD-10-CM | POA: Insufficient documentation

## 2019-03-13 LAB — COMPREHENSIVE METABOLIC PANEL
ALT: 43 U/L (ref 0–44)
AST: 33 U/L (ref 15–41)
Albumin: 2.7 g/dL — ABNORMAL LOW (ref 3.5–5.0)
Alkaline Phosphatase: 130 U/L — ABNORMAL HIGH (ref 38–126)
Anion gap: 10 (ref 5–15)
BUN: 9 mg/dL (ref 6–20)
CO2: 23 mmol/L (ref 22–32)
Calcium: 9.1 mg/dL (ref 8.9–10.3)
Chloride: 104 mmol/L (ref 98–111)
Creatinine, Ser: 0.45 mg/dL (ref 0.44–1.00)
GFR calc Af Amer: >60 mL/min (ref >60)
GFR calc non Af Amer: >60 mL/min (ref >60)
Glucose, Bld: 103 mg/dL — ABNORMAL HIGH (ref 70–99)
Potassium: 3.8 mmol/L (ref 3.5–5.1)
Sodium: 137 mmol/L (ref 135–145)
Total Bilirubin: 0.3 mg/dL (ref 0.3–1.2)
Total Protein: 6.2 g/dL — ABNORMAL LOW (ref 6.5–8.1)

## 2019-03-13 LAB — PROTEIN / CREATININE RATIO, URINE
Creatinine, Urine: 20.95 mg/dL
Protein Creatinine Ratio: 0.33 mg/mg{Cre} — ABNORMAL HIGH (ref 0.00–0.15)
Total Protein, Urine: 7 mg/dL

## 2019-03-13 LAB — CBC
HCT: 33.7 % — ABNORMAL LOW (ref 36.0–46.0)
Hemoglobin: 10.6 g/dL — ABNORMAL LOW (ref 12.0–15.0)
MCH: 24.5 pg — ABNORMAL LOW (ref 26.0–34.0)
MCHC: 31.5 g/dL (ref 30.0–36.0)
MCV: 78 fL — ABNORMAL LOW (ref 80.0–100.0)
Platelets: 271 10*3/uL (ref 150–400)
RBC: 4.32 MIL/uL (ref 3.87–5.11)
RDW: 16.9 % — ABNORMAL HIGH (ref 11.5–15.5)
WBC: 10.8 10*3/uL — ABNORMAL HIGH (ref 4.0–10.5)
nRBC: 0 % (ref 0.0–0.2)

## 2019-03-13 MED ORDER — ENALAPRIL MALEATE 10 MG PO TABS: 10.0000 mg | ORAL_TABLET | Freq: Every day | ORAL | 11 refills | Status: DC

## 2019-03-13 MED ORDER — ENALAPRIL MALEATE 5 MG PO TABS
5.0000 mg | ORAL_TABLET | Freq: Once | ORAL | Status: AC
  Administered 2019-03-13: 5 mg via ORAL
  Filled 2019-03-13: qty 1

## 2019-03-13 NOTE — MAU Note (Signed)
Pt delivered vaginally Wednesday at [place]. Passed large clot/tissue around 11:30pm. States she is having vaginal bleeding. Reports she has not changed pad since she put one on around 11:50pm. Reports this was an uncomplicated delivery and pregnancy. Reports 911 was called but patient's husband dropped her off.

## 2019-03-13 NOTE — MAU Provider Note (Signed)
History     CSN: [id number]  Arrival date and time: 03/13/19 3382   First Provider Initiated Contact with Patient 03/13/19 0135      Chief Complaint  Patient presents with  . Postpartum Complications  . Vaginal Bleeding   HPI  Debra Guerrero is a 34 yo G2P2002 who is PPD#5 from a vaginal delivery at [facility] who presents to MAU complaining of passing a 7cm diameter clot around 11:30 PM. She had been sitting down for a few hours and noticed it when she went to the bathroom. She has not changed her pad since then, denies feeling lightheaded or dizzy. She reports that her lochia was minimal earlier today, previously she has had bleeding like a moderate-heavy period.   Her blood pressures were elevated on arrival. She denies history of high blood pressures before or during her pregnancy. She denies SOB, vision changes, RUQ pain. Other than the blood clot she passed, she feels fine.  OB History    Gravida  2   Para  2   Term  2   Preterm      AB      Living  2     SAB      TAB      Ectopic      Multiple      Live Births              Past Medical History:  Diagnosis Date  . Asthma   . Depression   . Headache(784.0)   . 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 Seasonal allergies 09/11/2011    Past Surgical History:  Procedure Laterality Date  . NO PAST SURGERIES      Family History  Problem Relation Age of Onset  . Asthma Mother   . Asthma Father   . Eczema Sister   . Allergic rhinitis Neg Hx   . Angioedema Neg Hx   . Atopy Neg Hx   . Immunodeficiency Neg Hx   . Urticaria Neg Hx     Social History   Tobacco Use  . Smoking status: Former Smoker    Types: Cigarettes  . Smokeless tobacco: Never Used  Substance Use Topics  . Alcohol use: No  . Drug use: No    Allergies:  Allergies  Allergen Reactions  . Shellfish Allergy Hives and Shortness Of Breath    Medications Prior to Admission  Medication Sig Dispense Refill Last Dose  . cetirizine  (ZYRTEC) 10 MG tablet TAKE 1 TABLET BY MOUTH EVERY DAY 30 tablet 4 03/12/2019 at Unknown time  . Chlorhexidine Gluconate 2 % SOLN Use wash once daily for 1 week and then twice a week after.   03/12/2019 at Unknown time  . montelukast (SINGULAIR) 10 MG tablet TAKE 1 TABLET BY MOUTH EVERYDAY AT BEDTIME 30 tablet 4 03/12/2019 at Unknown time  . mupirocin ointment (BACTROBAN) 2 % APPLY TO AFFECTED AREA 3 TIMES DAILY   03/12/2019 at Unknown time  . SYMBICORT 160-4.5 MCG/ACT inhaler TAKE 2 PUFFS BY MOUTH TWICE A DAY 10.2 Inhaler 5 03/12/2019 at Unknown time  . albuterol (PROVENTIL) (2.5 MG/3ML) 0.083% nebulizer solution Take 3 mLs (2.5 mg total) by nebulization every 6 (six) hours as needed for wheezing or shortness of breath. 75 mL 12   . albuterol (VENTOLIN HFA) 108 (90 Base) MCG/ACT inhaler Inhale 2 puffs into the lungs every 4 (four) hours as needed for wheezing or shortness of breath. 18 g 1   . azelastine (ASTELIN) 0.1 %  nasal spray Place 2 sprays into both nostrils 2 (two) times daily. Use in each nostril as directed 30 mL 5   . EPINEPHrine 0.3 mg/0.3 mL IJ SOAJ injection Use as directed for severe allergic reaction 2 Device 1   . ibuprofen (ADVIL,MOTRIN) 800 MG tablet Take 1 tablet (800 mg total) by mouth 3 (three) times daily. 21 tablet 0   . naproxen (NAPROSYN) 500 MG tablet Take 500 mg by mouth 2 (two) times daily with a meal.  0   . ranitidine (ZANTAC) 150 MG tablet Take 1 tablet (150 mg total) by mouth 2 (two) times daily. 60 tablet 5     Review of Systems  All other systems reviewed and are negative.  Physical Exam   Blood pressure (!) 146/93, pulse 90, temperature 98.2 F (36.8 C), temperature source Oral, resp. rate 18, height 5\' 5"  (1.651 m), weight 107.6 kg, SpO2 99 %, unknown if currently breastfeeding.  Physical Exam  Nursing note and vitals reviewed. Constitutional: She is oriented to person, place, and time. She appears well-developed and well-nourished.  obese  HENT:  Head:  Normocephalic and atraumatic.  Eyes: Pupils are equal, round, and reactive to light. Conjunctivae are normal.  Cardiovascular: Normal rate, regular rhythm, normal heart sounds and intact distal pulses.  Respiratory: Effort normal and breath sounds normal.  GI:  Fundus firm at 3-U. Minimal lochia expressed with firm fundal rub  Genitourinary:    Genitourinary Comments: Small dark maroon clot in vaginal vault. No bright red blood or active bleeding   Musculoskeletal:        General: Normal range of motion.     Cervical back: Normal range of motion and neck supple.  Neurological: She is alert and oriented to person, place, and time. She has normal reflexes.  Skin: Skin is warm and dry.  Psychiatric: She has a normal mood and affect. Her behavior is normal. Judgment and thought content normal.    MAU Course  Procedures  MDM -Lochia appropriate on exam -Elevated BPs, per patient there were no issues with her pregnancy, CareEverywhere reviewed and patient did have GDM and Hx of syphilis  Results for orders placed or performed during the hospital encounter of 03/13/19 (from the past 24 hour(s))  Protein / creatinine ratio, urine     Status: Abnormal   Collection Time: 03/13/19  1:55 AM  Result Value Ref Range   Creatinine, Urine 20.95 mg/dL   Total Protein, Urine 7 mg/dL   Protein Creatinine Ratio 0.33 (H) 0.00 - 0.15 mg/mg[Cre]  Comprehensive metabolic panel     Status: Abnormal   Collection Time: 03/13/19  2:25 AM  Result Value Ref Range   Sodium 137 135 - 145 mmol/L   Potassium 3.8 3.5 - 5.1 mmol/L   Chloride 104 98 - 111 mmol/L   CO2 23 22 - 32 mmol/L   Glucose, Bld 103 (H) 70 - 99 mg/dL   BUN 9 6 - 20 mg/dL   Creatinine, Ser 05/11/19 0.44 - 1.00 mg/dL   Calcium 9.1 8.9 - 7.10 mg/dL   Total Protein 6.2 (L) 6.5 - 8.1 g/dL   Albumin 2.7 (L) 3.5 - 5.0 g/dL   AST 33 15 - 41 U/L   ALT 43 0 - 44 U/L   Alkaline Phosphatase 130 (H) 38 - 126 U/L   Total Bilirubin 0.3 0.3 - 1.2 mg/dL    GFR calc non Af Amer >60 >60 mL/min   GFR calc Af Amer >60 >60 mL/min  Anion gap 10 5 - 15  CBC     Status: Abnormal   Collection Time: 03/13/19  2:25 AM  Result Value Ref Range   WBC 10.8 (H) 4.0 - 10.5 K/uL   RBC 4.32 3.87 - 5.11 MIL/uL   Hemoglobin 10.6 (L) 12.0 - 15.0 g/dL   HCT 79.0 (L) 24.0 - 97.3 %   MCV 78.0 (L) 80.0 - 100.0 fL   MCH 24.5 (L) 26.0 - 34.0 pg   MCHC 31.5 30.0 - 36.0 g/dL   RDW 53.2 (H) 99.2 - 42.6 %   Platelets 271 150 - 400 K/uL   nRBC 0.0 0.0 - 0.2 %   -Hgb on DC 03/09/19 was 9.7>>> Today 10.6  -Case discussed with Dr. Earlene Plater, recommend DC home with BP check at [facility] in one week.  Assessment and Plan  34 yo G2P2002 on PPD#5 who presented to MAU after passing a large blood clot, subsequently found to have elevated BPs -P:Cr 0.33, but patient asymptomatic -Most likely gHTN, will DC home on enalapril- script sent -Strict return precautions given -DC to home   Carlissa Pesola L Peighton Edgin 03/13/2019, 1:57 AM

## 2019-03-13 NOTE — MAU Note (Signed)
Patient reports "slight dull chest pain" denies SOB or pressure. Patient states pain is moving over across top of chest. Patient reports felling anxious.

## 2019-03-13 NOTE — Discharge Instructions (Signed)

## 2019-03-15 NOTE — Telephone Encounter (Signed)
Prior authorization has been submitted on [employer] again. Currently awaiting approval/denial.

## 2019-03-15 NOTE — Telephone Encounter (Signed)
The prior authorization was denied  

## 2019-03-15 NOTE — Telephone Encounter (Signed)
Would prefer she be on Symbicort as proven safe to use during pregnancy.  Please try PA again

## 2019-03-16 NOTE — Telephone Encounter (Signed)
I spoke with the medicaid representative and she informed me that medicaid will cover Symbicort as long as it is Brand name only. I have contacted the pharmacy and let them know to run the prescription as brand product only.

## 2019-03-16 NOTE — Telephone Encounter (Signed)
Ok lets try calling the number.

## 2019-03-16 NOTE — Telephone Encounter (Signed)
They have denied the prior auth again. We may have to call the medicaid number [phone].

## 2019-06-02 ENCOUNTER — Ambulatory Visit: Payer: PRIVATE HEALTH INSURANCE | Attending: Internal Medicine

## 2019-06-02 DIAGNOSIS — Z23 Encounter for immunization: Secondary | ICD-10-CM

## 2019-06-02 NOTE — Progress Notes (Signed)
   Covid-19 Vaccination Clinic  Name:  KYMBERLY BLOMBERG    MRN: [id number] DOB: 05/21/1985  06/02/2019  Ms. [name] was observed post Covid-19 immunization for 15 minutes without incident. She was provided with Vaccine Information Sheet and instruction to access the V-Safe system.   Ms. [name] was instructed to call 911 with any severe reactions post vaccine: Marland Kitchen Difficulty breathing  . Swelling of face and throat  . A fast heartbeat  . A bad rash all over body  . Dizziness and weakness   Immunizations Administered    Name Date Dose VIS Date Route   Pfizer COVID-19 Vaccine 06/02/2019 12:41 PM 0.3 mL 02/17/2019 Intramuscular   Manufacturer: [employer], [employer]   Lot: [id number]   NDC: [address]-0

## 2019-06-21 ENCOUNTER — Ambulatory Visit: Payer: PRIVATE HEALTH INSURANCE | Attending: Internal Medicine

## 2019-06-21 ENCOUNTER — Other Ambulatory Visit: Payer: Self-pay | Admitting: Allergy

## 2019-06-21 DIAGNOSIS — J3089 Other allergic rhinitis: Secondary | ICD-10-CM

## 2019-06-21 DIAGNOSIS — Z20822 Contact with and (suspected) exposure to covid-19: Secondary | ICD-10-CM

## 2019-06-21 DIAGNOSIS — J4540 Moderate persistent asthma, uncomplicated: Secondary | ICD-10-CM

## 2019-06-23 ENCOUNTER — Telehealth: Payer: Self-pay

## 2019-06-23 NOTE — Telephone Encounter (Signed)
Pt. Checking for COVID 19 results, not available yet.

## 2019-06-28 ENCOUNTER — Ambulatory Visit: Payer: PRIVATE HEALTH INSURANCE | Attending: Internal Medicine

## 2019-06-28 DIAGNOSIS — Z23 Encounter for immunization: Secondary | ICD-10-CM

## 2019-06-28 NOTE — Progress Notes (Signed)
   Covid-19 Vaccination Clinic  Name:  Debra Guerrero    MRN: [id number] DOB: October 27, 1985  06/28/2019  Ms. Debra Guerrero was observed post Covid-19 immunization for 15 minutes without incident. She was provided with Vaccine Information Sheet and instruction to access the V-Safe system.   Ms. Debra Guerrero was instructed to call 911 with any severe reactions post vaccine: Marland Kitchen Difficulty breathing  . Swelling of face and throat  . A fast heartbeat  . A bad rash all over body  . Dizziness and weakness   Immunizations Administered    Name Date Dose VIS Date Route   Pfizer COVID-19 Vaccine 06/28/2019 11:51 AM 0.3 mL 05/03/2018 Intramuscular   Manufacturer: [employer], [employer]   Lot: [id number]   NDC: [address]-5

## 2019-08-02 ENCOUNTER — Other Ambulatory Visit: Payer: Self-pay | Admitting: Allergy

## 2019-08-02 DIAGNOSIS — J3089 Other allergic rhinitis: Secondary | ICD-10-CM

## 2019-08-02 DIAGNOSIS — J4540 Moderate persistent asthma, uncomplicated: Secondary | ICD-10-CM

## 2019-08-04 ENCOUNTER — Other Ambulatory Visit: Payer: Self-pay

## 2019-08-04 ENCOUNTER — Encounter: Payer: Self-pay | Admitting: Allergy

## 2019-08-04 ENCOUNTER — Ambulatory Visit (INDEPENDENT_AMBULATORY_CARE_PROVIDER_SITE_OTHER): Payer: Medicaid Other | Admitting: Allergy

## 2019-08-04 VITALS — BP 126/88 | HR 79 | Temp 98.4°F | Resp 16 | Ht 65.0 in | Wt 235.8 lb

## 2019-08-04 DIAGNOSIS — J3089 Other allergic rhinitis: Secondary | ICD-10-CM

## 2019-08-04 DIAGNOSIS — H1013 Acute atopic conjunctivitis, bilateral: Secondary | ICD-10-CM | POA: Diagnosis not present

## 2019-08-04 DIAGNOSIS — J4540 Moderate persistent asthma, uncomplicated: Secondary | ICD-10-CM | POA: Insufficient documentation

## 2019-08-04 DIAGNOSIS — T7800XD Anaphylactic reaction due to unspecified food, subsequent encounter: Secondary | ICD-10-CM

## 2019-08-04 MED ORDER — CETIRIZINE HCL 10 MG PO TABS: 10.0000 mg | ORAL_TABLET | Freq: Every day | ORAL | 5 refills | Status: DC

## 2019-08-04 MED ORDER — EPINEPHRINE 0.3 MG/0.3ML IJ SOAJ: INTRAMUSCULAR | 1 refills | Status: DC

## 2019-08-04 MED ORDER — OLOPATADINE HCL 0.2 % OP SOLN: 1.0000 [drp] | Freq: Every day | OPHTHALMIC | 5 refills | Status: DC | PRN

## 2019-08-04 MED ORDER — MONTELUKAST SODIUM 10 MG PO TABS: ORAL_TABLET | ORAL | 5 refills | Status: DC

## 2019-08-04 NOTE — Patient Instructions (Addendum)
Asthma Continue Symbicort 160/4.5 mcg - 2 puffs twice a day with spacer.  Rinse mouth out afterwards to prevent thrush. Continue Singulair 10 mg once a day to help prevent cough and wheeze. May use Ventolin 2 puffs every 4 hours as needed for coughing, wheezing, tightness in chest or shortness of breath.  Seasonal and perennial allergic rhinitis Continue cetirizine 10 mg once a day as needed for runny nose or itching. May use Rhinocort nasal spray 1 to 2 sprays per nostril once a day as needed for nasal congestion. 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 In case of an allergic reaction, give Benadryl 4 teaspoonfuls every 4 hours, and if life-threatening symptoms occur, inject with EpiPen 0.3 mg. Return for skin testing to shellfish.  Please remain off all antihistamines 3 days prior to skin testing.  Continue all other scheduled medications. Please let us know if this treatment plan is not working well for you. Schedule follow-up appointment in 6 months.

## 2019-08-04 NOTE — Progress Notes (Signed)
[place]  [id number] Dept: [phone]  FOLLOW UP NOTE  Patient ID: Debra Guerrero, female    DOB: [birth year]  Age: 34 y.o. MRN: [id number] Date of Office Visit: 08/04/2019  Assessment  Chief Complaint: Asthma  HPI Debra Guerrero is a 34 year old female who presents for follow-up of moderate persistent asthma, seasonal and perennial allergic rhinitis and food allergy.   Since her last office visit on March 08, 2019 she delivered a baby girl and is not breast-feeding at this time. Asthma is reported as well controlled with the use of Singulair 10 mg once a day and Symbicort 160/4.5 mcg using 2 puffs at night.  At times she forgets to use her Symbicort in the morning.  She reports rare use of albuterol and denies any coughing, wheezing, tightness in her chest, shortness of breath or nocturnal symptoms.  She has not required any systemic steroids or made any trips to the emergency room or urgent care since her last office visit. Allergic rhinitis is reported as moderately controlled with the use of Zyrtec 10 mg once a day and montelukast 10 mg once a day.  She is not using Rhinocort nasal spray as she did not feel like it gave her much benefit.  She reports a lot of sneezing and occasional nasal congestion and rhinorrhea.  She denies any postnasal drip. Allergic conjunctivitis is reported as not well controlled.  For the past couple months with the pollen she has had itchy, red, watery eyes.  She currently does not have any medication to use for the symptoms. She is currently avoiding shellfish and has not had any accidental ingestion or use of her epinephrine autoinjector.  She is interested in getting a re-skin tested to shellfish to see if it is possible for her to eat shrimp.  Medications are as listed in chart.  Drug Allergies:  Allergies  Allergen Reactions  . Shellfish Allergy Hives and Shortness Of Breath    Physical Exam: BP 126/88   Pulse 79   Temp  98.4 F (36.9 C) (Temporal)   Resp 16   Ht 5\' 5"  (1.651 m)   Wt 235 lb 12.8 oz (107 kg)   SpO2 97%   BMI 39.24 kg/m    Physical Exam Constitutional:      Appearance: Normal appearance.  HENT:     Head: Normocephalic and atraumatic.     Comments: Pharynx normal. Eyes normal. Ears normal. Nose: bilateral turbinates mildly edematous and erythematous.    Right Ear: Tympanic membrane, ear canal and external ear normal.     Left Ear: Tympanic membrane, ear canal and external ear normal.     Mouth/Throat:     Mouth: Mucous membranes are moist.     Pharynx: Oropharynx is clear.  Eyes:     Conjunctiva/sclera: Conjunctivae normal.  Cardiovascular:     Rate and Rhythm: Normal rate and regular rhythm.     Heart sounds: Normal heart sounds.  Pulmonary:     Effort: Pulmonary effort is normal.     Breath sounds: Normal breath sounds.     Comments: Lungs clear to auscultation. Musculoskeletal:     Cervical back: Neck supple.  Skin:    General: Skin is warm.  Neurological:     Mental Status: She is alert and oriented to person, place, and time.  Psychiatric:        Mood and Affect: Mood normal.        Behavior: Behavior normal.  Thought Content: Thought content normal.        Judgment: Judgment normal.     Diagnostics: FVC 3.20 L, FEV1 2.49 L.  Predicted FVC 3.29 L, FEV1 2.77 L.  Ratio is consistent with nonobstructive pattern.  Assessment and Plan: 1. Moderate persistent asthma, uncomplicated   2. Allergy with anaphylaxis due to food, subsequent encounter   3. Seasonal and perennial allergic rhinitis   4. Allergic conjunctivitis of both eyes     Patient Instructions  Asthma Continue Symbicort 160/4.5 mcg - 2 puffs twice a day with spacer.  Rinse mouth out afterwards to prevent thrush. Continue Singulair 10 mg once a day to help prevent cough and wheeze. May use Ventolin 2 puffs every 4 hours as needed for coughing, wheezing, tightness in chest or shortness of  breath.  Seasonal and perennial allergic rhinitis Continue cetirizine 10 mg once a day as needed for runny nose or itching. May use Rhinocort nasal spray 1 to 2 sprays per nostril once a day as needed for nasal congestion or with ear fullness symptoms. 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 In case of an allergic reaction, give Benadryl 4 teaspoonfuls every 4 hours, and if life-threatening symptoms occur, inject with EpiPen 0.3 mg. Return for skin testing to shellfish.  Please remain off all antihistamines 3 days prior to skin testing.  Continue all other scheduled medications. Please let us know if this treatment plan is not working well for you. Schedule follow-up appointment in 6 months.  Thank you for the opportunity to care for this patient.  Please do not hesitate to contact me with questions.  Nehemiah Settle, FNP [facility]  -------------------------- Attestation:  I performed/discussed the history and physical examination of the patient as well as management with NP Ambs. I reviewed the NP's note and agree with the documented findings and plan of care with following additions/exceptions: none  Margo Aye, MD [facility]

## 2019-08-07 ENCOUNTER — Other Ambulatory Visit: Payer: Self-pay

## 2019-08-07 ENCOUNTER — Emergency Department (EMERGENCY_DEPARTMENT_HOSPITAL)
Admission: EM | Admit: 2019-08-07 | Discharge: 2019-08-08 | Disposition: A | Discharge status: Home / Self Care | Payer: Medicaid Other | Source: Home / Self Care | Attending: Emergency Medicine | Admitting: Emergency Medicine

## 2019-08-07 ENCOUNTER — Emergency Department (HOSPITAL_BASED_OUTPATIENT_CLINIC_OR_DEPARTMENT_OTHER): Payer: Medicaid Other

## 2019-08-07 ENCOUNTER — Encounter (HOSPITAL_COMMUNITY): Payer: Self-pay

## 2019-08-07 ENCOUNTER — Encounter (HOSPITAL_BASED_OUTPATIENT_CLINIC_OR_DEPARTMENT_OTHER): Payer: Self-pay | Admitting: *Deleted

## 2019-08-07 ENCOUNTER — Emergency Department (HOSPITAL_COMMUNITY)
Admission: EM | Admit: 2019-08-07 | Discharge: 2019-08-07 | Disposition: A | Discharge status: Home / Self Care | Payer: Medicaid Other | Attending: Emergency Medicine | Admitting: Emergency Medicine

## 2019-08-07 DIAGNOSIS — H469 Unspecified optic neuritis: Secondary | ICD-10-CM

## 2019-08-07 DIAGNOSIS — H53131 Sudden visual loss, right eye: Secondary | ICD-10-CM | POA: Diagnosis not present

## 2019-08-07 DIAGNOSIS — Z20822 Contact with and (suspected) exposure to covid-19: Secondary | ICD-10-CM | POA: Insufficient documentation

## 2019-08-07 DIAGNOSIS — Z79899 Other long term (current) drug therapy: Secondary | ICD-10-CM | POA: Insufficient documentation

## 2019-08-07 DIAGNOSIS — Z5321 Procedure and treatment not carried out due to patient leaving prior to being seen by health care provider: Secondary | ICD-10-CM | POA: Diagnosis not present

## 2019-08-07 DIAGNOSIS — Z87891 Personal history of nicotine dependence: Secondary | ICD-10-CM | POA: Insufficient documentation

## 2019-08-07 LAB — I-STAT CHEM 8, ED
BUN: 13 mg/dL (ref 6–20)
Calcium, Ion: 1.26 mmol/L (ref 1.15–1.40)
Chloride: 103 mmol/L (ref 98–111)
Creatinine, Ser: 0.6 mg/dL (ref 0.44–1.00)
Glucose, Bld: 95 mg/dL (ref 70–99)
HCT: 40 % (ref 36.0–46.0)
Hemoglobin: 13.6 g/dL (ref 12.0–15.0)
Potassium: 3.8 mmol/L (ref 3.5–5.1)
Sodium: 138 mmol/L (ref 135–145)
TCO2: 26 mmol/L (ref 22–32)

## 2019-08-07 LAB — COMPREHENSIVE METABOLIC PANEL
ALT: 18 U/L (ref 0–44)
AST: 15 U/L (ref 15–41)
Albumin: 3.9 g/dL (ref 3.5–5.0)
Alkaline Phosphatase: 94 U/L (ref 38–126)
Anion gap: 8 (ref 5–15)
BUN: 11 mg/dL (ref 6–20)
CO2: 26 mmol/L (ref 22–32)
Calcium: 9.4 mg/dL (ref 8.9–10.3)
Chloride: 103 mmol/L (ref 98–111)
Creatinine, Ser: 0.62 mg/dL (ref 0.44–1.00)
GFR calc Af Amer: >60 mL/min (ref >60)
GFR calc non Af Amer: >60 mL/min (ref >60)
Glucose, Bld: 101 mg/dL — ABNORMAL HIGH (ref 70–99)
Potassium: 4.2 mmol/L (ref 3.5–5.1)
Sodium: 137 mmol/L (ref 135–145)
Total Bilirubin: 0.4 mg/dL (ref 0.3–1.2)
Total Protein: 7.6 g/dL (ref 6.5–8.1)

## 2019-08-07 LAB — CBC
HCT: 40.4 % (ref 36.0–46.0)
Hemoglobin: 12.8 g/dL (ref 12.0–15.0)
MCH: 25.9 pg — ABNORMAL LOW (ref 26.0–34.0)
MCHC: 31.7 g/dL (ref 30.0–36.0)
MCV: 81.6 fL (ref 80.0–100.0)
Platelets: 364 10*3/uL (ref 150–400)
RBC: 4.95 MIL/uL (ref 3.87–5.11)
RDW: 14.9 % (ref 11.5–15.5)
WBC: 13.8 10*3/uL — ABNORMAL HIGH (ref 4.0–10.5)
nRBC: 0 % (ref 0.0–0.2)

## 2019-08-07 LAB — DIFFERENTIAL
Abs Immature Granulocytes: 0.04 10*3/uL (ref 0.00–0.07)
Basophils Absolute: 0.1 10*3/uL (ref 0.0–0.1)
Basophils Relative: 1 %
Eosinophils Absolute: 0.1 10*3/uL (ref 0.0–0.5)
Eosinophils Relative: 1 %
Immature Granulocytes: 0 %
Lymphocytes Relative: 36 %
Lymphs Abs: 5 10*3/uL — ABNORMAL HIGH (ref 0.7–4.0)
Monocytes Absolute: 0.6 10*3/uL (ref 0.1–1.0)
Monocytes Relative: 5 %
Neutro Abs: 8 10*3/uL — ABNORMAL HIGH (ref 1.7–7.7)
Neutrophils Relative %: 57 %

## 2019-08-07 LAB — APTT: aPTT: 33 seconds (ref 24–36)

## 2019-08-07 LAB — I-STAT BETA HCG BLOOD, ED (MC, WL, AP ONLY): I-stat hCG, quantitative: <5 m[IU]/mL (ref <5)

## 2019-08-07 LAB — PROTIME-INR
INR: 1 (ref 0.8–1.2)
Prothrombin Time: 13.2 seconds (ref 11.4–15.2)

## 2019-08-07 MED ORDER — METOCLOPRAMIDE HCL 5 MG/ML IJ SOLN
10.0000 mg | Freq: Once | INTRAMUSCULAR | Status: AC
  Administered 2019-08-07: 10 mg via INTRAVENOUS
  Filled 2019-08-07: qty 2

## 2019-08-07 MED ORDER — DIPHENHYDRAMINE HCL 50 MG/ML IJ SOLN
25.0000 mg | Freq: Once | INTRAMUSCULAR | Status: DC
  Filled 2019-08-07: qty 1

## 2019-08-07 MED ORDER — LACTATED RINGERS IV BOLUS
1000.0000 mL | Freq: Once | INTRAVENOUS | Status: AC
  Administered 2019-08-08: 1000 mL via INTRAVENOUS

## 2019-08-07 MED ORDER — METHYLPREDNISOLONE SODIUM SUCC 125 MG IJ SOLR
125.0000 mg | Freq: Once | INTRAMUSCULAR | Status: AC
  Administered 2019-08-07: 125 mg via INTRAVENOUS
  Filled 2019-08-07: qty 2

## 2019-08-07 NOTE — ED Triage Notes (Signed)
Headache for a week. She went to [facility] tonight due to loss of vision in her right eye since this am. Wait was long so she left and came here.

## 2019-08-07 NOTE — ED Notes (Signed)
Pt states she is leaving  

## 2019-08-07 NOTE — ED Notes (Signed)
Pt. Reports she lost her vision in the R eye today at approx. 8am with no headache.  The Migraine was over after she went to bed last PM at 2200  Per Pt.  Pt. Reports the Migraine started on last Monday just behind the R eye.  Pt. Reports nausea with the migraine.

## 2019-08-07 NOTE — ED Triage Notes (Signed)
PT arrives POV for eval of R sided blurry vision, nearing complete vision loss. Pt reports she noted migraine onset last Monday w/ minimal R eye blurry vision. States she saw her PCP and then from that time noted worsening of the blurry vision up until this AM when she reports it is nearly completely gone. No unilateral weakness, aphasia, numbness/tingling, no ataxia on finger to nose or gait. Reports HA much better, now at a 1/10.

## 2019-08-08 ENCOUNTER — Emergency Department (HOSPITAL_COMMUNITY): Payer: Medicaid Other

## 2019-08-08 DIAGNOSIS — Z20822 Contact with and (suspected) exposure to covid-19: Secondary | ICD-10-CM | POA: Diagnosis not present

## 2019-08-08 DIAGNOSIS — H53131 Sudden visual loss, right eye: Secondary | ICD-10-CM | POA: Diagnosis present

## 2019-08-08 DIAGNOSIS — H469 Unspecified optic neuritis: Secondary | ICD-10-CM

## 2019-08-08 DIAGNOSIS — Z5321 Procedure and treatment not carried out due to patient leaving prior to being seen by health care provider: Secondary | ICD-10-CM | POA: Diagnosis not present

## 2019-08-08 LAB — COMPREHENSIVE METABOLIC PANEL
ALT: 18 U/L (ref 0–44)
AST: 13 U/L — ABNORMAL LOW (ref 15–41)
Albumin: 3.9 g/dL (ref 3.5–5.0)
Alkaline Phosphatase: 93 U/L (ref 38–126)
Anion gap: 9 (ref 5–15)
BUN: 10 mg/dL (ref 6–20)
CO2: 26 mmol/L (ref 22–32)
Calcium: 9 mg/dL (ref 8.9–10.3)
Chloride: 103 mmol/L (ref 98–111)
Creatinine, Ser: 0.57 mg/dL (ref 0.44–1.00)
GFR calc Af Amer: >60 mL/min (ref >60)
GFR calc non Af Amer: >60 mL/min (ref >60)
Glucose, Bld: 108 mg/dL — ABNORMAL HIGH (ref 70–99)
Potassium: 3.8 mmol/L (ref 3.5–5.1)
Sodium: 138 mmol/L (ref 135–145)
Total Bilirubin: 0.5 mg/dL (ref 0.3–1.2)
Total Protein: 7.6 g/dL (ref 6.5–8.1)

## 2019-08-08 LAB — URINALYSIS, ROUTINE W REFLEX MICROSCOPIC
Bilirubin Urine: NEGATIVE
Glucose, UA: NEGATIVE mg/dL
Hgb urine dipstick: NEGATIVE
Ketones, ur: NEGATIVE mg/dL
Leukocytes,Ua: NEGATIVE
Nitrite: NEGATIVE
Protein, ur: NEGATIVE mg/dL
Specific Gravity, Urine: 1.02 (ref 1.005–1.030)
pH: 6 (ref 5.0–8.0)

## 2019-08-08 LAB — CBC WITH DIFFERENTIAL/PLATELET
Abs Immature Granulocytes: 0.07 10*3/uL (ref 0.00–0.07)
Basophils Absolute: 0.1 10*3/uL (ref 0.0–0.1)
Basophils Relative: 1 %
Eosinophils Absolute: 0.1 10*3/uL (ref 0.0–0.5)
Eosinophils Relative: 0 %
HCT: 38.7 % (ref 36.0–46.0)
Hemoglobin: 12.5 g/dL (ref 12.0–15.0)
Immature Granulocytes: 0 %
Lymphocytes Relative: 27 %
Lymphs Abs: 4.5 10*3/uL — ABNORMAL HIGH (ref 0.7–4.0)
MCH: 25.7 pg — ABNORMAL LOW (ref 26.0–34.0)
MCHC: 32.3 g/dL (ref 30.0–36.0)
MCV: 79.6 fL — ABNORMAL LOW (ref 80.0–100.0)
Monocytes Absolute: 0.8 10*3/uL (ref 0.1–1.0)
Monocytes Relative: 5 %
Neutro Abs: 11.3 10*3/uL — ABNORMAL HIGH (ref 1.7–7.7)
Neutrophils Relative %: 67 %
Platelets: 344 10*3/uL (ref 150–400)
RBC: 4.86 MIL/uL (ref 3.87–5.11)
RDW: 15.1 % (ref 11.5–15.5)
WBC: 16.8 10*3/uL — ABNORMAL HIGH (ref 4.0–10.5)
nRBC: 0 % (ref 0.0–0.2)

## 2019-08-08 LAB — PREGNANCY, URINE: Preg Test, Ur: NEGATIVE

## 2019-08-08 LAB — SARS CORONAVIRUS 2 BY RT PCR (HOSPITAL ORDER, PERFORMED IN ~~LOC~~ HOSPITAL LAB): SARS Coronavirus 2: NEGATIVE

## 2019-08-08 MED ORDER — PANTOPRAZOLE SODIUM 40 MG IV SOLR
40.0000 mg | INTRAVENOUS | Status: DC
  Administered 2019-08-08: 40 mg via INTRAVENOUS
  Filled 2019-08-08: qty 40

## 2019-08-08 MED ORDER — ASPIRIN 81 MG PO CHEW
324.0000 mg | CHEWABLE_TABLET | Freq: Once | ORAL | Status: AC
  Administered 2019-08-08: 324 mg via ORAL
  Filled 2019-08-08: qty 4

## 2019-08-08 MED ORDER — GADOBUTROL 1 MMOL/ML IV SOLN
10.0000 mL | Freq: Once | INTRAVENOUS | Status: AC | PRN
  Administered 2019-08-08: 10 mL via INTRAVENOUS

## 2019-08-08 MED ORDER — SODIUM CHLORIDE 0.9 % IV SOLN
1000.0000 mg | Freq: Every day | INTRAVENOUS | Status: DC
  Administered 2019-08-08: 1000 mg via INTRAVENOUS
  Filled 2019-08-08: qty 8

## 2019-08-08 MED ORDER — METHYLPREDNISOLONE SODIUM SUCC 1000 MG IJ SOLR: 1000.0000 mg | Freq: Every day | INTRAMUSCULAR | 0 refills | Status: DC

## 2019-08-08 MED ORDER — PANTOPRAZOLE SODIUM 40 MG PO TBEC
40.0000 mg | DELAYED_RELEASE_TABLET | Freq: Every day | ORAL | 0 refills | Status: DC
Start: 2019-08-09 — End: 2020-05-15

## 2019-08-08 NOTE — ED Notes (Addendum)
Tele Neuro speaking with Pt. Via computer screen visual conversation and assessment.  Pt. Able to follow all commands reporting she has no headache and no pain only no vision from the R eye .  Pt. Able to speak clearly.

## 2019-08-08 NOTE — Discharge Instructions (Addendum)
You were seen in the emergency department today for a headache with change in your vision.  Your MRI showed that you have optic neuritis which we suspect is causing your symptoms.  We are sending you home with prescriptions for a steroid to use as injections once per day for the next 4 days starting tomorrow.  Please do this as explained by the nursing staff.   We are also sending home with Protonix to take orally once per day starting tomorrow.  We have prescribed you new medication(s) today. Discuss the medications prescribed today with your pharmacist as they can have adverse effects and interactions with your other medicines including over the counter and prescribed medications. Seek medical evaluation if you start to experience new or abnormal symptoms after taking one of these medicines, seek care immediately if you start to experience difficulty breathing, feeling of your throat closing, facial swelling, or rash as these could be indications of a more serious allergic reaction  We would like you to follow-up very closely with neurology, please call the office tomorrow to schedule follow-up appointment within the next 3 days.  Return to the ER at anytime for new or worsening symptoms including but not limited to worsening pain, worsening change in vision, fever, numbness, weakness, inability to keep fluids down, blood in your stool, abdominal pain, or any other concerns.

## 2019-08-08 NOTE — ED Notes (Signed)
Spoke with Candace @ [facility] [phone] -- they are backed up in MRI - Candace hopes that patient will be able to have MRI done approximately 3-4 AM.  [facility] notified (Tammy) - that patient will need transport at that time to [facility] ED [facility]) will accept.

## 2019-08-08 NOTE — ED Provider Notes (Signed)
07:00: Assumed care of patient from Sharilyn Sites PA-C at change of shift pending MRI.   Please see prior provider note for full H&P. Briefly patient is a 34 yo female with a hx of schizophrenia, asthma, depression, & migraines who presents to the ED with complaints of headache and abnormal vision in the right eye described as shadowed with ability to see faint objects, not complete vision loss.   Physical Exam  BP 115/67   Pulse 68   Temp 98.4 F (36.9 C) (Oral)   Resp 20   Ht 5\' 5"  (1.651 m)   Wt 107 kg   LMP 07/17/2019   SpO2 97%   BMI 39.25 kg/m   Physical Exam Vitals and nursing note reviewed.  Constitutional:      General: She is not in acute distress.    Appearance: She is well-developed.  HENT:     Head: Normocephalic and atraumatic.  Eyes:     General:        Right eye: No discharge.        Left eye: No discharge.     Conjunctiva/sclera: Conjunctivae normal.  Neurological:     Mental Status: She is alert.     Comments: Clear speech.   Psychiatric:        Behavior: Behavior normal.        Thought Content: Thought content normal.     ED Course/Procedures    Results for orders placed or performed during the hospital encounter of 08/07/19  SARS Coronavirus 2 by RT PCR (hospital order, performed in [facility] hospital lab) Nasopharyngeal Nasopharyngeal Swab   Specimen: Nasopharyngeal Swab  Result Value Ref Range   SARS Coronavirus 2 NEGATIVE NEGATIVE  CBC with Differential  Result Value Ref Range   WBC 16.8 (H) 4.0 - 10.5 K/uL   RBC 4.86 3.87 - 5.11 MIL/uL   Hemoglobin 12.5 12.0 - 15.0 g/dL   HCT [facility] 28.3 - 15.1 %   MCV 79.6 (L) 80.0 - 100.0 fL   MCH 25.7 (L) 26.0 - 34.0 pg   MCHC 32.3 30.0 - 36.0 g/dL   RDW 76.1 60.7 - 37.1 %   Platelets 344 150 - 400 K/uL   nRBC 0.0 0.0 - 0.2 %   Neutrophils Relative % 67 %   Neutro Abs 11.3 (H) 1.7 - 7.7 K/uL   Lymphocytes Relative 27 %   Lymphs Abs 4.5 (H) 0.7 - 4.0 K/uL   Monocytes Relative 5 %   Monocytes  Absolute 0.8 0.1 - 1.0 K/uL   Eosinophils Relative 0 %   Eosinophils Absolute 0.1 0.0 - 0.5 K/uL   Basophils Relative 1 %   Basophils Absolute 0.1 0.0 - 0.1 K/uL   Immature Granulocytes 0 %   Abs Immature Granulocytes 0.07 0.00 - 0.07 K/uL  Comprehensive metabolic panel  Result Value Ref Range   Sodium 138 135 - 145 mmol/L   Potassium 3.8 3.5 - 5.1 mmol/L   Chloride 103 98 - 111 mmol/L   CO2 26 22 - 32 mmol/L   Glucose, Bld 108 (H) 70 - 99 mg/dL   BUN 10 6 - 20 mg/dL   Creatinine, Ser 06.2 0.44 - 1.00 mg/dL   Calcium 9.0 8.9 - 6.94 mg/dL   Total Protein 7.6 6.5 - 8.1 g/dL   Albumin 3.9 3.5 - 5.0 g/dL   AST 13 (L) 15 - 41 U/L   ALT 18 0 - 44 U/L   Alkaline Phosphatase 93 38 - 126 U/L  Total Bilirubin 0.5 0.3 - 1.2 mg/dL   GFR calc non Af Amer >60 >60 mL/min   GFR calc Af Amer >60 >60 mL/min   Anion gap 9 5 - 15  Urinalysis, Routine w reflex microscopic  Result Value Ref Range   Color, Urine YELLOW YELLOW   APPearance CLEAR CLEAR   Specific Gravity, Urine 1.020 1.005 - 1.030   pH 6.0 5.0 - 8.0   Glucose, UA NEGATIVE NEGATIVE mg/dL   Hgb urine dipstick NEGATIVE NEGATIVE   Bilirubin Urine NEGATIVE NEGATIVE   Ketones, ur NEGATIVE NEGATIVE mg/dL   Protein, ur NEGATIVE NEGATIVE mg/dL   Nitrite NEGATIVE NEGATIVE   Leukocytes,Ua NEGATIVE NEGATIVE  Pregnancy, urine  Result Value Ref Range   Preg Test, Ur NEGATIVE NEGATIVE   CT Head Wo Contrast  Result Date: 08/07/2019 CLINICAL DATA:  Headache with blurred vision EXAM: CT HEAD WITHOUT CONTRAST TECHNIQUE: Contiguous axial images were obtained from the base of the skull through the vertex without intravenous contrast. COMPARISON:  September 09, 2011 FINDINGS: Brain: No evidence of acute territorial infarction, hemorrhage, hydrocephalus,extra-axial collection or mass lesion/mass effect. Normal gray-white differentiation. Ventricles are normal in size and contour. Vascular: No hyperdense vessel or unexpected calcification. Skull: The skull  is intact. No fracture or focal lesion identified. Sinuses/Orbits: The visualized paranasal sinuses and mastoid air cells are clear. The orbits and globes intact. Other: None IMPRESSION: No acute intracranial abnormality. Electronically Signed   By: Jonna Clark M.D.   On: 08/07/2019 23:40   MR Brain W and Wo Contrast  Result Date: 08/08/2019 CLINICAL DATA:  Right-sided blurry vision. Headache, basilar or orbital EXAM: MRI HEAD AND ORBITS WITHOUT AND WITH CONTRAST TECHNIQUE: Multiplanar, multiecho pulse sequences of the brain and surrounding structures were obtained without and with intravenous contrast. Multiplanar, multiecho pulse sequences of the orbits and surrounding structures were obtained including fat saturation techniques, before and after intravenous contrast administration. CONTRAST:  85mL GADAVIST GADOBUTROL 1 MMOL/ML IV SOLN COMPARISON:  None. FINDINGS: MRI HEAD FINDINGS Brain: No acute infarction, hemorrhage, hydrocephalus, extra-axial collection or mass lesion. No white matter disease or atrophy Vascular: Normal flow voids and vessel enhancements Skull and upper cervical spine: Normal marrow signal MRI ORBITS FINDINGS Orbits: T2 hyperintensity and enhancement in the retro bulbar right optic nerve. No masslike finding or fat inflammation. Normal appearance of the extraocular muscles, lacrimal glands, and superior ophthalmic veins. Visualized sinuses: Negative Soft tissues: Negative Limited intracranial: Negative IMPRESSION: 1. Right retro bulbar optic neuritis. 2. Normal appearance of the brain; no white matter disease. Electronically Signed   By: Marnee Spring M.D.   On: 08/08/2019 07:22   MR ORBITS W WO CONTRAST  Result Date: 08/08/2019 CLINICAL DATA:  Right-sided blurry vision. Headache, basilar or orbital EXAM: MRI HEAD AND ORBITS WITHOUT AND WITH CONTRAST TECHNIQUE: Multiplanar, multiecho pulse sequences of the brain and surrounding structures were obtained without and with intravenous  contrast. Multiplanar, multiecho pulse sequences of the orbits and surrounding structures were obtained including fat saturation techniques, before and after intravenous contrast administration. CONTRAST:  17mL GADAVIST GADOBUTROL 1 MMOL/ML IV SOLN COMPARISON:  None. FINDINGS: MRI HEAD FINDINGS Brain: No acute infarction, hemorrhage, hydrocephalus, extra-axial collection or mass lesion. No white matter disease or atrophy Vascular: Normal flow voids and vessel enhancements Skull and upper cervical spine: Normal marrow signal MRI ORBITS FINDINGS Orbits: T2 hyperintensity and enhancement in the retro bulbar right optic nerve. No masslike finding or fat inflammation. Normal appearance of the extraocular muscles, lacrimal glands, and superior ophthalmic  veins. Visualized sinuses: Negative Soft tissues: Negative Limited intracranial: Negative IMPRESSION: 1. Right retro bulbar optic neuritis. 2. Normal appearance of the brain; no white matter disease. Electronically Signed   By: Monte Fantasia M.D.   On: 08/08/2019 07:22   Procedures  MDM   Prior work-up has been reviewed: Labs with leukocytosis at 16.8.  No anemia.  No significant electrolyte derangement.  Pregnancy test is negative.  Covid testing negative.   Plan: Follow up on MRI. Re-discuss with neurology then consult ophthalmology.   MRI reveals right retrobulbar optic neuritis.  Will consult neurology.  07:52: CONSULT: Discussed case with neurologist Dr. Cheral Marker who has reviewed imaging, will see patient in consultation, can hold on steroids at this time.   11:00: Re-discussed with neurology team, patient will need IV steroids, she does not wish to stay in the hospital therefore currently trying to coordinate outpatient IV infusions. 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   11:08: CONSULT: Discussed with infectious disease specialist Dr. Baxter Flattery,  relays this is unlikely to be a relapse, could represent reinfection, relays that if patient has had any concerning unprotected sex in the past 3 months would LP for further assessment, if not not necessary. RPR may still be positive 1:1 given prior syphilis.   Patient monogamous with her husband, no concern for STI, no high risk sexual encounters in the past 3 months. --> Re-discussed with neurology in agreement with no LP & RPR sent.   Re-discussed with neurology, patient continues to request discharge home, discussed with Etta Quill PA-C with neurology, recommends discharge home with prescription for 1 g of Solu-Medrol IV and oral or IV protonix 40 mg for the next 4 days starting tomorrow. Outpatient follow up. Case management involved to facilitate IV infusions at home. I discussed results, treatment plan, need for follow-up, and return precautions with the patient. Provided opportunity for questions, patient confirmed understanding and is in agreement with plan.   Findings and plan of care discussed with supervising physician Dr. Sedonia Small who is in agreement.      Amaryllis Dyke, PA-C 08/08/19 1417    Maudie Flakes, MD 08/14/19 [phone]

## 2019-08-08 NOTE — ED Notes (Signed)
Pt reports her vision is starting to return.

## 2019-08-08 NOTE — ED Provider Notes (Signed)
Emergency Department Provider Note  I have reviewed the triage vital signs and the nursing notes.  HISTORY  Chief Complaint Loss of Vision   HPI Debra Guerrero is a 34 y.o. female without significant past medical history who presents the emergency department today secondary to vision changes.  Patient states that she started having a headache about a week ago and it seemed to be behind her right eye.  She states that there is headache and been off and on for the last week but this morning she woke up and headache was gone.  But it was replaced by worsening blurry vision.  She has some blurry vision over the last week but is much worse this morning and throughout the day she states she got the point where things were very very blurry she only she is grazed and shadows but cannot identify specific colors in her right eye.  She states that her last known visual normal was around 10:00 last night.  She went to [facility] earlier today and left without being seen and presents here now for further evaluation with no change in her vision.  She is without a headache at this point.  She has no pain in her eye.  She has no temporal pain.   No other associated or modifying symptoms.    Past Medical History:  Diagnosis Date  . Asthma   . Depression   . Headache(784.0)   . 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 Seasonal allergies 09/11/2011    Patient Active Problem List   Diagnosis Date Noted  . Moderate persistent asthma, uncomplicated 08/04/2019  . Allergic conjunctivitis of both eyes 08/04/2019  . Urticaria 03/26/2015  . Moderate persistent asthma 03/26/2015  . 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 Encephalopathy acute 09/11/2011  . Tachycardia 09/11/2011  . Seasonal and perennial allergic rhinitis 09/11/2011  . Hypokalemia 09/11/2011  . Altered mental status 09/11/2011  .  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 MIGRAINE, COMMON, INTRACTABLE 04/25/2010    Past Surgical History:  Procedure Laterality Date  . NO PAST SURGERIES      Current Outpatient Rx  . Order #: [id number] Class: Normal  . Order #: [id number] Class: Normal  . Order #: [id number] Class: Normal  . Order #: [id number] Class: Historical Med  . Order #: [id number] Class: Normal  . Order #: [id number] Class: Normal  . Order #: [id number] Class: Print  . Order #: [id number] Class: Normal  . Order #: [id number] Class: Historical Med  . Order #: [id number] Class: Historical Med  . Order #: [id number] Class: Normal  . Order #: [id number] Class: Normal    Allergies Shellfish allergy  Family History  Problem Relation Age of Onset  . Asthma Mother   . Asthma Father   . Eczema Sister   . Allergic rhinitis Neg Hx   . Angioedema Neg Hx   . Atopy Neg Hx   . Immunodeficiency Neg Hx   . Urticaria Neg Hx     Social History Social History   Tobacco Use  . Smoking status: Former Smoker    Types: Cigarettes  . Smokeless tobacco: Never Used  Substance Use Topics  . Alcohol use: No  . Drug use: Yes    Types: Marijuana    Review of Systems  All other systems negative except as documented in the HPI. All pertinent positives and negatives as reviewed in the HPI. ____________________________________________  PHYSICAL EXAM:  VITAL SIGNS:  ED Triage Vitals  Enc Vitals Group     BP 08/07/19 2157 (!) 149/94     Pulse Rate 08/07/19 2157 95     Resp 08/07/19 2157 18     Temp 08/07/19 2157 98.8 F (37.1 C)     Temp Source 08/07/19 2157 Oral     SpO2 08/07/19 2157 100 %     Weight 08/07/19 2154 235 lb 14.3 oz (107 kg)     Height 08/07/19 2154 5\' 5"  (1.651 m)    Constitutional: Alert and oriented. Well appearing and in no acute distress. Eyes: Conjunctivae are normal. PERRL. EOMI. seems that her right pupil stays more dilated than her left but they both constrict the light equally.  Funduscopic  exam on right without any evidence of hemorrhage or obvious papilledema. Head: Atraumatic. Nose: No congestion/rhinnorhea. Mouth/Throat: Mucous membranes are moist.  Oropharynx non-erythematous. Neck: No stridor.  No meningeal signs.   Cardiovascular: Normal rate, regular rhythm. Good peripheral circulation. Grossly normal heart sounds.   Respiratory: Normal respiratory effort.  No retractions. Lungs CTAB. Gastrointestinal: Soft and nontender. No distention.  Musculoskeletal: No lower extremity tenderness nor edema. No gross deformities of extremities. Neurologic:  No altered mental status, able to give full seemingly accurate history.  Face is symmetric, EOM's intact, pupils equal and reactive, vision significantly decreased out of her right eye normal corrected vision in left eye, tongue and uvula midline without deviation. Upper and Lower extremity motor 5/5, intact pain perception in distal extremities, 2+ reflexes in biceps, patella and achilles tendons. Able to perform finger to nose normal with both hands.  Skin:  Skin is warm, dry and intact. No rash noted.  ____________________________________________   LABS (all labs ordered are listed, but only abnormal results are displayed)  Labs Reviewed  CBC WITH DIFFERENTIAL/PLATELET - Abnormal; Notable for the following components:      Result Value   WBC 16.8 (*)    MCV 79.6 (*)    MCH 25.7 (*)    Neutro Abs 11.3 (*)    Lymphs Abs 4.5 (*)    All other components within normal limits  COMPREHENSIVE METABOLIC PANEL - Abnormal; Notable for the following components:   Glucose, Bld 108 (*)    AST 13 (*)    All other components within normal limits  SARS CORONAVIRUS 2 BY RT PCR (HOSPITAL ORDER, PERFORMED IN [facility])  URINALYSIS, ROUTINE W REFLEX MICROSCOPIC  PREGNANCY, URINE   ____________________________________________  RADIOLOGY  CT Head Wo Contrast  Result Date: 08/07/2019 CLINICAL DATA:  Headache with  blurred vision EXAM: CT HEAD WITHOUT CONTRAST TECHNIQUE: Contiguous axial images were obtained from the base of the skull through the vertex without intravenous contrast. COMPARISON:  September 09, 2011 FINDINGS: Brain: No evidence of acute territorial infarction, hemorrhage, hydrocephalus,extra-axial collection or mass lesion/mass effect. Normal gray-white differentiation. Ventricles are normal in size and contour. Vascular: No hyperdense vessel or unexpected calcification. Skull: The skull is intact. No fracture or focal lesion identified. Sinuses/Orbits: The visualized paranasal sinuses and mastoid air cells are clear. The orbits and globes intact. Other: None IMPRESSION: No acute intracranial abnormality. Electronically Signed   By: September 11, 2011 M.D.   On: 08/07/2019 23:40   ____________________________________________  PROCEDURES  Procedure(s) performed:   Procedures ____________________________________________  INITIAL IMPRESSION / ASSESSMENT AND PLAN / ED COURSE   This patient presents to the ED for concern of right vision loss and headache, this involves an extensive number of treatment options, and is a complaint  that carries with it a high risk of complications and morbidity.  The differential diagnosis includes retinal artery occlusion, multiple sclerosis, idiopathic intracranial hypertension, optic neuritis, CVA and complicated migraine.     Lab Tests:   I Ordered, reviewed, and interpreted labs, which included Covid, CBC, CMP and urinalysis all relatively unremarkable.  Medicines ordered:   I ordered medication today cocktail, aspirin and fluids for to see if this was a complicated migraine.  Imaging Studies ordered:   I independently visualized and interpreted imaging CT of her head which was negative for any acute changes.   Additional history obtained:   Additional history obtained from noone  Previous records obtained and reviewed in epic  Consultations  Obtained:   I consulted teleneurology and discussed lab and imaging findings.  After their evaluation they recommended ophthalmology consultation, MRI.  MRI is not able to be done here so will transfer to another facility for the same.  Also ophthalmology consultation to evaluate for retinal artery occlusion.  Patient is well outside the window for TPA.  No acute neurologic intervention at this possibly a stroke.  Reevaluation:  After the interventions stated above, I reevaluated the patient and found still no headache, vision unchanged. Will transfer to [place] for further imaging and specialist evaluation as needed. Dr. Rory Percy aware of patient and her imminent arrival but not formally consulted. States if MRI clean can likely have outpatient workkup but is willing to discuss after completed.  ____________________________________________  FINAL CLINICAL IMPRESSION(S) / ED DIAGNOSES  Final diagnoses:  None    MEDICATIONS GIVEN DURING THIS VISIT:  Medications  diphenhydrAMINE (BENADRYL) injection 25 mg (25 mg Intravenous Refused 08/08/19 0012)  lactated ringers bolus 1,000 mL (0 mLs Intravenous Stopped 08/08/19 0144)  metoCLOPramide (REGLAN) injection 10 mg (10 mg Intravenous Given 08/07/19 2345)  methylPREDNISolone sodium succinate (SOLU-MEDROL) 125 mg/2 mL injection 125 mg (125 mg Intravenous Given 08/07/19 2346)  aspirin chewable tablet 324 mg (324 mg Oral Given 08/08/19 0043)    NEW OUTPATIENT MEDICATIONS STARTED DURING THIS VISIT:  New Prescriptions   No medications on file    Note:  This note was prepared with assistance of Dragon voice recognition software. Occasional wrong-word or sound-a-like substitutions may have occurred due to the inherent limitations of voice recognition software.   Elim Economou, Corene Cornea, MD 08/08/19 [phone]

## 2019-08-08 NOTE — Progress Notes (Signed)
[facility] Tele[facility]urology Consult Services  Stat Consult  Date of Service:   08/07/2019 23:35:12  Impression:     .  I63.00 - Cerebrovascular accident (CVA) due to thrombosis of precerebral artery ([place])     .  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 She is young for temporal arteritis. Stroke is a possibility as well. Will need stat ophthalmology evaluation, MRI brain, MRA head/neck. Outside window for IV tpa or NIR. LKW 10pm when she went to bed and woke up at 8:30 with these symptoms. d/w ER physician at bedside, will transfer to a larger facility for higher level of care.  CT HEAD: Not Reviewed no access to EMR  Metrics: [facility] Notification Time: 08/07/2019 23:33:14 Stamp Time: 08/07/2019 23:35:12 Callback Response Time: 08/07/2019 23:35:56  Our recommendations are outlined below.  Recommendations:     .  ophthalmology consult to rule out CRAO     .  MRI brain     .  asa 325mg  if passes swallow eval in ED  Imaging Studies:     .  MRI Head     .  MRA Head and Neck Without Contrast When Available - Stroke Protocol     .  Echocardiogram - Transthoracic Echocardiogram  Therapies:     .  Physical Therapy, Occupational Therapy, Speech Therapy Assessment When Applicable  Disposition: Neurology Follow Up Recommended  Sign Out:     .  Discussed with Emergency Department Provider  ----------------------------------------------------------------------------------------------------  Chief Complaint: right eye blurry vision for a week as well as a headache  History of Present Illness: Patient is a 34 year old Female.  Patient is a 34 yr old RH woman with hx of asthma who presents with about a week of headache that has subsided, blurry vision and worsening right eye vision this morning. She states that she is unable to see even shadows out  of the right eye. Normal acuity left eye. No pain with eye movement, no ptosis, pupils midline and reactive. No focal deficits. No Nausea and no dizziness.   Past Medical History:     . There is NO history of Hypertension     . There is NO history of Diabetes Mellitus     . There is NO history of Hyperlipidemia     . There is NO history of Atrial Fibrillation     . There is NO history of Coronary Artery Disease     . There is NO history of Stroke  Anticoagulant use:  No  Antiplatelet use: No    Examination: BP(133/86), Pulse(89), Blood Glucose(95) 1A: Level of Consciousness - Alert; keenly responsive + 0 1B: Ask Month and Age - Both Questions Right + 0 1C: Blink Eyes & Squeeze Hands - Performs Both Tasks + 0 2: Test Horizontal Extraocular Movements - Normal + 0 3: Test Visual Fields - Complete Hemianopia + 2 4: Test Facial Palsy (Use Grimace if Obtunded) - Normal symmetry + 0 5A: Test Left Arm Motor Drift - No Drift for 10 Seconds + 0 5B: Test Right Arm Motor Drift - No Drift for 10 Seconds + 0 6A: Test Left Leg Motor Drift - No Drift for 5 Seconds + 0 6B: Test Right Leg Motor Drift - No Drift for 5 Seconds + 0 7: Test Limb Ataxia (FNF/Heel-Shin) - No Ataxia + 0 8: Test Sensation - Normal; No sensory loss + 0 9:  Test Language/Aphasia - Normal; No aphasia + 0 10: Test Dysarthria - Normal + 0 11: Test Extinction/Inattention - No abnormality + 0  NIHSS Score: 2   Patient/Family was informed the Neurology Consult would occur via TeleHealth consult by way of interactive audio and video telecommunications and consented to receiving care in this manner.  Patient is being evaluated for possible acute neurologic impairment and high probability of imminent or life-threatening deterioration. I spent total of 35 minutes providing care to this patient, including time for face to face visit via telemedicine, review of medical records, imaging studies and discussion of findings with  providers, the patient and/or family.   Dr Natasha Bence Timithy Arons   [facility] [phone]  Case [id number]

## 2019-08-08 NOTE — Consult Note (Addendum)
Neurology Consultation  Reason for Consult: Right monocular vision loss  Referring Physician: Dr. Blinda Leatherwood  CC: Loss of vision in the right eye  History is obtained from: Patient  HPI: Debra Guerrero is a 34 y.o. 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  Patient presents the hospital secondary to loss of vision in her right eye that started yesterday.  Patient states that she had been having headache pain that she felt was "a migraine" over her right eye for approximately 1 week.  At that time she did have some blurry vision which gradually got worse.  During her headache she did have phonophobia and photophobia.  She cannot recall what her normal migraines usually were like as she has not had a migraine in many years.  She denies any other symptoms that are neurological.  At this time she states that she can see at times blotchy areas and at times outlines of figures but that is it.  Currently she is not having any pain with movement of her eyes.  She does have pain with light.  She even admits that she is scared to drive at this time.  ED course  Relevant labs include -white blood cell count of 16.8; however, she has received 1 dose of methylprednisolone.  Chart review -patient was previously seen by Dr. 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  Just prior to that visit she went to an optometrist for her annual exam and was found to have some type of abnormality of one of her eyes, which patient states was an asymptomatic "spot" seen by the optometrist on her eye exam. She was diagnosed with optic papillitis, unspecified laterality, but she feels that it was her right eye.  She was to see a retina specialist, but never was able to due to Covid restrictions.  Work up that has been done: CT head MRI with and without contrast of orbits MRI with and  without contrast of head   Past Medical History:  Diagnosis Date  . Asthma   . Depression   . Headache(784.0)   . 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 Seasonal allergies 09/11/2011    Family History  Problem Relation Age of Onset  . Asthma Mother   . Asthma Father   . Eczema Sister   . Allergic rhinitis Neg Hx   . Angioedema Neg Hx   . Atopy Neg Hx   . Immunodeficiency Neg Hx   . Urticaria Neg Hx    Social History:   reports that she has quit smoking. Her smoking use included cigarettes. She has never used smokeless tobacco. She reports current drug use. Drug: Marijuana. She reports that she does not drink alcohol.  Medications  Current Facility-Administered Medications:  .  diphenhydrAMINE (BENADRYL) injection 25 mg, 25 mg, Intravenous, Once, Mesner, Barbara Cower, MD  Current Outpatient Medications:  .  albuterol (PROVENTIL) (2.5 MG/3ML) 0.083% nebulizer solution, Take 3 mLs (2.5 mg total) by nebulization every 6 (six) hours as needed for wheezing or shortness of breath., Disp: 75 mL, Rfl: 12 .  albuterol (VENTOLIN HFA) 108 (90 Base) MCG/ACT inhaler, Inhale 2 puffs into the lungs every 4 (four) hours as needed for wheezing or shortness of breath., Disp: 18 g, Rfl: 1 .  cetirizine (ZYRTEC) 10 MG tablet, Take 1 tablet (10 mg total) by mouth daily., Disp: 30 tablet, Rfl: 5 .  Chlorhexidine Gluconate 2 %  SOLN, Use wash once daily for 1 week and then twice a week after., Disp: , Rfl:  .  enalapril (VASOTEC) 10 MG tablet, Take 1 tablet (10 mg total) by mouth daily. (Patient not taking: Reported on 08/04/2019), Disp: 30 tablet, Rfl: 11 .  EPINEPHrine 0.3 mg/0.3 mL IJ SOAJ injection, Use as directed for severe allergic reaction, Disp: 2 each, Rfl: 1 .  ibuprofen (ADVIL,MOTRIN) 800 MG tablet, Take 1 tablet (800 mg total) by mouth 3 (three) times daily., Disp: 21 tablet, Rfl: 0 .  montelukast (SINGULAIR) 10 MG tablet, TAKE 1 TABLET BY MOUTH EVERYDAY AT BEDTIME, Disp: 30 tablet, Rfl:  5 .  mupirocin ointment (BACTROBAN) 2 %, APPLY TO AFFECTED AREA 3 TIMES DAILY, Disp: , Rfl:  .  naproxen (NAPROSYN) 500 MG tablet, Take 500 mg by mouth 2 (two) times daily with a meal., Disp: , Rfl: 0 .  Olopatadine HCl 0.2 % SOLN, Apply 1 drop to eye daily as needed., Disp: 2.5 mL, Rfl: 5 .  SYMBICORT 160-4.5 MCG/ACT inhaler, TAKE 2 PUFFS BY MOUTH TWICE A DAY, Disp: 10.2 Inhaler, Rfl: 5  ROS:   General ROS: negative for - chills, fatigue, fever, night sweats, weight gain or weight loss Psychological ROS: negative for - behavioral disorder, hallucinations, memory difficulties, mood swings or suicidal ideation Ophthalmic ROS: Positive for -loss of vision in the right eye ENT ROS: negative for - epistaxis, nasal discharge, oral lesions, sore throat, tinnitus or vertigo Allergy and Immunology ROS: negative for - hives or itchy/watery eyes Hematological and Lymphatic ROS: negative for - bleeding problems, bruising or swollen lymph nodes Endocrine ROS: negative for - galactorrhea, hair pattern changes, polydipsia/polyuria or temperature intolerance Respiratory ROS: negative for - cough, hemoptysis, shortness of breath or wheezing Cardiovascular ROS: negative for - chest pain, dyspnea on exertion, edema or irregular heartbeat Gastrointestinal ROS: negative for - abdominal pain, diarrhea, hematemesis, nausea/vomiting or stool incontinence Genito-Urinary ROS: negative for - dysuria, hematuria, incontinence or urinary frequency/urgency Musculoskeletal ROS: negative for - joint swelling or muscular weakness Neurological ROS: as noted in HPI Dermatological ROS: negative for rash and skin lesion changes  Exam: Current vital signs: BP 103/61   Pulse 77   Temp 98.4 F (36.9 C) (Oral)   Resp 20   Ht 5\' 5"  (1.651 m)   Wt 107 kg   LMP 07/17/2019   SpO2 98%   BMI 39.25 kg/m  Vital signs in last 24 hours: Temp:  [98.4 F (36.9 C)-98.9 F (37.2 C)] 98.4 F (36.9 C) (06/01 0323) Pulse Rate:   [68-124] 77 (06/01 0800) Resp:  [18-20] 20 (06/01 0800) BP: (103-150)/(61-103) 103/61 (06/01 0800) SpO2:  [95 %-100 %] 98 % (06/01 0800) Weight:  11-21-1987 kg] 107 kg (05/31 2154)   Constitutional: Appears well-developed and well-nourished.  Psych: Affect appropriate to situation Eyes: No scleral injection HENT: No OP obstrucion Head: Normocephalic.  Cardiovascular: Normal rate and regular rhythm.  Respiratory: Effort normal, non-labored breathing GI: Soft.  No distension. There is no tenderness.  Skin: WDI  Neuro: Mental Status: Patient is awake, alert, oriented to person, place, month, year, and situation.  No dysarthria or aphasia.  Able to follow all instructions.  She became tearful when it was stated she may have to stay in the hospital as she has a 17-month-old baby at home.  Patient gives a clear history. Cranial Nerves: II: Left eye 20/25 visual acuity; with right eye she cannot read even the largest numbers on a Snellen card, but can make  out gross shapes, such as the examiner standing in front of her. There is constriction of the visual fields of her right eye.   III,IV, VI: EOMI without ptosis or diplopia.  Left eye has a briskly reactive pupil, right eye has a sluggish pupil but does react.  Positive right RAPD. V: Facial sensation is symmetric to temperature VII: Facial movement is symmetric.  VIII: hearing is intact to voice X: Palate elevates symmetrically XI: Shoulder shrug is symmetric. XII: tongue is midline without atrophy or fasciculations.  Motor: Tone is normal. Bulk is normal. 5/5 strength was present in all four extremities.  Sensory: Sensation is symmetric to light touch and temperature in the arms and legs. Deep Tendon Reflexes: 2+ and symmetric in the biceps and patellae.  Plantars: Toes are downgoing bilaterally.  Cerebellar: FNF and HKS are intact bilaterally  Labs I have reviewed labs in epic and the results pertinent to this consultation  are:  CBC    Component Value Date/Time   WBC 16.8 (H) 08/07/2019 2350   RBC 4.86 08/07/2019 2350   HGB 12.5 08/07/2019 2350   HGB 13.3 10/20/2017 1201   HCT 38.7 08/07/2019 2350   HCT 41.7 10/20/2017 1201   PLT 344 08/07/2019 2350   PLT 316 10/20/2017 1201   MCV 79.6 (L) 08/07/2019 2350   MCV 87 10/20/2017 1201   MCH 25.7 (L) 08/07/2019 2350   MCHC 32.3 08/07/2019 2350   RDW 15.1 08/07/2019 2350   RDW 13.6 10/20/2017 1201   LYMPHSABS 4.5 (H) 08/07/2019 2350   LYMPHSABS 3.6 (H) 10/20/2017 1201   MONOABS 0.8 08/07/2019 2350   EOSABS 0.1 08/07/2019 2350   EOSABS 0.2 10/20/2017 1201   BASOSABS 0.1 08/07/2019 2350   BASOSABS 0.0 10/20/2017 1201    CMP     Component Value Date/Time   NA 138 08/07/2019 2350   K 3.8 08/07/2019 2350   CL 103 08/07/2019 2350   CO2 26 08/07/2019 2350   GLUCOSE 108 (H) 08/07/2019 2350   BUN 10 08/07/2019 2350   CREATININE 0.57 08/07/2019 2350   CREATININE 0.56 04/15/2015 1329   CALCIUM 9.0 08/07/2019 2350   PROT 7.6 08/07/2019 2350   ALBUMIN 3.9 08/07/2019 2350   AST 13 (L) 08/07/2019 2350   ALT 18 08/07/2019 2350   ALKPHOS 93 08/07/2019 2350   BILITOT 0.5 08/07/2019 2350   GFRNONAA >60 08/07/2019 2350   GFRAA >60 08/07/2019 2350    Imaging I have reviewed the images obtained:  CT-scan of the brain-no acute intracranial abnormality  MRI examination of the brain with and without contrast: 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 Normal appearance of the brain, with no white matter disease.  MRI orbits with and without contrast-right retrobulbar optic neuritis  Etta Quill PA-C [facility] [phone] 08/08/2019, 8:55 AM     Assessment: 34 year old female presenting to the hospital with a 1 week history of right retroorbital and periorbital headache and progressive loss of vision OD.  1. Examination reveals RAPD and severely impaired visual acuity OD.  2. 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 No lesions are seen within the brain parenchyma.   Recommendations: 1.  IV Solu-Medrol 1000 mg qd x 5 days.  2.  Protonix 40 mg IV daily for gut protection 3.  Outpatient Neurology and Ophthalmology follow ups.   I have seen and examined the patient. I have formulated the assessment and recommendations. 34 year old female presenting to the hospital with a one-week history of right  retroorbital and periorbital headache and progressive loss of vision OD. Examination reveals right RAPD and severely impaired visual acuity OD. 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 Recommendations include pulsed-dose steroids x 5 days.  Electronically signed: Dr. Caryl Pina

## 2019-08-08 NOTE — ED Provider Notes (Signed)
  3:46 AM Patient has arrived from [facility] for MRI.  No change in symptoms since she left there--- still continues to have decreased vision in right eye.  States vision is not completely black, she can see some shades of gray/shadows but no clear visual picture.  Remains without eye pain.  Headache continues feeling better.  No dizziness/confusion.  Will let MRI know she is ready for scans.  6:35 AM MRI's still pending.  Unfortunately, they are very backed up.  Care will be signed out to oncoming provider to follow-up on scans.   Garlon Hatchet, PA-C 08/08/19 [id number]    Gilda Crease, MD 08/08/19 [phone]

## 2019-08-08 NOTE — ED Notes (Signed)
Pt found in lobby playing a game on her phone.  She reports she is able to see "shadows".  RN observed she is able to track w/ both eyes.

## 2019-08-08 NOTE — Discharge Planning (Signed)
RNCM to follow for disposition needs.  RNCM contacted home infusion rep for possible home IV infusion.

## 2019-08-08 NOTE — ED Notes (Signed)
Neuro at bedside.

## 2019-08-08 NOTE — ED Notes (Signed)
Pt to MRI

## 2019-08-08 NOTE — ED Notes (Signed)
Report given to: Rennis Golden, RN charge at [facility] ED.

## 2019-08-08 NOTE — ED Notes (Addendum)
Report given to: Darryll, RN with [facility]; eta 15-20 minutes.

## 2019-08-08 NOTE — ED Notes (Addendum)
Patient arrived with [facility] from [facility] for MRI , reports migraine headache and vision loss at right eye , patient stated migraine resolved but right eye vision loss prevails .

## 2019-08-09 ENCOUNTER — Emergency Department (HOSPITAL_COMMUNITY)
Admission: EM | Admit: 2019-08-09 | Discharge: 2019-08-09 | Disposition: A | Discharge status: Home / Self Care | Payer: Medicaid Other | Attending: Emergency Medicine | Admitting: Emergency Medicine

## 2019-08-09 ENCOUNTER — Encounter (HOSPITAL_COMMUNITY): Payer: Self-pay | Admitting: Emergency Medicine

## 2019-08-09 DIAGNOSIS — Y828 Other medical devices associated with adverse incidents: Secondary | ICD-10-CM | POA: Insufficient documentation

## 2019-08-09 DIAGNOSIS — T82594A Other mechanical complication of infusion catheter, initial encounter: Secondary | ICD-10-CM | POA: Insufficient documentation

## 2019-08-09 DIAGNOSIS — Z5321 Procedure and treatment not carried out due to patient leaving prior to being seen by health care provider: Secondary | ICD-10-CM | POA: Insufficient documentation

## 2019-08-09 NOTE — ED Notes (Signed)
Pt refused vitals and states she does not want to wait in lobby to see doctor see is going to leave without being seen.

## 2019-08-09 NOTE — ED Triage Notes (Signed)
Pt here today due to the IV she was sent home with yesterday to have soul medrol infusions is not working. Pt here today to have IV removed and replaced. Spoke with PA Sophia verbal order to replace saline lock so pt may continue to have IV transfusions until Saturday.

## 2019-08-09 NOTE — ED Provider Notes (Cosign Needed)
Called by triage RN regarding pt's plan. Pt seen yesterday for optic neuritis. She was d/c home with an IV and HH for solumedrol. Today IV infiltrated. Will have RN place IV while waiting to be seen.    Alveria Apley, PA-C 08/09/19 1854

## 2019-08-11 ENCOUNTER — Encounter (HOSPITAL_BASED_OUTPATIENT_CLINIC_OR_DEPARTMENT_OTHER): Payer: Self-pay

## 2019-08-11 ENCOUNTER — Emergency Department (HOSPITAL_COMMUNITY)
Admission: EM | Admit: 2019-08-11 | Discharge: 2019-08-11 | Discharge status: Against Medical Advice | Payer: Medicaid Other | Attending: Emergency Medicine | Admitting: Emergency Medicine

## 2019-08-11 ENCOUNTER — Emergency Department (HOSPITAL_BASED_OUTPATIENT_CLINIC_OR_DEPARTMENT_OTHER)
Admission: EM | Admit: 2019-08-11 | Discharge: 2019-08-11 | Disposition: A | Discharge status: Home / Self Care | Payer: Medicaid Other | Attending: Emergency Medicine | Admitting: Emergency Medicine

## 2019-08-11 ENCOUNTER — Encounter (HOSPITAL_COMMUNITY): Payer: Self-pay | Admitting: Emergency Medicine

## 2019-08-11 ENCOUNTER — Other Ambulatory Visit: Payer: Self-pay

## 2019-08-11 DIAGNOSIS — T829XXA Unspecified complication of cardiac and vascular prosthetic device, implant and graft, initial encounter: Secondary | ICD-10-CM

## 2019-08-11 DIAGNOSIS — J4540 Moderate persistent asthma, uncomplicated: Secondary | ICD-10-CM | POA: Insufficient documentation

## 2019-08-11 DIAGNOSIS — Z452 Encounter for adjustment and management of vascular access device: Secondary | ICD-10-CM | POA: Insufficient documentation

## 2019-08-11 DIAGNOSIS — Z87891 Personal history of nicotine dependence: Secondary | ICD-10-CM | POA: Insufficient documentation

## 2019-08-11 DIAGNOSIS — H469 Unspecified optic neuritis: Secondary | ICD-10-CM | POA: Insufficient documentation

## 2019-08-11 DIAGNOSIS — T82594A Other mechanical complication of infusion catheter, initial encounter: Secondary | ICD-10-CM | POA: Insufficient documentation

## 2019-08-11 DIAGNOSIS — Z5321 Procedure and treatment not carried out due to patient leaving prior to being seen by health care provider: Secondary | ICD-10-CM | POA: Diagnosis not present

## 2019-08-11 NOTE — ED Provider Notes (Signed)
[facility] EMERGENCY DEPARTMENT Provider Note   CSN: [id number] Arrival date & time: 08/11/19  2006     History Chief Complaint  Patient presents with  . IV insertion    Debra Guerrero is a 34 y.o. female.  34 year old female with past medical history below who presents requesting IV insertion.  Patient was recently diagnosed with optic neuritis after having vision changes and she was started on IV steroids which she has been receiving at home.  Today her IV stopped working and she presents requesting another IV to be placed so she can have her remaining 2 doses at home.  She does report improvement in her vision since starting the treatment.  The history is provided by the patient.       Past Medical History:  Diagnosis Date  . Asthma   . Depression   . Headache(784.0)   . 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 Seasonal allergies 09/11/2011    Patient Active Problem List   Diagnosis Date Noted  . Moderate persistent asthma, uncomplicated 08/04/2019  . Allergic conjunctivitis of both eyes 08/04/2019  . Urticaria 03/26/2015  . Moderate persistent asthma 03/26/2015  . 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 Encephalopathy acute 09/11/2011  . Tachycardia 09/11/2011  . Seasonal and perennial allergic rhinitis 09/11/2011  . Hypokalemia 09/11/2011  . Altered mental status 09/11/2011  . 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 MIGRAINE, COMMON, INTRACTABLE 04/25/2010    Past Surgical History:  Procedure Laterality Date  . NO PAST SURGERIES       OB History    Gravida  2   Para  2   Term  2   Preterm      AB      Living  2     SAB      TAB      Ectopic      Multiple      Live Births              Family History  Problem Relation Age of Onset  . Asthma Mother   . Asthma Father   . Eczema Sister   . Allergic rhinitis  Neg Hx   . Angioedema Neg Hx   . Atopy Neg Hx   . Immunodeficiency Neg Hx   . Urticaria Neg Hx     Social History   Tobacco Use  . Smoking status: Former Smoker    Types: Cigarettes  . Smokeless tobacco: Never Used  Substance Use Topics  . Alcohol use: No  . Drug use: Yes    Types: Marijuana    Home Medications Prior to Admission medications   Medication Sig Start Date End Date Taking? Authorizing Provider  acetaminophen (TYLENOL) 500 MG tablet Take 1,000 mg by mouth every 6 (six) hours as needed for mild pain.    [provider]  albuterol (PROVENTIL) (2.5 MG/3ML) 0.083% nebulizer solution Take 3 mLs (2.5 mg total) by nebulization every 6 (six) hours as needed for wheezing or shortness of breath. 10/27/18   Padgett, Pilar Grammes, MD  albuterol (VENTOLIN HFA) 108 (90 Base) MCG/ACT inhaler Inhale 2 puffs into the lungs every 4 (four) hours as needed for wheezing or shortness of breath. 10/12/18   Marcelyn Bruins, MD  aspirin-acetaminophen-caffeine (EXCEDRIN MIGRAINE) [phone] MG tablet Take 2 tablets by mouth every 6 (six) hours as needed for headache.  [provider]  cetirizine (ZYRTEC) 10 MG tablet Take 1 tablet (10 mg total) by mouth daily. 08/04/19   Kennith Gain, MD  Chlorhexidine Gluconate 2 % SOLN 15 mLs by Mouth Rinse route 2 (two) times a week.  02/15/18   [provider]  enalapril (VASOTEC) 10 MG tablet Take 1 tablet (10 mg total) by mouth daily. Patient not taking: Reported on 08/04/2019 03/13/19 03/12/20  Sparacino, Hailey L, DO  EPINEPHrine 0.3 mg/0.3 mL IJ SOAJ injection Use as directed for severe allergic reaction 08/04/19   Kennith Gain, MD  ibuprofen (ADVIL) 800 MG tablet Take 800 mg by mouth every 8 (eight) hours as needed for pain. 03/09/19   [provider]  ibuprofen (ADVIL,MOTRIN) 800 MG tablet Take 1 tablet (800 mg total) by mouth 3 (three) times daily. Patient not taking: Reported on  08/08/2019 08/29/17   Frederica Kuster, PA-C  methylPREDNISolone sodium succinate (SOLU-MEDROL) 1000 MG injection Inject 1,000 mg into the vein daily. 08/09/19   Petrucelli, Samantha R, PA-C  montelukast (SINGULAIR) 10 MG tablet TAKE 1 TABLET BY MOUTH EVERYDAY AT BEDTIME Patient taking differently: Take 10 mg by mouth at bedtime. TAKE 1 TABLET BY MOUTH EVERYDAY AT BEDTIME 08/04/19   Kennith Gain, MD  mupirocin ointment (BACTROBAN) 2 % Apply 1 application topically 3 (three) times daily.  06/13/15   [provider]  naproxen (NAPROSYN) 500 MG tablet Take 500 mg by mouth 2 (two) times daily with a meal. 10/08/16   [provider]  Olopatadine HCl 0.2 % SOLN Apply 1 drop to eye daily as needed. Patient taking differently: Place 1 drop into both eyes daily as needed (red or dry eyes).  08/04/19   Kennith Gain, MD  ondansetron (ZOFRAN-ODT) 8 MG disintegrating tablet Take 8 mg by mouth every 8 (eight) hours as needed for nausea/vomiting. 08/04/19   [provider]  pantoprazole (PROTONIX) 40 MG tablet Take 1 tablet (40 mg total) by mouth daily. 08/09/19   Petrucelli, Samantha R, PA-C  rizatriptan (MAXALT) 10 MG tablet Take 10 mg by mouth as directed. Take 10mg  at onset of headache. Repeat in 2 hours if needed. Max 2 doses in 24 hours 08/04/19   [provider]  SYMBICORT 160-4.5 MCG/ACT inhaler TAKE 2 PUFFS BY MOUTH TWICE A DAY Patient taking differently: Inhale 2 puffs into the lungs in the morning and at bedtime.  03/08/19   Kennith Gain, MD    Allergies    Shellfish allergy  Review of Systems   Review of Systems  Constitutional: Negative for fever.  Neurological: Negative for headaches.  Hematological: Does not bruise/bleed easily.    Physical Exam Updated Vital Signs BP (!) 159/96 (BP Location: Left Arm)   Pulse 68   Temp 99.1 F (37.3 C) (Oral)   Resp 18   Ht 5\' 5"  (1.651 m)   Wt 105.7 kg   LMP 07/17/2019   SpO2 100%   BMI  38.77 kg/m   Physical Exam Vitals and nursing note reviewed.  Constitutional:      General: She is not in acute distress.    Appearance: She is well-developed.  HENT:     Head: Normocephalic and atraumatic.  Eyes:     Conjunctiva/sclera: Conjunctivae normal.  Musculoskeletal:     Cervical back: Neck supple.  Skin:    General: Skin is warm and dry.  Neurological:     Mental Status: She is alert and oriented to person, place, and time.  Psychiatric:        Judgment: Judgment normal.     ED Results / Procedures / Treatments   Labs (all labs ordered are listed, but only abnormal results are displayed) Labs Reviewed - No data to display  EKG None  Radiology No results found.  Procedures Procedures (including critical care time)  Medications Ordered in ED Medications - No data to display  ED Course  I have reviewed the triage vital signs and the nursing notes.    MDM Rules/Calculators/A&P                      IV placed on R dorsal hand and secured. Pt counseled on IV care and already has home health set up for med administration. Final Clinical Impression(s) / ED Diagnoses Final diagnoses:  Problem with intravenous catheter    Rx / DC Orders ED Discharge Orders    None       Kiah Vanalstine, Ambrose Finland, MD 08/11/19 2306

## 2019-08-11 NOTE — ED Notes (Signed)
Patient was advised of plan for IV team to come and place IV; pt demanding time frame of which RN could not provide at this time but only ensure consult was placed; pt started crying and states she could not wait and did not understand why it could not be placed in her hand; pt walked out of exam room to ED exit; EDP notified that patient is gone-Monique,RN

## 2019-08-11 NOTE — ED Triage Notes (Signed)
Pt requesting to have IV inserted so she can complete at home steroid IV dose-NAD-steady gait-pt states she LWBS [place] due to wait time

## 2019-08-11 NOTE — ED Triage Notes (Signed)
Patient here to get her IV removed and another one placed so she can continue her at home solu medrol infusions. Patients IV that was placed here on 6/2 clotted.

## 2019-08-12 ENCOUNTER — Other Ambulatory Visit: Payer: Self-pay

## 2019-08-12 ENCOUNTER — Emergency Department (HOSPITAL_COMMUNITY)
Admission: EM | Admit: 2019-08-12 | Discharge: 2019-08-12 | Disposition: A | Discharge status: Home / Self Care | Payer: Medicaid Other | Attending: Emergency Medicine | Admitting: Emergency Medicine

## 2019-08-12 ENCOUNTER — Encounter (HOSPITAL_COMMUNITY): Payer: Self-pay

## 2019-08-12 DIAGNOSIS — Z87891 Personal history of nicotine dependence: Secondary | ICD-10-CM | POA: Diagnosis not present

## 2019-08-12 DIAGNOSIS — T829XXA Unspecified complication of cardiac and vascular prosthetic device, implant and graft, initial encounter: Secondary | ICD-10-CM | POA: Insufficient documentation

## 2019-08-12 DIAGNOSIS — Z79899 Other long term (current) drug therapy: Secondary | ICD-10-CM | POA: Insufficient documentation

## 2019-08-12 DIAGNOSIS — J45909 Unspecified asthma, uncomplicated: Secondary | ICD-10-CM | POA: Insufficient documentation

## 2019-08-12 DIAGNOSIS — H469 Unspecified optic neuritis: Secondary | ICD-10-CM | POA: Diagnosis not present

## 2019-08-12 NOTE — ED Triage Notes (Signed)
Pt arrives POV for IV placement for home steroids. Has been seen multiple times this week for same. No IV in place on arrival

## 2019-08-12 NOTE — ED Notes (Signed)
Rn attempted IV x2,attempt unsuccessful.

## 2019-08-12 NOTE — ED Provider Notes (Signed)
[facility] EMERGENCY DEPARTMENT Provider Note   CSN: [id number] Arrival date & time: 08/12/19  2140     History Chief Complaint  Patient presents with  . IV Placement    Debra Guerrero is a 34 y.o. female.  Debra history is provided by Debra patient and medical records.    34 y.o. F with hx of asthma, depression, headaches, schizophrenia, allergies, presenting to [facility] ED requesting IV placement.  She was seen Guerrero on 08/08/19 and diagnosed with optic neuritis.  States her eye is doing much better, she can see much more clearly now but still not completely back to baseline.  She has 1 dose of IV steroids left.  She did have IV placed yesterday, however she had to remove that for work today (she works for [employer]).  Past Medical History:  Diagnosis Date  . Asthma   . Depression   . Headache(784.0)   . 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 Seasonal allergies 09/11/2011    Patient Active Problem List   Diagnosis Date Noted  . Moderate persistent asthma, uncomplicated 08/04/2019  . Allergic conjunctivitis of both eyes 08/04/2019  . Urticaria 03/26/2015  . Moderate persistent asthma 03/26/2015  . 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 Encephalopathy acute 09/11/2011  . Tachycardia 09/11/2011  . Seasonal and perennial allergic rhinitis 09/11/2011  . Hypokalemia 09/11/2011  . Altered mental status 09/11/2011  . 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 MIGRAINE, COMMON, INTRACTABLE 04/25/2010    Past Surgical History:  Procedure Laterality Date  . NO PAST SURGERIES       OB History    Gravida  2   Para  2   Term  2   Preterm      AB      Living  2     SAB      TAB      Ectopic      Multiple      Live Births              Family History  Problem Relation Age of Onset  . Asthma Mother   . Asthma Father   .  Eczema Sister   . Allergic rhinitis Neg Hx   . Angioedema Neg Hx   . Atopy Neg Hx   . Immunodeficiency Neg Hx   . Urticaria Neg Hx     Social History   Tobacco Use  . Smoking status: Former Smoker    Types: Cigarettes  . Smokeless tobacco: Never Used  Substance Use Topics  . Alcohol use: No  . Drug use: Yes    Types: Marijuana    Home Medications Prior to Admission medications   Medication Sig Start Date End Date Taking? Authorizing Provider  acetaminophen (TYLENOL) 500 MG tablet Take 1,000 mg by mouth every 6 (six) hours as needed for mild pain.    [provider]  albuterol (PROVENTIL) (2.5 MG/3ML) 0.083% nebulizer solution Take 3 mLs (2.5 mg total) by nebulization every 6 (six) hours as needed for wheezing or shortness of breath. 10/27/18   Padgett, Pilar Grammes, MD  albuterol (VENTOLIN HFA) 108 (90 Base) MCG/ACT inhaler Inhale 2 puffs into Debra lungs every 4 (four) hours as needed for wheezing or shortness of breath. 10/12/18   Marcelyn Bruins, MD  aspirin-acetaminophen-caffeine (EXCEDRIN MIGRAINE) [phone] MG tablet Take 2 tablets by mouth every 6 (six)  hours as needed for headache.    [provider]  cetirizine (ZYRTEC) 10 MG tablet Take 1 tablet (10 mg total) by mouth daily. 08/04/19   Kennith Gain, MD  Chlorhexidine Gluconate 2 % SOLN 15 mLs by Mouth Rinse route 2 (two) times a week.  02/15/18   [provider]  enalapril (VASOTEC) 10 MG tablet Take 1 tablet (10 mg total) by mouth daily. Patient not taking: Reported on 08/04/2019 03/13/19 03/12/20  Sparacino, Hailey L, DO  EPINEPHrine 0.3 mg/0.3 mL IJ SOAJ injection Use as directed for severe allergic reaction 08/04/19   Kennith Gain, MD  ibuprofen (ADVIL) 800 MG tablet Take 800 mg by mouth every 8 (eight) hours as needed for pain. 03/09/19   [provider]  ibuprofen (ADVIL,MOTRIN) 800 MG tablet Take 1 tablet (800 mg total) by mouth 3 (three) times  daily. Patient not taking: Reported on 08/08/2019 08/29/17   Frederica Kuster, PA-C  methylPREDNISolone sodium succinate (SOLU-MEDROL) 1000 MG injection Inject 1,000 mg into Debra vein daily. 08/09/19   Petrucelli, Samantha R, PA-C  montelukast (SINGULAIR) 10 MG tablet TAKE 1 TABLET BY MOUTH EVERYDAY AT BEDTIME Patient taking differently: Take 10 mg by mouth at bedtime. TAKE 1 TABLET BY MOUTH EVERYDAY AT BEDTIME 08/04/19   Kennith Gain, MD  mupirocin ointment (BACTROBAN) 2 % Apply 1 application topically 3 (three) times daily.  06/13/15   [provider]  naproxen (NAPROSYN) 500 MG tablet Take 500 mg by mouth 2 (two) times daily with a meal. 10/08/16   [provider]  Olopatadine HCl 0.2 % SOLN Apply 1 drop to eye daily as needed. Patient taking differently: Place 1 drop into both eyes daily as needed (red or dry eyes).  08/04/19   Kennith Gain, MD  ondansetron (ZOFRAN-ODT) 8 MG disintegrating tablet Take 8 mg by mouth every 8 (eight) hours as needed for nausea/vomiting. 08/04/19   [provider]  pantoprazole (PROTONIX) 40 MG tablet Take 1 tablet (40 mg total) by mouth daily. 08/09/19   Petrucelli, Samantha R, PA-C  rizatriptan (MAXALT) 10 MG tablet Take 10 mg by mouth as directed. Take 10mg  at onset of headache. Repeat in 2 hours if needed. Max 2 doses in 24 hours 08/04/19   [provider]  SYMBICORT 160-4.5 MCG/ACT inhaler TAKE 2 PUFFS BY MOUTH TWICE A DAY Patient taking differently: Inhale 2 puffs into Debra lungs in Debra morning and at bedtime.  03/08/19   Kennith Gain, MD    Allergies    Shellfish allergy  Review of Systems   Review of Systems  Constitutional:       Needs IV  All other systems reviewed and are negative.   Physical Exam Updated Vital Signs BP (!) 145/100 (BP Location: Left Arm)   Pulse 84   Temp 98.6 F (37 C) (Oral)   Resp 16   LMP 07/17/2019   SpO2 100%   Physical Exam Vitals and nursing note  reviewed.  Constitutional:      Appearance: She is well-developed.  HENT:     Head: Normocephalic and atraumatic.  Eyes:     Conjunctiva/sclera: Conjunctivae normal.     Pupils: Pupils are equal, round, and reactive to light.  Cardiovascular:     Rate and Rhythm: Normal rate and regular rhythm.     Heart sounds: Normal heart sounds.  Pulmonary:     Effort: Pulmonary effort is normal. No respiratory distress.     Breath sounds: Normal  breath sounds. No rhonchi.  Abdominal:     General: Bowel sounds are normal.     Palpations: Abdomen is soft.     Tenderness: There is no abdominal tenderness. There is no rebound.  Musculoskeletal:        General: Normal range of motion.     Cervical back: Normal range of motion.  Skin:    General: Skin is warm and dry.  Neurological:     Mental Status: She is alert and oriented to person, place, and time.     ED Results / Procedures / Treatments   Labs (all labs ordered are listed, but only abnormal results are displayed) Labs Reviewed - No data to display  EKG None  Radiology No results found.  Procedures Procedures (including critical care time)  Medications Ordered in ED Medications - No data to display  ED Course  I have reviewed Debra triage vital signs and Debra nursing notes.  Pertinent labs & imaging results that were available during my care of Debra patient were reviewed by me and considered in my medical decision making (see chart for details).    MDM Rules/Calculators/A&P  34 y.o. F Guerrero requesting PIV placement.  She has new diagnosis of optic neuritis, receiving IV solu-medrol injections at home for 5 days, has 1 dose left.  Had to remove IV that was placed last night due to work today.  New IV placed Guerrero, home health RN will remove in Debra morning after her last dose.  She has been given neurology follow-up already.  She may return Guerrero for any new/acute changes.  Final Clinical Impression(s) / ED Diagnoses Final  diagnoses:  Problem with intravenous catheter    Rx / DC Orders ED Discharge Orders    None       Garlon Hatchet, PA-C 08/12/19 2309    Blane Ohara, MD 08/13/19 [phone]

## 2019-08-15 ENCOUNTER — Telehealth: Payer: Self-pay

## 2019-08-15 NOTE — Telephone Encounter (Signed)
Wheezing started when she start Solu-medrol on June 1st, when she was in the hospital for other medical purpose. She was on solu-medrol due vision issue at the hospital. Patient was taking this and other medication that was administered by the hospital. Patient stated they prescribed this to assist with her vision and other conditions and is causing her to have extreme breathing from taking this medication causing her asthma flare. She is not doing her Symbicort twice daily and only uses her Symbicort at night and only doing albuterol as needed hasn't been consent on her medication and state she is wondering if this is a lingering effect from the solu-medrol.

## 2019-08-15 NOTE — Telephone Encounter (Signed)
Patient called stating she is still wheezing after finishing out a steroid. She is using her Symbicort and rescue inhaler. Patient states she is using her rescue inhaler a lot today. She is also doing Singulair.  I asked the patient if she felt like she could not breath & if she needed to be seen right now. Patient feels it is manageable and that she does not need to be seen right now. She states she has a nebulizer at home and will try the nebulizer once she gets home from picking up her little ones.  Dr Delorse Lek is there anything else you recommend?   [facility] [address]  Thanks

## 2019-08-15 NOTE — Telephone Encounter (Signed)
Spoke with Dr. Delorse Lek via phone and she advise me if patient is having adverse reaction from Solu-medrol she need to be avoid any further prednisone at this time. She is needed to do her Symbicort 2 puff bid and continue Singulair as prescribed and albuterol as needed for shortness of breath or wheezing. Patient was advised to try this for a few days and to see if this helps and if not then to give [place] a call back to get a further evaluation on her wheezing.

## 2019-08-15 NOTE — Telephone Encounter (Signed)
Can we get more clarification?  How long has she had symptoms and what are they besides wheezing.   Her last visit was around the end of May 2021 and symptoms were controlled at that time and we did not provide with oral steroid to take at that time.   Will need to know if she went to an UC or her PCP and what steroid she took, strength and for how long and when completed.      Please ensure she is using her Symbicort 2 puffs twice a day with spacer at this time.

## 2019-08-16 ENCOUNTER — Ambulatory Visit: Payer: Medicaid Other | Admitting: Diagnostic Neuroimaging

## 2019-08-16 ENCOUNTER — Other Ambulatory Visit: Payer: Self-pay

## 2019-08-16 ENCOUNTER — Encounter: Payer: Self-pay | Admitting: Diagnostic Neuroimaging

## 2019-08-16 ENCOUNTER — Telehealth: Payer: Self-pay | Admitting: Diagnostic Neuroimaging

## 2019-08-16 VITALS — BP 124/74 | HR 76 | Ht 65.0 in | Wt 235.4 lb

## 2019-08-16 DIAGNOSIS — H4611 Retrobulbar neuritis, right eye: Secondary | ICD-10-CM

## 2019-08-16 NOTE — Progress Notes (Signed)
[facility]  PATIENT: Debra Guerrero DOB: 17-Jan-1986  REFERRING CLINICIAN: [facility] HISTORY FROM: patient  REASON FOR VISIT: new consult / existing patient    HISTORICAL  CHIEF COMPLAINT:  Chief Complaint  Patient presents with  . Optic Neuritis    rm 7 ED Referral, "my right eye vision isn't right"    HISTORY OF PRESENT ILLNESS:   UPDATE (08/16/19, VRP): Since last visit, lost to follow-up.  Patient did not hear about ophthalmology referral.  Patient did not have any eye related symptoms in 2020.  Then patient became pregnant and delivered baby 03/08/19.  Around 07/31/2019 patient had right sided headache with sensitivity to light and right eye pain.  Over the next week she developed some blurred vision in her right eye.  On 08/06/2019 she had significant vision loss.  She went to the hospital the next day.  She was evaluated with MRI of brain and orbits, diagnosed with acute right retrobulbar optic neuritis.  She started on IV Solu-Medrol and referred to home health for 5 days total therapy.  Since that time her vision has significantly improved.  It is 90% back to normal.  She has some mild blurred vision on the right side.  After finishing steroid she feels somewhat tired.  She feels like her asthma and allergies are slightly flared up. No numbness or weakness in her arms or legs.  No bowel or bladder difficulties.  No slurred speech or dizziness.  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  Patient denies any unilateral blurred vision, loss of vision, eye pain.  Patient went to optometrist for routine annual eye exam.  Apparently she had some type of "abnormality" of 1 of her eyes.  Patient does not remember which eye was affected.  Referral was placed by optometrist for "optic papillitis".  Patient does have history of some mild headaches.  No numbness or tingling.  No slurred speech or trouble talking.  No  facial pain or numbness.   REVIEW OF SYSTEMS: Full 14 system review of systems performed and negative with exception of: as per HPI.  ALLERGIES: Allergies  Allergen Reactions  . Shellfish Allergy Hives and Shortness Of Breath    HOME MEDICATIONS: Outpatient Medications Prior to Visit  Medication Sig Dispense Refill  . acetaminophen (TYLENOL) 500 MG tablet Take 1,000 mg by mouth every 6 (six) hours as needed for mild pain.    Marland Kitchen albuterol (PROVENTIL) (2.5 MG/3ML) 0.083% nebulizer solution Take 3 mLs (2.5 mg total) by nebulization every 6 (six) hours as needed for wheezing or shortness of breath. 75 mL 12  . albuterol (VENTOLIN HFA) 108 (90 Base) MCG/ACT inhaler Inhale 2 puffs into the lungs every 4 (four) hours as needed for wheezing or shortness of breath. 18 g 1  . aspirin-acetaminophen-caffeine (EXCEDRIN MIGRAINE) [phone] MG tablet Take 2 tablets by mouth every 6 (six) hours as needed for headache.    . cetirizine (ZYRTEC) 10 MG tablet Take 1 tablet (10 mg total) by mouth daily. 30 tablet 5  . Chlorhexidine Gluconate 2 % SOLN 15 mLs by Mouth Rinse route 2 (two) times a week.     Marland Kitchen EPINEPHrine 0.3 mg/0.3 mL IJ SOAJ injection Use as directed for severe allergic reaction 2 each 1  . ibuprofen (ADVIL) 800 MG tablet Take 800 mg by mouth every 8 (eight) hours as needed for pain.    . methylPREDNISolone sodium succinate (SOLU-MEDROL) 1000 MG injection Inject 1,000 mg into  the vein daily. 4 each 0  . montelukast (SINGULAIR) 10 MG tablet TAKE 1 TABLET BY MOUTH EVERYDAY AT BEDTIME (Patient taking differently: Take 10 mg by mouth at bedtime. TAKE 1 TABLET BY MOUTH EVERYDAY AT BEDTIME) 30 tablet 5  . mupirocin ointment (BACTROBAN) 2 % Apply 1 application topically 3 (three) times daily.     . Olopatadine HCl 0.2 % SOLN Apply 1 drop to eye daily as needed. (Patient taking differently: Place 1 drop into both eyes daily as needed (red or dry eyes). ) 2.5 mL 5  . rizatriptan (MAXALT) 10 MG tablet Take  10 mg by mouth as directed. Take 63m at onset of headache. Repeat in 2 hours if needed. Max 2 doses in 24 hours    . SYMBICORT 160-4.5 MCG/ACT inhaler TAKE 2 PUFFS BY MOUTH TWICE A DAY (Patient taking differently: Inhale 2 puffs into the lungs in the morning and at bedtime. ) 10.2 Inhaler 5  . enalapril (VASOTEC) 10 MG tablet Take 1 tablet (10 mg total) by mouth daily. (Patient not taking: Reported on 08/04/2019) 30 tablet 11  . pantoprazole (PROTONIX) 40 MG tablet Take 1 tablet (40 mg total) by mouth daily. (Patient not taking: Reported on 08/16/2019) 4 tablet 0  . ibuprofen (ADVIL,MOTRIN) 800 MG tablet Take 1 tablet (800 mg total) by mouth 3 (three) times daily. 21 tablet 0   No facility-administered medications prior to visit.    PAST MEDICAL HISTORY: Past Medical History:  Diagnosis Date  . Asthma   . Depression   . Headache(784.0)   . [place] ([place] 09/10[place]  . Seasonal allergies 09/11/2011    PAST SURGICAL HISTORY: Past Surgical History:  Procedure Laterality Date  . NO PAST SURGERIES      FAMILY HISTORY: Family History  Problem Relation Age of Onset  . Asthma Mother   . Asthma Father   . Eczema Sister   . Allergic rhinitis Neg Hx   . Angioedema Neg Hx   . Atopy Neg Hx   . Immunodeficiency Neg Hx   . Urticaria Neg Hx     SOCIAL HISTORY: Social History   Socioeconomic History  . Marital status: Married    Spouse name: Not on file  . Number of children: 1  . Years of education: Not on file  . Highest education level: Associate degree: academic program  Occupational History  . Not on file  Tobacco Use  . Smoking status: Former Smoker    Types: Cigarettes  . Smokeless tobacco: Never Used  Substance and Sexual Activity  . Alcohol use: No  . Drug use: Yes    Types: Marijuana  . Sexual activity: Not on file  Other Topics Concern  . Not on file  Social History Narrative   Lives with child   Caffeine- hardly any   Social Determinants of  Health   Financial Resource Strain:   . Difficulty of Paying Living Expenses:   Food Insecurity:   . Worried About RCharity fundraiserin the Last Year:   . RArboriculturistin the Last Year:   Transportation Needs:   . LFilm/video editor(Medical):   .Marland KitchenLack of Transportation (Non-Medical):   Physical Activity:   . Days of Exercise per Week:   . Minutes of Exercise per Session:   Stress:   . Feeling of Stress :   Social Connections:   . Frequency of Communication with Friends and Family:   . Frequency of Social Gatherings  with Friends and Family:   . Attends Religious Services:   . Active Member of Clubs or Organizations:   . Attends Archivist Meetings:   Marland Kitchen Marital Status:   Intimate Partner Violence:   . Fear of Current or Ex-Partner:   . Emotionally Abused:   Marland Kitchen Physically Abused:   . Sexually Abused:      PHYSICAL EXAM  GENERAL EXAM/CONSTITUTIONAL: Vitals:  Vitals:   08/16/19 1241  BP: 124/74  Pulse: 76  Weight: 235 lb 6.4 oz (106.8 kg)  Height: _0  (1.651 m)     Body mass index is 39.17 kg/m. Wt Readings from Last 3 Encounters:  08/16/19 235 lb 6.4 oz (106.8 kg)  08/11/19 233 lb (105.7 kg)  08/11/19 233 lb (105.7 kg)     Patient is in no distress; well developed, nourished and groomed; neck is supple  CARDIOVASCULAR:  Examination of carotid arteries is normal; no carotid bruits  Regular rate and rhythm, no murmurs  Examination of peripheral vascular system by observation and palpation is normal  EYES:  Ophthalmoscopic exam of optic discs and posterior segments is normal; no papilledema or hemorrhages  No exam data present  MUSCULOSKELETAL:  Gait, strength, tone, movements noted in Neurologic exam below  NEUROLOGIC: MENTAL STATUS:  No flowsheet data found.  awake, alert, oriented to person, place and time  recent and remote memory intact  normal attention and concentration  language fluent, comprehension intact,  naming intact  fund of knowledge appropriate  CRANIAL NERVE:   2nd - no papilledema on fundoscopic exam  2nd, 3rd, 4th, 6th - pupils equal and reactive to light, visual fields full to confrontation, extraocular muscles intact, no nystagmus  5th - facial sensation symmetric  7th - facial strength symmetric  8th - hearing intact  9th - palate elevates symmetrically, uvula midline  11th - shoulder shrug symmetric  12th - tongue protrusion midline  MOTOR:   normal bulk and tone, full strength in the BUE, BLE  SENSORY:   normal and symmetric to light touch, temperature, vibration  COORDINATION:   finger-nose-finger, fine finger movements normal  REFLEXES:   deep tendon reflexes present and symmetric  GAIT/STATION:   narrow based gait     DIAGNOSTIC DATA (LABS, IMAGING, TESTING) - I reviewed patient records, labs, notes, testing and imaging myself where available.  Lab Results  Component Value Date   WBC 16.8 (H) 08/07/2019   HGB 12.5 08/07/2019   HCT 38.7 08/07/2019   MCV 79.6 (L) 08/07/2019   PLT 344 08/07/2019      Component Value Date/Time   NA 138 08/07/2019 2350   K 3.8 08/07/2019 2350   CL 103 08/07/2019 2350   CO2 26 08/07/2019 2350   GLUCOSE 108 (H) 08/07/2019 2350   BUN 10 08/07/2019 2350   CREATININE 0.57 08/07/2019 2350   CREATININE 0.56 04/15/2015 1329   CALCIUM 9.0 08/07/2019 2350   PROT 7.6 08/07/2019 2350   ALBUMIN 3.9 08/07/2019 2350   AST 13 (L) 08/07/2019 2350   ALT 18 08/07/2019 2350   ALKPHOS 93 08/07/2019 2350   BILITOT 0.5 08/07/2019 2350   GFRNONAA >60 08/07/2019 2350   GFRAA >60 08/07/2019 2350   No results found for: CHOL, HDL, LDLCALC, LDLDIRECT, TRIG, CHOLHDL No results found for: HGBA1C Lab Results  Component Value Date   VITAMINB12 1,338 (H) 09/13/2011   Lab Results  Component Value Date   TSH 0.80 04/15/2015     08/08/19 MRI brain and orbits [I  reviewed images myself and agree with interpretation. -VRP]   1. Right retro bulbar optic neuritis. 2. Normal appearance of the brain; no white matter disease.    ASSESSMENT AND PLAN  34 y.o. year old female here with acute right retrobulbar optic neuritis end of May 2021.  Patient has responded well to IV Solu-Medrol.  Neurologic examination MRI of the brain are unremarkable.  MRI orbits does show acute right retrobulbar optic neuritis.  Could represent idiopathic optic neuritis versus association with other systemic autoimmune/inflammatory process.  We will proceed with further work-up with additional lab testing and MRI of the cervical and thoracic spine to rule out demyelinating disease or other conditions.   Dx:  1. Acute retrobulbar neuritis of right eye      PLAN:  RIGHT EYE RETROBULBAR OPTIC NEURITIS (07/31/19; s/p IV solumedrol x 5 doses) - check labs and MRI cervical / thoracic spine   Orders Placed This Encounter  Procedures  . MR CERVICAL SPINE W WO CONTRAST  . MR THORACIC SPINE W WO CONTRAST  . Neuromyelitis optica autoab, IgG  . ANA,IFA RA Diag Pnl w/rflx Tit/Patn  . Pan-ANCA  . Angiotensin converting enzyme  . Vitamin B12  . A1c  . TSH  . SPEP with IFE  . SSA, SSB  . ESR  . CRP  . HIV  . RPR  . Hepatitis B surface antibody, qualitative  . Hepatitis B surface antigen  . Hepatitis B core antibody, total  . Hepatitis C antibody  . Vitamin B1  . Vitamin B6   Return in about 6 months (around 02/15/2020).    Penni Bombard, MD 09/12/7339, 9:37 PM Certified in Neurology, Neurophysiology and Neuroimaging  [facility] [address] [place], [place] [address] [phone]

## 2019-08-16 NOTE — Telephone Encounter (Signed)
Medicaid order sent to GI. They will obtain the auth and reach out to the patient to schedule.  

## 2019-08-16 NOTE — Telephone Encounter (Signed)
Thank you.   Yes this is not the effect we would expect with solumedrol as steroids typically help with asthma symptoms however it appears she is having an opposite effect.  Thus would avoid systemic steroid at this time and see if just using her Symbicort 2 puffs twice a day and daily singulair will help to control her symptoms at this time.

## 2019-08-17 ENCOUNTER — Encounter (HOSPITAL_COMMUNITY): Payer: Self-pay

## 2019-08-17 ENCOUNTER — Telehealth: Payer: Self-pay | Admitting: Diagnostic Neuroimaging

## 2019-08-17 ENCOUNTER — Emergency Department (HOSPITAL_COMMUNITY): Payer: Medicaid Other

## 2019-08-17 ENCOUNTER — Emergency Department (HOSPITAL_COMMUNITY)
Admission: EM | Admit: 2019-08-17 | Discharge: 2019-08-17 | Disposition: A | Discharge status: Home / Self Care | Payer: Medicaid Other | Attending: Emergency Medicine | Admitting: Emergency Medicine

## 2019-08-17 DIAGNOSIS — R531 Weakness: Secondary | ICD-10-CM

## 2019-08-17 DIAGNOSIS — R079 Chest pain, unspecified: Secondary | ICD-10-CM | POA: Insufficient documentation

## 2019-08-17 DIAGNOSIS — Z87891 Personal history of nicotine dependence: Secondary | ICD-10-CM | POA: Insufficient documentation

## 2019-08-17 DIAGNOSIS — R5383 Other fatigue: Secondary | ICD-10-CM

## 2019-08-17 DIAGNOSIS — J45909 Unspecified asthma, uncomplicated: Secondary | ICD-10-CM | POA: Insufficient documentation

## 2019-08-17 LAB — BASIC METABOLIC PANEL
Anion gap: 11 (ref 5–15)
BUN: 6 mg/dL (ref 6–20)
CO2: 25 mmol/L (ref 22–32)
Calcium: 8.9 mg/dL (ref 8.9–10.3)
Chloride: 103 mmol/L (ref 98–111)
Creatinine, Ser: 0.55 mg/dL (ref 0.44–1.00)
GFR calc Af Amer: >60 mL/min (ref >60)
GFR calc non Af Amer: >60 mL/min (ref >60)
Glucose, Bld: 96 mg/dL (ref 70–99)
Potassium: 3.9 mmol/L (ref 3.5–5.1)
Sodium: 139 mmol/L (ref 135–145)

## 2019-08-17 LAB — URINALYSIS, ROUTINE W REFLEX MICROSCOPIC
Bilirubin Urine: NEGATIVE
Glucose, UA: NEGATIVE mg/dL
Hgb urine dipstick: NEGATIVE
Ketones, ur: NEGATIVE mg/dL
Leukocytes,Ua: NEGATIVE
Nitrite: NEGATIVE
Protein, ur: NEGATIVE mg/dL
Specific Gravity, Urine: 1.009 (ref 1.005–1.030)
pH: 8 (ref 5.0–8.0)

## 2019-08-17 LAB — CBC
HCT: 43.4 % (ref 36.0–46.0)
Hemoglobin: 13.8 g/dL (ref 12.0–15.0)
MCH: 26.2 pg (ref 26.0–34.0)
MCHC: 31.8 g/dL (ref 30.0–36.0)
MCV: 82.4 fL (ref 80.0–100.0)
Platelets: 354 10*3/uL (ref 150–400)
RBC: 5.27 MIL/uL — ABNORMAL HIGH (ref 3.87–5.11)
RDW: 15.9 % — ABNORMAL HIGH (ref 11.5–15.5)
WBC: 18.6 10*3/uL — ABNORMAL HIGH (ref 4.0–10.5)
nRBC: 0 % (ref 0.0–0.2)

## 2019-08-17 LAB — I-STAT BETA HCG BLOOD, ED (MC, WL, AP ONLY): I-stat hCG, quantitative: <5 m[IU]/mL (ref <5)

## 2019-08-17 MED ORDER — SODIUM CHLORIDE 0.9% FLUSH: 3.0000 mL | Freq: Once | INTRAVENOUS | Status: DC

## 2019-08-17 NOTE — ED Notes (Signed)
Pt ambulated to and from bathroom with a steady gait.

## 2019-08-17 NOTE — ED Triage Notes (Signed)
Patient arrived by [facility] for complaint of general weakness and fatigue since Saturday. Reports that she thinks related to the solumedrol that she finished over the weekend for an optic nerve issue. Alert and oriented

## 2019-08-17 NOTE — Telephone Encounter (Signed)
Pt called and LVM @ 10:08am stating that she is not feeling well and she is concerned and is wanting to speak to the RN. Please advise.

## 2019-08-17 NOTE — Telephone Encounter (Signed)
Noted, patient is in ED now.

## 2019-08-17 NOTE — ED Provider Notes (Signed)
[facility] Emergency Department Provider Note MRN:  [id number]  Arrival date & time: 08/17/19     Chief Complaint   Fatigue History of Present Illness   Debra Guerrero is a 34 y.o. year-old female with a history of schizophrenia, optic neuritis presenting to the ED with chief complaint of fatigue.  Patient has been feeling generally weak and fatigued today.  Endorsing intermittent chest pressure on the left side, mild in severity.  Denies shortness of breath, no leg pain or swelling, no abdominal pain, mild dull frontal headache on and off for the past day.  Feels very low energy.  Explains that she was on high-dose steroids for optic neuritis and completed this medication about 4 days ago.  Review of Systems  A complete 10 system review of systems was obtained and all systems are negative except as noted in the HPI and PMH.   Patient's Health History    Past Medical History:  Diagnosis Date  . Asthma   . Depression   . Headache(784.0)   . 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 Seasonal allergies 09/11/2011    Past Surgical History:  Procedure Laterality Date  . NO PAST SURGERIES      Family History  Problem Relation Age of Onset  . Asthma Mother   . Asthma Father   . Eczema Sister   . Allergic rhinitis Neg Hx   . Angioedema Neg Hx   . Atopy Neg Hx   . Immunodeficiency Neg Hx   . Urticaria Neg Hx     Social History   Socioeconomic History  . Marital status: Married    Spouse name: Not on file  . Number of children: 1  . Years of education: Not on file  . Highest education level: Associate degree: academic program  Occupational History  . Not on file  Tobacco Use  . Smoking status: Former Smoker    Types: Cigarettes  . Smokeless tobacco: Never Used  Vaping Use  . Vaping Use: Never used  Substance and Sexual Activity  . Alcohol use: No  . Drug use: Yes    Types: Marijuana  . Sexual activity: Not on file  Other Topics  Concern  . Not on file  Social History Narrative   Lives with child   Caffeine- hardly any   Social Determinants of Health   Financial Resource Strain:   . Difficulty of Paying Living Expenses:   Food Insecurity:   . Worried About Charity fundraiser in the Last Year:   . Arboriculturist in the Last Year:   Transportation Needs:   . Film/video editor (Medical):   Marland Kitchen Lack of Transportation (Non-Medical):   Physical Activity:   . Days of Exercise per Week:   . Minutes of Exercise per Session:   Stress:   . Feeling of Stress :   Social Connections:   . Frequency of Communication with Friends and Family:   . Frequency of Social Gatherings with Friends and Family:   . Attends Religious Services:   . Active Member of Clubs or Organizations:   . Attends Archivist Meetings:   Marland Kitchen Marital Status:   Intimate Partner Violence:   . Fear of Current or Ex-Partner:   . Emotionally Abused:   Marland Kitchen Physically Abused:   . Sexually Abused:      Physical Exam   Vitals:   08/17/19 1153  BP: 121/67  Pulse: 100  Resp: 16  Temp:  98.4 F (36.9 C)  SpO2: 99%    CONSTITUTIONAL: Well-appearing, NAD NEURO:  Alert and oriented x 3, no focal deficits EYES:  eyes equal and reactive ENT/NECK:  no LAD, no JVD CARDIO: Regular rate, well-perfused, normal S1 and S2 PULM:  CTAB no wheezing or rhonchi GI/GU:  normal bowel sounds, non-distended, non-tender MSK/SPINE:  No gross deformities, no edema SKIN:  no rash, atraumatic PSYCH:  Appropriate speech and behavior  *Additional and/or pertinent findings included in MDM below  Diagnostic and Interventional Summary    EKG Interpretation  Date/Time:  Thursday August 17 2019 14:32:57 EDT Ventricular Rate:  94 PR Interval:    QRS Duration: 79 QT Interval:  318 QTC Calculation: 398 R Axis:   -8 Text Interpretation: Sinus rhythm LVH by voltage No significant change was found Confirmed by Kennis Carina [phone]) on 08/17/2019 3:05:28 PM       Labs Reviewed  CBC - Abnormal; Notable for the following components:      Result Value   WBC 18.6 (*)    RBC 5.27 (*)    RDW 15.9 (*)    All other components within normal limits  URINALYSIS, ROUTINE W REFLEX MICROSCOPIC - Abnormal; Notable for the following components:   Color, Urine STRAW (*)    All other components within normal limits  BASIC METABOLIC PANEL  I-STAT BETA HCG BLOOD, ED ([facility], [facility], [facility] ONLY)    DG Chest Port 1 View  Final Result      Medications  sodium chloride flush (NS) 0.9 % injection 3 mL (3 mLs Intravenous Not Given 08/17/19 1448)     Procedures  /  Critical Care Procedures  ED Course and Medical Decision Making  I have reviewed the triage vital signs, the nursing notes, and pertinent available records from the EMR.  Listed above are laboratory and imaging tests that I personally ordered, reviewed, and interpreted and then considered in my medical decision making (see below for details).      Suspect patient is experiencing low energy due to recently stopping high-dose steroids.  She is very well-appearing on exam, in no acute distress, she has little to no risk factors for ACS.  No evidence of DVT, no shortness of breath, doubt pulmonary embolism.  Heart rate is between 90 and 100, she explains that she has not had anything to eat or drink today, will encourage hydration, follow-up labs and chest x-ray, reassess.  Anticipating discharge with reassurance.  Work-up is reassuring, appropriate for discharge.  Elmer Sow. Pilar Plate, MD [facility] Emergency Medicine [facility] [email or web address]  Final Clinical Impressions(s) / ED Diagnoses     ICD-10-CM   1. Weakness  R53.1   2. Fatigue, unspecified type  R53.83   3. Chest pain, unspecified type  R07.9     ED Discharge Orders    None       Discharge Instructions Discussed with and Provided to Patient:     Discharge Instructions     You were evaluated in the Emergency Department  and after careful evaluation, we did not find any emergent condition requiring admission or further testing in the hospital.  Your exam/testing today was overall reassuring.  Please return to the Emergency Department if you experience any worsening of your condition.  We encourage you to follow up with a primary care provider.  Thank you for allowing [place] to be a part of your care.        Sabas Sous, MD 08/17/19 [phone]

## 2019-08-17 NOTE — Telephone Encounter (Signed)
See other phone note dated today 

## 2019-08-17 NOTE — ED Notes (Signed)
Patient verbalizes understanding of discharge instructions. Opportunity for questioning and answers were provided. Armband removed by staff, pt discharged from ED to home via POV, alert and orientedx4, ambulatory

## 2019-08-17 NOTE — Telephone Encounter (Addendum)
12 :42 pm Message received:  Patient called and states she had an appointment with Dr. Marjory Lies yesterday. Patient is currently in the ED. She is a requesting a call as soon as possible. Called patient who is still in ED. She reported feeling weak all over, sharp pains in upper left back, left arm tingly, left leg feels like it may cramp, chest discomfort. She stated she wanted to be seen in office but didn't get a return call. I advised her that Dr Marjory Lies after seeing her yesterday ordered labs, some of which are pending, MRI scans to be done. I advised her she is in the right place for evaluation. If she needs urgent work up of any kind it can be done there, not in a office setting. She stated she wanted [place] to know, and I advised will send note to Dr Marjory Lies. She verbalized understanding, appreciation.

## 2019-08-17 NOTE — Telephone Encounter (Signed)
Patient called and states she had an appointment with Dr. Marjory Lies yesterday. Patient is currently in the ED. She is a requesting a call as soon as possible.

## 2019-08-17 NOTE — Discharge Instructions (Addendum)
You were evaluated in the Emergency Department and after careful evaluation, we did not find any emergent condition requiring admission or further testing in the hospital. ° °Your exam/testing today was overall reassuring. ° °Please return to the Emergency Department if you experience any worsening of your condition.  We encourage you to follow up with a primary care provider.  Thank you for allowing us to be a part of your care. ° °

## 2019-08-17 NOTE — ED Notes (Signed)
ED Provider at bedside. 

## 2019-08-18 ENCOUNTER — Telehealth: Payer: Self-pay | Admitting: Neurology

## 2019-08-18 MED ORDER — GABAPENTIN 100 MG PO CAPS: 100.0000 mg | ORAL_CAPSULE | Freq: Three times a day (TID) | ORAL | 1 refills | Status: DC

## 2019-08-18 NOTE — Telephone Encounter (Signed)
I called the patient.  Patient is having intermittent migratory tingling sensations, mostly on the left arm.  MRI of the cervical and thoracic spine has been ordered but not yet done.  The patient is being treated for an optic neuritis, MRI of the brain is unremarkable with exception of the findings of the optic neuritis.  The patient was placed on low-dose gabapentin for the sensory complaints.

## 2019-08-19 LAB — HEPATITIS C ANTIBODY: Hep C Virus Ab: <0.1 s/co ratio (ref 0.0–0.9)

## 2019-08-19 LAB — MULTIPLE MYELOMA PANEL, SERUM
Albumin SerPl Elph-Mcnc: 3.4 g/dL (ref 2.9–4.4)
Albumin/Glob SerPl: 1.1 (ref 0.7–1.7)
Alpha 1: 0.2 g/dL (ref 0.0–0.4)
Alpha2 Glob SerPl Elph-Mcnc: 1.1 g/dL — ABNORMAL HIGH (ref 0.4–1.0)
B-Globulin SerPl Elph-Mcnc: 1 g/dL (ref 0.7–1.3)
Gamma Glob SerPl Elph-Mcnc: 1 g/dL (ref 0.4–1.8)
Globulin, Total: 3.3 g/dL (ref 2.2–3.9)
IgA/Immunoglobulin A, Serum: 138 mg/dL (ref 87–352)
IgG (Immunoglobin G), Serum: 965 mg/dL (ref 586–1602)
IgM (Immunoglobulin M), Srm: 114 mg/dL (ref 26–217)
Total Protein: 6.7 g/dL (ref 6.0–8.5)

## 2019-08-19 LAB — ANGIOTENSIN CONVERTING ENZYME: Angio Convert Enzyme: 29 U/L (ref 14–82)

## 2019-08-19 LAB — HEPATITIS B SURFACE ANTIGEN: Hepatitis B Surface Ag: NEGATIVE

## 2019-08-19 LAB — RPR: RPR Ser Ql: NONREACTIVE

## 2019-08-19 LAB — ANA,IFA RA DIAG PNL W/RFLX TIT/PATN
ANA Titer 1: POSITIVE — AB
Cyclic Citrullin Peptide Ab: 3 units (ref 0–19)
Rheumatoid fact SerPl-aCnc: <10 IU/mL (ref 0.0–13.9)

## 2019-08-19 LAB — C-REACTIVE PROTEIN: CRP: 17 mg/L — ABNORMAL HIGH (ref 0–10)

## 2019-08-19 LAB — VITAMIN B1: Thiamine: 193.3 nmol/L (ref 66.5–200.0)

## 2019-08-19 LAB — FANA STAINING PATTERNS
Homogeneous Pattern: 1:80 {titer}
Speckled Pattern: 1:80 {titer}

## 2019-08-19 LAB — HEMOGLOBIN A1C
Est. average glucose Bld gHb Est-mCnc: 117 mg/dL
Hgb A1c MFr Bld: 5.7 % — ABNORMAL HIGH (ref 4.8–5.6)

## 2019-08-19 LAB — SJOGREN'S SYNDROME ANTIBODS(SSA + SSB)
ENA SSA (RO) Ab: <0.2 AI (ref 0.0–0.9)
ENA SSB (LA) Ab: <0.2 AI (ref 0.0–0.9)

## 2019-08-19 LAB — VITAMIN B6: Vitamin B6: 11.3 ug/L (ref 2.0–32.8)

## 2019-08-19 LAB — HEPATITIS B SURFACE ANTIBODY,QUALITATIVE: Hep B Surface Ab, Qual: NONREACTIVE

## 2019-08-19 LAB — NEUROMYELITIS OPTICA AUTOAB, IGG: NMO IgG Autoantibodies: <1.5 U/mL (ref 0.0–3.0)

## 2019-08-19 LAB — HEPATITIS B CORE ANTIBODY, TOTAL: Hep B Core Total Ab: NEGATIVE

## 2019-08-19 LAB — TSH: TSH: 0.741 u[IU]/mL (ref 0.450–4.500)

## 2019-08-19 LAB — SEDIMENTATION RATE: Sed Rate: 33 mm/hr — ABNORMAL HIGH (ref 0–32)

## 2019-08-19 LAB — HIV ANTIBODY (ROUTINE TESTING W REFLEX): HIV Screen 4th Generation wRfx: NONREACTIVE

## 2019-08-19 LAB — VITAMIN B12: Vitamin B-12: 1102 pg/mL (ref 232–1245)

## 2019-08-23 ENCOUNTER — Telehealth: Payer: Self-pay | Admitting: *Deleted

## 2019-08-23 NOTE — Telephone Encounter (Signed)
Spoke with patient and informed her labs are unremarkable with exception of a positive ANA. She asked what that means; I advised it may indicate autoimmune, however Dr Marjory Lies will wait on MRI results. I advised she call [facility] to schedule MRIs, gave her their #. Patient verbalized understanding, appreciation.

## 2019-08-30 ENCOUNTER — Encounter: Payer: Self-pay | Admitting: *Deleted

## 2019-08-30 NOTE — Telephone Encounter (Signed)
May increase gabapentin to 200mg  three times a day -VRP

## 2019-08-30 NOTE — Telephone Encounter (Signed)
Pt called and left a VM reporting that she is still experiencing discomfort and the gabapentin helped a little at first but now it is not. She would like a call back.

## 2019-08-30 NOTE — Telephone Encounter (Signed)
Sent Dr Richrd Humbles reply via my chart- phones are down.

## 2019-08-31 NOTE — Progress Notes (Signed)
[facility] [place] [address] Dept: [phone]  FOLLOW UP NOTE  Patient ID: Debra Guerrero, female    DOB: August 27, 1985  Age: 34 y.o. MRN: [id number] Date of Office Visit: 09/01/2019  Assessment  Chief Complaint: Allergy Testing (retest for shellfish allergy )  HPI Debra Guerrero is a 34 year old female who presents to the clinic for follow-up visit.  She was last seen in this clinic on 08/04/2019 by Dr. Nelva Bush for evaluation of asthma, allergic rhinitis, and food allergy to shellfish.  At today's visit she reports her asthma has been moderately well controlled with no shortness of breath or cough with activity or rest.  She does report wheezing about 3 days a week at random times during rest and activity that began several months ago.  She continues montelukast 10 mg once a day, Symbicort 160-2 puffs twice a day with a spacer, and albuterol 2-3 times a week.  She reports frequent episodes of heartburn for which she had previously taken ranitidine with relief of symptoms.  She is not currently taking any medication for reflux.  Allergic rhinitis is reported as well controlled with cetirizine 10 mg once a day.  She is not using Rhinocort or saline nasal spray at this time.  Allergic conjunctivitis is reported as well controlled with Pataday as needed.  She continues to avoid shellfish with no accidental ingestion or EpiPen use since her last visit to this clinic.  She is interested in retesting for this food allergy today.  She reports an area of muscle tension/pain occurring intermittently on the left side of her upper chest.  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  She reports that she has had this her current medications are in the chart.   Drug Allergies:  Allergies  Allergen Reactions  . Shellfish Allergy Hives and Shortness Of Breath    Physical Exam: BP 110/70 (BP Location: Left Arm, Patient  Position: Sitting, Cuff Size: Large)   Pulse (!) 103   Temp 97.7 F (36.5 C) (Temporal)   Resp 18   LMP 08/14/2019 (Approximate)    Physical Exam Vitals reviewed.  Constitutional:      Appearance: Normal appearance.  HENT:     Head: Normocephalic and atraumatic.     Right Ear: Tympanic membrane normal.     Left Ear: Tympanic membrane normal.     Nose:     Comments: Bilateral nares slightly erythematous with clear nasal drainage noted.  Pharynx normal.  Ears normal.  Eyes normal.    Mouth/Throat:     Pharynx: Oropharynx is clear.  Eyes:     Conjunctiva/sclera: Conjunctivae normal.  Cardiovascular:     Rate and Rhythm: Normal rate and regular rhythm.     Heart sounds: Normal heart sounds. No murmur heard.   Pulmonary:     Effort: Pulmonary effort is normal.     Breath sounds: Normal breath sounds.     Comments: Lungs clear to auscultation Musculoskeletal:        General: Normal range of motion.     Cervical back: Normal range of motion and neck supple.  Skin:    General: Skin is warm and dry.  Neurological:     Mental Status: She is alert and oriented to person, place, and time.  Psychiatric:        Mood and Affect: Mood normal.        Behavior: Behavior normal.  Thought Content: Thought content normal.        Judgment: Judgment normal.     Diagnostics: Select foods panel was positive to shellfish mix, shrimp, crab, lobster and negative to oyster and scallop with adequate controls.  Assessment and Plan: 1. Moderate persistent asthma, uncomplicated   2. Seasonal and perennial allergic rhinitis   3. Allergic conjunctivitis of both eyes   4. Allergy with anaphylaxis due to food, subsequent encounter   5. Gastroesophageal reflux disease, unspecified whether esophagitis present     Meds ordered this encounter  Medications  . famotidine (PEPCID) 20 MG tablet    Sig: Take 1 tablet (20 mg total) by mouth 2 (two) times daily.    Dispense:  64 tablet    Refill:   2    Patient Instructions  Asthma Continue Symbicort 160/4.5 mcg - 2 puffs twice a day with spacer.  Rinse mouth out afterward to prevent thrush. Continue Singulair 10 mg once a day to help prevent cough and wheeze. May use Ventolin 2 puffs every 4 hours as needed for coughing, wheezing, tightness in chest or shortness of breath.  Reflux Begin famotidine 20 mg twice a day for reflux. This may help decrease your wheeze Begin dietary and lifestyle modifications as listed below  Seasonal and perennial allergic rhinitis Continue cetirizine 10 mg once a day as needed for runny nose or itching. May use Rhinocort nasal spray 1 to 2 sprays per nostril once a day as needed for nasal congestion. 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 You had negative results to oyster and scallop. Continue to avoid all shellfish for now.We will order some lab tests to help [place] with you food allergies. We will call you when these become available. In case of an allergic reaction, give Benadryl 4 teaspoonfuls every 4 hours, and if life-threatening symptoms occur, inject with EpiPen 0.3 mg. Return for skin testing to shellfish.    Please let us know if this treatment plan is not working well for you.  Follow up in 2 months or sooner if needed.   Return in about 2 months (around 11/01/2019), or if symptoms worsen or fail to improve.    Thank you for the opportunity to care for this patient.  Please do not hesitate to contact me with questions.  Thermon Leyland, FNP [facility]

## 2019-08-31 NOTE — Patient Instructions (Signed)
Asthma Continue Symbicort 160/4.5 mcg - 2 puffs twice a day with spacer.  Rinse mouth out afterward to prevent thrush. Continue Singulair 10 mg once a day to help prevent cough and wheeze. May use Ventolin 2 puffs every 4 hours as needed for coughing, wheezing, tightness in chest or shortness of breath.  Reflux Begin famotidine 20 mg twice a day for reflux. This may help decrease your wheeze Begin dietary and lifestyle modifications as listed below  Seasonal and perennial allergic rhinitis Continue cetirizine 10 mg once a day as needed for runny nose or itching. May use Rhinocort nasal spray 1 to 2 sprays per nostril once a day as needed for nasal congestion. 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 You had negative results to oyster and scallop. Continue to avoid all shellfish for now.We will order some lab tests to help [place] with you food allergies. We will call you when these become available. In case of an allergic reaction, give Benadryl 4 teaspoonfuls every 4 hours, and if life-threatening symptoms occur, inject with EpiPen 0.3 mg. Return for skin testing to shellfish.    Please let us know if this treatment plan is not working well for you.  Follow up in 2 months or sooner if needed.

## 2019-09-01 ENCOUNTER — Ambulatory Visit (INDEPENDENT_AMBULATORY_CARE_PROVIDER_SITE_OTHER): Payer: Medicaid Other | Admitting: Family Medicine

## 2019-09-01 ENCOUNTER — Other Ambulatory Visit: Payer: Self-pay

## 2019-09-01 ENCOUNTER — Encounter: Payer: Self-pay | Admitting: Family Medicine

## 2019-09-01 VITALS — BP 110/70 | HR 103 | Temp 97.7°F | Resp 18

## 2019-09-01 DIAGNOSIS — J4540 Moderate persistent asthma, uncomplicated: Secondary | ICD-10-CM

## 2019-09-01 DIAGNOSIS — T7800XD Anaphylactic reaction due to unspecified food, subsequent encounter: Secondary | ICD-10-CM

## 2019-09-01 DIAGNOSIS — K219 Gastro-esophageal reflux disease without esophagitis: Secondary | ICD-10-CM

## 2019-09-01 DIAGNOSIS — H1013 Acute atopic conjunctivitis, bilateral: Secondary | ICD-10-CM

## 2019-09-01 DIAGNOSIS — J3089 Other allergic rhinitis: Secondary | ICD-10-CM | POA: Diagnosis not present

## 2019-09-01 MED ORDER — FAMOTIDINE 20 MG PO TABS: 20.0000 mg | ORAL_TABLET | Freq: Two times a day (BID) | ORAL | 2 refills | Status: DC

## 2019-09-01 NOTE — Telephone Encounter (Signed)
I spoke to her this afternoon.  She is reporting that the gabapentin at 100 mg 3 times a day has helped slightly.  She did not get the message from Wylie Hail to increase to 200 mg 3 times a day.  I relayed the message and she will start the higher dose.

## 2019-09-08 ENCOUNTER — Other Ambulatory Visit: Payer: Self-pay | Admitting: Neurology

## 2019-09-08 MED ORDER — GABAPENTIN 100 MG PO CAPS
200.0000 mg | ORAL_CAPSULE | Freq: Three times a day (TID) | ORAL | 1 refills | Status: DC
Start: 2019-09-08 — End: 2019-09-27

## 2019-09-08 NOTE — Progress Notes (Signed)
I received a call from answering service that patient was requesting a prescription for gabapentin 200 mg 3 times daily which Dr. Laurita Quint had suggested.  I reviewed the patient's chart and updated her prescription and sent it to her stated pharmacy.  I called the patient and left a message on her answering machine regarding this.

## 2019-09-27 ENCOUNTER — Telehealth: Payer: Self-pay | Admitting: Diagnostic Neuroimaging

## 2019-09-27 ENCOUNTER — Encounter: Payer: Self-pay | Admitting: Family Medicine

## 2019-09-27 ENCOUNTER — Ambulatory Visit (INDEPENDENT_AMBULATORY_CARE_PROVIDER_SITE_OTHER): Payer: Medicaid Other | Admitting: Family Medicine

## 2019-09-27 ENCOUNTER — Other Ambulatory Visit: Payer: Self-pay

## 2019-09-27 DIAGNOSIS — J4541 Moderate persistent asthma with (acute) exacerbation: Secondary | ICD-10-CM

## 2019-09-27 DIAGNOSIS — K219 Gastro-esophageal reflux disease without esophagitis: Secondary | ICD-10-CM

## 2019-09-27 DIAGNOSIS — J3089 Other allergic rhinitis: Secondary | ICD-10-CM | POA: Diagnosis not present

## 2019-09-27 DIAGNOSIS — H1013 Acute atopic conjunctivitis, bilateral: Secondary | ICD-10-CM

## 2019-09-27 MED ORDER — OMEPRAZOLE 40 MG PO CPDR: 40.0000 mg | DELAYED_RELEASE_CAPSULE | Freq: Every day | ORAL | 5 refills | Status: DC

## 2019-09-27 MED ORDER — GABAPENTIN 300 MG PO CAPS: 300.0000 mg | ORAL_CAPSULE | Freq: Three times a day (TID) | ORAL | 6 refills | Status: DC

## 2019-09-27 NOTE — Telephone Encounter (Signed)
Meds ordered this encounter  Medications  . gabapentin (NEURONTIN) 300 MG capsule    Sig: Take 1 capsule (300 mg total) by mouth 3 (three) times daily.    Dispense:  90 capsule    Refill:  6   Suanne Marker, MD 09/27/2019, 2:52 PM Certified in Neurology, Neurophysiology and Neuroimaging  [facility] [address] [place], [place] [address] [phone]

## 2019-09-27 NOTE — Telephone Encounter (Signed)
Spoke with patient who is taking gabapentin 200 mg three x daily,  I asked what symptoms she's having. She reported continued burning sensations in random places of body, could not give frequency. She stated they are same symptoms she reported to Dr Anne Hahn. I advised will discuss with MD and call her back. Patient verbalized understanding, appreciation.

## 2019-09-27 NOTE — Progress Notes (Addendum)
RE: Debra Guerrero MRN: [id number] DOB: March 22, 1985 Date of Telemedicine Visit: 09/27/2019  Referring provider: [facility] Primary care provider: [facility]  Chief Complaint: Gastroesophageal Reflux (x 2-3 weeks) and Chest Pain (burning pressure )   Telemedicine Follow Up Visit via Telephone: I connected with [name] for a follow up on 09/27/19 by telephone and verified that I am speaking with the correct person using two identifiers.   I discussed the limitations, risks, security and privacy concerns of performing an evaluation and management service by telephone and the availability of in person appointments. I also discussed with the patient that there may be a patient responsible charge related to this service. The patient expressed understanding and agreed to proceed.  Patient is at home  Provider is at the office.  Visit start time: 11:05 Visit end time: 62 Insurance consent/check in by: Fleet Contras Medical consent and medical assistant/nurse: Logan  History of Present Illness: She is a 34 y.o. female, who is being followed for asthma, allergic rhinitis, allergic conjunctivitis, reflux, and food allergy to shellfish. Her previous allergy office visit was on 09/01/2019 with Thermon Leyland, FNP.  At today's visit she reports, her asthma has not been well controlled with symptoms including shortness of breath and wheeze with activity or rest and cough that has been both dry and producing clear sputum. She continues montelukast 10 mg once a day, Symbicort 160-2 puffs twice a day with a spacer, and using albuterol more frequently over the last few weeks.  Reflux is reported as not well controlled with symptoms including intermittent burning pressure in her chest that started occurs and resolves completely before occurring again.  She reports that she is experiencing this feeling both day and night, however, more frequently at night.  She  continues famotidine 20 mg twice a day with no relief of symptoms.  She does report frequently drinking carbonated beverages.  Allergic rhinitis is reported as well controlled with cetirizine 10 mg once a day and Rhinocort as needed.  She continues to avoid shellfish with no accidental ingestion or EpiPen use since her last visit to this clinic.  Her current medications are listed in the chart.   Assessment and Plan: [name] is a 34 y.o. female with: Patient Instructions  Asthma Stop Symbicort and begin Breztri - 2 puffs twice a day with spacer.  Rinse mouth out afterward to prevent thrush. Continue Singulair 10 mg once a day to help prevent cough and wheeze. May use Ventolin 2 puffs every 4 hours as needed for coughing, wheezing, tightness in chest or shortness of breath. You may use albuterol 2 puffs 5-15 minutes before activity to decrease cough or wheeze  Reflux Begin omeprazole 40 mg once a day. Take this medication 30 minutes before your first meal of the day Continue famotidine 20 mg twice a day for reflux. This may help decrease your wheeze Begin dietary and lifestyle modifications as listed below If the burning in your chest worsens or begins to become painful seek immediate treatment by calling 911  Seasonal and perennial allergic rhinitis Continue cetirizine 10 mg once a day as needed for runny nose or itching. May use Rhinocort nasal spray 1 to 2 sprays per nostril once a day as needed for nasal congestion. 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 In case of an allergic reaction, give Benadryl 4 teaspoonfuls every 4 hours, and if life-threatening symptoms occur, inject with EpiPen 0.3 mg.  Please let us know if this  treatment plan is not working well for you.  Follow up in 1 month in the clinic or sooner if needed.    Return in about 4 weeks (around 10/25/2019), or if symptoms worsen or fail to improve.  Meds ordered this encounter  Medications  . omeprazole (PRILOSEC) 40 MG capsule    Sig: Take 1 capsule (40 mg total) by mouth daily. Take 1 tablet 30 minutes before your first meal of the day.    Dispense:  30 capsule    Refill:  5   Lab Orders  No laboratory test(s) ordered today    Diagnostics: None.  Medication List:  Current Outpatient Medications  Medication Sig Dispense Refill  . acetaminophen (TYLENOL) 500 MG tablet Take 1,000 mg by mouth every 6 (six) hours as needed for mild pain.    Marland Kitchen albuterol (PROVENTIL) (2.5 MG/3ML) 0.083% nebulizer solution Take 3 mLs (2.5 mg total) by nebulization every 6 (six) hours as needed for wheezing or shortness of breath. 75 mL 12  . albuterol (VENTOLIN HFA) 108 (90 Base) MCG/ACT inhaler Inhale 2 puffs into the lungs every 4 (four) hours as needed for wheezing or shortness of breath. 18 g 1  . aspirin-acetaminophen-caffeine (EXCEDRIN MIGRAINE) 250-250-65 MG tablet Take 2 tablets by mouth every 6 (six) hours as needed for headache.    . cetirizine (ZYRTEC) 10 MG tablet Take 1 tablet (10 mg total) by mouth daily. 30 tablet 5  . Chlorhexidine Gluconate 2 % SOLN 15 mLs by Mouth Rinse route 2 (two) times daily as needed (pain).     Marland Kitchen EPINEPHrine 0.3 mg/0.3 mL IJ SOAJ injection Use as directed for severe allergic reaction 2 each 1  . famotidine (PEPCID) 20 MG tablet Take 1 tablet (20 mg total) by mouth 2 (two) times daily. 64 tablet 2  . gabapentin (NEURONTIN) 100 MG capsule Take 2 capsules (200 mg total) by mouth 3 (three) times daily. 180 capsule 1  . ibuprofen (ADVIL) 800 MG tablet Take 800 mg by mouth every 8 (eight) hours as needed for pain.     . montelukast (SINGULAIR) 10 MG tablet TAKE 1 TABLET BY MOUTH EVERYDAY AT BEDTIME (Patient taking differently:  Take 10 mg by mouth at bedtime. TAKE 1 TABLET BY MOUTH EVERYDAY AT BEDTIME) 30 tablet 5  . mupirocin ointment (BACTROBAN) 2 % Apply 1 application topically 3 (three) times daily as needed (rash).     . Olopatadine HCl 0.2 % SOLN Apply 1 drop to eye daily as needed. (Patient taking differently: Place 1 drop into both eyes daily as needed (red or dry eyes). ) 2.5 mL 5  . rizatriptan (MAXALT) 10 MG tablet Take 10 mg by mouth as directed. Take 10mg  at onset of headache. Repeat in 2 hours if needed. Max 2 doses in 24 hours    . SYMBICORT 160-4.5 MCG/ACT inhaler TAKE 2 PUFFS BY MOUTH TWICE A DAY (Patient taking differently: Inhale 2 puffs into the lungs in the morning and at bedtime. ) 10.2 Inhaler 5  . methylPREDNISolone sodium succinate (SOLU-MEDROL) 1000 MG injection Inject 1,000 mg into the vein daily. (Patient not taking: Reported on 09/27/2019) 4 each 0  . omeprazole (PRILOSEC) 40 MG capsule Take 1 capsule (40 mg total)  by mouth daily. Take 1 tablet 30 minutes before your first meal of the day. 30 capsule 5  . pantoprazole (PROTONIX) 40 MG tablet Take 1 tablet (40 mg total) by mouth daily. (Patient not taking: Reported on 09/27/2019) 4 tablet 0   No current facility-administered medications for this visit.   Allergies: Allergies  Allergen Reactions  . Shellfish Allergy Hives and Shortness Of Breath   I reviewed her past medical history, social history, family history, and environmental history and no significant changes have been reported from previous visit on 09/01/2019.  Objective: Physical Exam Not obtained as encounter was done via telephone.   Previous notes and tests were reviewed.  I discussed the assessment and treatment plan with the patient. The patient was provided an opportunity to ask questions and all were answered. The patient agreed with the plan and demonstrated an understanding of the instructions.   The patient was advised to call back or seek an in-person evaluation if the  symptoms worsen or if the condition fails to improve as anticipated.  I provided 40 minutes of non-face-to-face time during this encounter.  It was my pleasure to participate in Bufalo Gunnerson's care today. Please feel free to contact me with any questions or concerns.   Sincerely,  Thermon Leyland, FNP  ________________________________________________  I have provided oversight concerning [facility]'s evaluation and treatment of this patient's health issues addressed during today's encounter.  I agree with the assessment and therapeutic plan as outlined in the note.   Signed,   R Jorene Guest, MD

## 2019-09-27 NOTE — Telephone Encounter (Signed)
Pt called having pain gabapentin (NEURONTIN) 100 MG capsule not working. Want to know if dosage need to be increased.

## 2019-09-27 NOTE — Telephone Encounter (Signed)
Called patient and informed her of new x sent in. Patient verbalized understanding, appreciation.

## 2019-09-27 NOTE — Patient Instructions (Addendum)
Asthma Stop Symbicort and begin Breztri - 2 puffs twice a day with spacer.  Rinse mouth out afterward to prevent thrush. Continue Singulair 10 mg once a day to help prevent cough and wheeze. May use Ventolin 2 puffs every 4 hours as needed for coughing, wheezing, tightness in chest or shortness of breath. You may use albuterol 2 puffs 5-15 minutes before activity to decrease cough or wheeze  Reflux Begin omeprazole 40 mg once a day. Take this medication 30 minutes before your first meal of the day Continue famotidine 20 mg twice a day for reflux. This may help decrease your wheeze Begin dietary and lifestyle modifications as listed below If the burning in your chest worsens or begins to become painful seek immediate treatment by calling 911  Seasonal and perennial allergic rhinitis Continue cetirizine 10 mg once a day as needed for runny nose or itching. May use Rhinocort nasal spray 1 to 2 sprays per nostril once a day as needed for nasal congestion. 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 In case of an allergic reaction, give Benadryl 4 teaspoonfuls every 4 hours, and if life-threatening symptoms occur, inject with EpiPen 0.3 mg.  Please let us know if this treatment plan is not working well for you.  Follow up in 1 month in the clinic or sooner if needed.   Lifestyle Changes for Controlling GERD When you have GERD, stomach acid feels as if it's backing up toward your mouth. Whether or not you take medication to control your GERD, your symptoms can often be improved with lifestyle changes.   Raise Your Head  Reflux is more likely to strike when you're lying down flat, because stomach fluid can  flow backward more easily.  Raising the head of your bed 4-6 inches can help. To do this:  Slide blocks or books under the legs at the head of your bed. Or, place a wedge under  the mattress. Many foam stores can make a suitable wedge for you. The wedge  should run from your waist to the top of your head.  Don't just prop your head on several pillows. This increases pressure on your  stomach. It can make GERD worse.  Watch Your Eating Habits Certain foods may increase the acid in your stomach or relax the lower esophageal sphincter, making GERD more likely. It's best to avoid the following:  Coffee, tea, and carbonated drinks (with and without caffeine)  Fatty, fried, or spicy food  Mint, chocolate, onions, and tomatoes  Any other foods that seem to irritate your stomach or cause you pain  Relieve the Pressure  Eat smaller meals, even if you have to eat more often.  Don't lie down right after you eat. Wait a few hours for your stomach to empty.  Avoid tight belts and tight-fitting clothes.  Lose excess weight.  Tobacco and Alcohol  Avoid smoking tobacco and drinking alcohol. They can make GERD symptoms worse.

## 2019-09-28 ENCOUNTER — Other Ambulatory Visit: Payer: Self-pay | Admitting: Diagnostic Neuroimaging

## 2019-09-30 ENCOUNTER — Other Ambulatory Visit: Payer: Medicaid Other

## 2019-10-02 ENCOUNTER — Telehealth: Payer: Self-pay

## 2019-10-02 NOTE — Telephone Encounter (Signed)
-----   Message from Hetty Blend, FNP sent at 10/02/2019  1:19 PM EDT ----- Can you please call this patient and ask if her chest pain/tightness has resolved and if she is still taking omeprazole. Thank you

## 2019-10-02 NOTE — Telephone Encounter (Signed)
Spoke with patient. She is still having some problems. Wheezing and pain on her left side. Using her rescue inhaler more. She is also taking omeprazole 30 minutes prior to breakfast.

## 2019-10-02 NOTE — Telephone Encounter (Signed)
Talked to patient via phone. She reports that her chest has improved some with the addition of omeprazole, however, she continues to experience some chest tightness and wheeze for which she uses albuterol with resolution of symptoms. She reports that she has an appointment with another MD on Wednesday and she will reach out to [place] if we can do anything for her. She will call the clinic or 911 with any worsening symptoms or if her symptoms do not improve.

## 2019-10-02 NOTE — Telephone Encounter (Signed)
Debra Guerrero, Norvel Richards, FNP  [facility] Can you please call this patient and ask if her chest pain/tightness has resolved and if she is still taking omeprazole. Thank you   tried calling pt someone else had done called her

## 2019-10-03 ENCOUNTER — Ambulatory Visit: Payer: Medicaid Other | Admitting: Family Medicine

## 2019-10-05 ENCOUNTER — Encounter: Payer: Self-pay | Admitting: Family Medicine

## 2019-10-05 ENCOUNTER — Telehealth: Payer: Self-pay | Admitting: Family Medicine

## 2019-10-05 NOTE — Telephone Encounter (Signed)
Pt. ask if she can have a prescription for  predisone, using inhaler more and feeling congestion in chest

## 2019-10-05 NOTE — Telephone Encounter (Signed)
Spoke with pt and she has stopped Breztri she says it was not helping. She is using albuterol hfa 1-2 times a day and doing an albuterol neb treatment once a day and using montelukast, omeprazole, and famotidine. She has been has these symptoms for a couple weeks now. Anne please advise.

## 2019-10-05 NOTE — Telephone Encounter (Signed)
Can you please put a prednisone pack up at the front for [facility] to pick up tomorrow? Prednisone 10 mg tablet take 2 tablets once a day for 4 days, then take one tablet on the 5th day, then stop. Thank you

## 2019-10-06 NOTE — Telephone Encounter (Signed)
Left message on machine for patient to return call to office. Will place prednisone pack up front for pick up.

## 2019-10-06 NOTE — Telephone Encounter (Signed)
Patient was informed. She states she will be stopping by today to pick up Prednisone.

## 2019-10-09 ENCOUNTER — Other Ambulatory Visit: Payer: Self-pay | Admitting: Neurology

## 2019-10-10 ENCOUNTER — Telehealth: Payer: Self-pay | Admitting: Diagnostic Neuroimaging

## 2019-10-10 MED ORDER — GABAPENTIN 100 MG PO CAPS
ORAL_CAPSULE | ORAL | 0 refills | Status: DC
Start: 2019-10-10 — End: 2020-05-15

## 2019-10-10 NOTE — Telephone Encounter (Signed)
[employer] med[employer] pending uploaded notes.

## 2019-10-10 NOTE — Telephone Encounter (Signed)
Pt called needing to speak to the RN regarding her gabapentin (NEURONTIN) 300 MG capsule Please advise.

## 2019-10-10 NOTE — Telephone Encounter (Signed)
Called patient and gave her Dr Richrd Humbles taper. She stated she doesn't have any gabapentin 200 mg tablets left, requested Rx for specific taper. I advised can send in. I informed her the MRIs are in insurance auth process and Irving Burton will work with [facility]. She cannot work with two different imaging centers at same time. Patient verbalized understanding, appreciation.

## 2019-10-10 NOTE — Telephone Encounter (Signed)
Spoke with patient who stated she started Gabapentin 300 mg but felt it didn't help her. She  decreased to 200 mg  3 x day for the past week. She would like to wean off it, stated it wasn't helping. She stated she wants to know what's wrong with her. She stated her PCP checked her ANA and it was normal. I advised she hasn't had MRIs done which is part of Dr [employer] work up for her symptoms. I advised she call [facility] to schedule. She asked that I schedule them; I advised her I cannot but will call them and ask that they call her again.  I will let her know Dr Richrd Humbles  taper off Gabapentin.  She  verbalized understanding, appreciation. Called [facility] who stated her MRIs were canceled because "No insurance preauthorization".  Discussed with Gerome Apley who will work on [facility]. I called patient to confirm which site she wants to get MRIs. She stated whichever can get her in soonest and before 8/16 when she starts a new job. Irving Burton is aware.

## 2019-10-10 NOTE — Telephone Encounter (Signed)
Reduce to gabapentin to 200mg  twice a day x 3 days, then 200mg  daily x 3 days, then stop. -VRP

## 2019-10-13 ENCOUNTER — Telehealth: Payer: Self-pay | Admitting: Family Medicine

## 2019-10-13 NOTE — Telephone Encounter (Signed)
Patient states she is doing her current regimen medication along with Symbicort 2 puff twice daily with the spacer and is currently on Robitussin for her congestion. Advise patient Dr. Delorse Lek was giving her Spiriva assist with her symptoms along with samples of Allegra/Xyzal and Mucinex for patient to try.

## 2019-10-13 NOTE — Telephone Encounter (Signed)
Patient called and would like a nurse to call her back to discuss asthma treatment. Patient does not feel she is getting any better.  Please advise.

## 2019-10-13 NOTE — Telephone Encounter (Signed)
Ok if she is still symptomatic on Symbicort 2 puffs twice a day (is she using with a spacer device?  If not please make sure she uses with spacer) then I would add in Spiriva 1.25mg  2 puffs daily to her symbicort regimen.  This is a way to step-up her therapy since Breztri didn't seem effective.   We have plenty of Spiriva samples if she would like to pick sample up.  If this is not effective in controlling symptoms then we will need to consider starting Xolair which is an allergic asthma injectable medication that she qualifies for with her history of seasonal and year-round allergen sensitivity.

## 2019-10-13 NOTE — Telephone Encounter (Signed)
Called and spoke with patient and she stated that she feels that her asthma has not improved. She states that she feels herself wheezing and feels like something is stuck in her chest that is agitating her. She started taking medication for congestion but is not sure if she is seeing much benefit. She tried the [place] but stated that she didn't feel like it helped so she went back to using her Symbicort. She is using her albuterol 2-3x daily. She states that she feels that the wheezing is worse in the morning when she wakes up. Please advise.

## 2019-10-16 NOTE — Telephone Encounter (Signed)
I checked the MRI status on the protal it is still pending.

## 2019-10-18 NOTE — Telephone Encounter (Signed)
I checked the status on the portal and it is still pending.  

## 2019-10-18 NOTE — Telephone Encounter (Signed)
[facility] [employer] [facility]kley Harvey: [id number] (exp. 10/10/19 to 11/10/19) order faxed to [facility] they will reach out to the patient to schedule.

## 2019-10-27 ENCOUNTER — Other Ambulatory Visit: Payer: Self-pay

## 2019-10-27 MED ORDER — SPIRIVA RESPIMAT 1.25 MCG/ACT IN AERS: INHALATION_SPRAY | RESPIRATORY_TRACT | 5 refills | Status: DC

## 2019-10-27 NOTE — Telephone Encounter (Addendum)
Please advise to the patients request.  Thanks

## 2019-10-29 ENCOUNTER — Other Ambulatory Visit: Payer: Self-pay | Admitting: Allergy

## 2019-10-31 ENCOUNTER — Encounter: Payer: Self-pay | Admitting: Gastroenterology

## 2019-11-06 ENCOUNTER — Telehealth: Payer: Self-pay | Admitting: *Deleted

## 2019-11-06 NOTE — Telephone Encounter (Signed)
Received fax from [facility]: MRI cervical spine, MRI thoracic spine results. Placed on MD's desk for review, result notes.

## 2019-11-08 NOTE — Telephone Encounter (Signed)
Called patient and informed her the xray of her left hand is normal. Dr Marjory Lies stated her cervical and thoracic spine is fine. A thyroid nodule was found, and he recommends she follow up with her PCP on this finding. She repeated results, verbalized understanding, appreciation.

## 2019-12-05 ENCOUNTER — Other Ambulatory Visit: Payer: Self-pay | Admitting: Family Medicine

## 2020-01-01 ENCOUNTER — Ambulatory Visit: Payer: Medicaid Other | Admitting: Gastroenterology

## 2020-01-10 ENCOUNTER — Other Ambulatory Visit: Payer: Self-pay | Admitting: Family Medicine

## 2020-01-10 ENCOUNTER — Telehealth: Payer: Self-pay

## 2020-01-10 NOTE — Telephone Encounter (Signed)
Called patient to inform her that since she had already had a courtesy refill of Pepcid she could not get another refill until she was seen in office. Patient said that our office sent her to a GI specialist and they told her to continue the Pepcid. I told her that I understood that but she was past due for a follow up visit. Patient asked why we couldn't refill her Pepcid if GI told her to continue I again explained that our office required follow ups to check on progress. Patient said that she had to disconnect our call because she was at work and to schedule her for Dr. Randell Patient next available telephone visit at 8:30 and sent it to her mychart, patient then disconnected call.

## 2020-01-10 NOTE — Patient Instructions (Addendum)
Asthma Continue Symbicort 160/4.5 mcg- 2 puffs twice a day with spacer to help prevent cough and wheeze.   Continue Singulair 10 mg once a day to help prevent cough and wheeze. May use Ventolin 2 puffs every 4 hours as needed for coughing, wheezing, tightness in chest or shortness of breath. You may use albuterol 2 puffs 5-15 minutes before activity to decrease cough or wheeze Asthma control goals:   Full participation in all desired activities (may need albuterol before activity)  Albuterol use two time or less a week on average (not counting use with activity)  Cough interfering with sleep two time or less a month  Oral steroids no more than once a year  No hospitalizations   Reflux Continue omeprazole 40 mg once a day. Take this medication 30 minutes before your first meal of the day Continue famotidine 20 mg twice a day for reflux.  Continue to follow up with GI  Seasonal and perennial allergic rhinitis Continue cetirizine 10 mg once a day as needed for runny nose or itching. Continue Flonase nasal spray 1 to 2 sprays per nostril once a day as needed for nasal congestion. Continue with Singulair 10 mg once a day as above. This helps with allergies too.  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 In case of an allergic reaction, give Benadryl 4 teaspoonfuls every 4 hours, and if life-threatening symptoms occur, inject with EpiPen 0.3 mg.  Schedule a follow up appointment to discuss pulling sensation in chest with primary care physician and cardiologist  Please let us know if this treatment plan is not working well for you.  Schedule a follow up appointment in 3 months

## 2020-01-11 ENCOUNTER — Other Ambulatory Visit: Payer: Self-pay

## 2020-01-11 ENCOUNTER — Ambulatory Visit (INDEPENDENT_AMBULATORY_CARE_PROVIDER_SITE_OTHER): Payer: 59 | Admitting: Family

## 2020-01-11 ENCOUNTER — Encounter: Payer: Self-pay | Admitting: Family

## 2020-01-11 DIAGNOSIS — T7800XD Anaphylactic reaction due to unspecified food, subsequent encounter: Secondary | ICD-10-CM

## 2020-01-11 DIAGNOSIS — H1013 Acute atopic conjunctivitis, bilateral: Secondary | ICD-10-CM | POA: Diagnosis not present

## 2020-01-11 DIAGNOSIS — J3089 Other allergic rhinitis: Secondary | ICD-10-CM

## 2020-01-11 DIAGNOSIS — J4540 Moderate persistent asthma, uncomplicated: Secondary | ICD-10-CM

## 2020-01-11 DIAGNOSIS — K219 Gastro-esophageal reflux disease without esophagitis: Secondary | ICD-10-CM | POA: Diagnosis not present

## 2020-01-11 MED ORDER — EPINEPHRINE 0.3 MG/0.3ML IJ SOAJ: INTRAMUSCULAR | 1 refills | Status: DC

## 2020-01-11 MED ORDER — OMEPRAZOLE 40 MG PO CPDR: 40.0000 mg | DELAYED_RELEASE_CAPSULE | Freq: Every day | ORAL | 3 refills | Status: DC

## 2020-01-11 MED ORDER — FAMOTIDINE 20 MG PO TABS: 20.0000 mg | ORAL_TABLET | Freq: Two times a day (BID) | ORAL | 3 refills | Status: DC

## 2020-01-11 NOTE — Addendum Note (Signed)
Addended by: Candis Schatz C on: 01/11/2020 05:21 PM   Modules accepted: Level of Service

## 2020-01-11 NOTE — Progress Notes (Addendum)
RE: Debra Guerrero MRN: [id number] DOB: 02-27-1986 Date of Telemedicine Visit: 01/11/2020  Referring provider: [facility] Primary care provider: [facility]  Chief Complaint: Asthma and Allergies   Telemedicine Follow Up Visit via Telephone: I connected with Debra Guerrero for a follow up on 01/11/20 by telephone and verified that I am speaking with the correct person using two identifiers.   I discussed the limitations, risks, security and privacy concerns of performing an evaluation and management service by telephone and the availability of in person appointments. I also discussed with the patient that there may be a patient responsible charge related to this service. The patient expressed understanding and agreed to proceed.  Patient is in her car.  Provider is at the office.  Visit start time: 8:31 AM Visit end time: 8:51 AM Insurance consent/check in by: Thornell Mule. Medical consent and medical assistant/nurse: Mercie Eon.  History of Present Illness: She is a 34 y.o. female, who is being followed for moderate persistent asthma, seasonal and perennial allergic rhinitis, gastroesophageal reflux disease, allergic conjunctivitis, and allergy with anaphylaxis due to food. Her previous allergy office visit was on September 27, 2019 with Thermon Leyland, FNP.   Moderate persistent asthma is reported as controlled with Symbicort 160/4.5 mcg 2 puffs twice a day with spacer, Singulair 10 mg once a day, and albuterol as needed.  She only used used Spiriva 1.25 mcg for a week, because her mucus was getting better.  She denies any coughing, wheezing, tightness in her chest, shortness of breath, and nocturnal awakenings.  Since her last office visit she has not required any systemic steroids or made any trips to the emergency room or urgent care due to breathing problems.  She has not had to use her albuterol in a while.  Seasonal and perennial allergic  rhinitis is reported as controlled with cetirizine 10 mg once a day as needed, montelukast 10 mg once a day and Flonase nasal spray as needed.  She denies any rhinorrhea, postnasal drip, and nasal congestion.  Allergic conjunctivitis is reported as controlled with olopatadine 0.2% eyedrops as needed.  She denies any itchy watery eyes.  She continues to avoid shellfish without any accidental ingestion or use of her EpiPen.  Reflux has been reported as doing okay with omeprazole 40 mg once a day and famotidine 20 mg twice a day.  Since her last office visit she has seen gastroenterology.  She reports that she has been having a pulling sensation on the left and sometimes right side of her chest/axilla region.  She is  unsure how long this has been going on.  The sensation is intermittent and she has not felt this sensation at all today.  She denies any heavy chest pain.  She has seen cardiology the past and they wanted to do a monitor for 30 days, but she reports that she could not do that at the time.  She also mentions that they wanted to do a stress test, but she did not have time to do this also due to work.  She was also uncertain the reason why these tests needed to be done.  Current medications are as listed in the chart.  Assessment and Plan: Debra Guerrero is a 34 y.o. female with: Patient Instructions  Asthma Continue Symbicort 160/4.5 mcg- 2 puffs twice a day with spacer to help prevent cough and wheeze.   Continue Singulair 10 mg once a day to help prevent cough and wheeze.  May use Ventolin 2 puffs every 4 hours as needed for coughing, wheezing, tightness in chest or shortness of breath. You may use albuterol 2 puffs 5-15 minutes before activity to decrease cough or wheeze Asthma control goals:   Full participation in all desired activities (may need albuterol before activity)  Albuterol use two time or less a week on average (not counting use with activity)  Cough interfering with sleep two  time or less a month  Oral steroids no more than once a year  No hospitalizations   Reflux Continue omeprazole 40 mg once a day. Take this medication 30 minutes before your first meal of the day Continue famotidine 20 mg twice a day for reflux.  Continue to follow up with GI  Seasonal and perennial allergic rhinitis Continue cetirizine 10 mg once a day as needed for runny nose or itching. Continue Flonase nasal spray 1 to 2 sprays per nostril once a day as needed for nasal congestion. Continue with Singulair 10 mg once a day as above. This helps with allergies too.  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 In case of an allergic reaction, give Benadryl 4 teaspoonfuls every 4 hours, and if life-threatening symptoms occur, inject with EpiPen 0.3 mg.  Schedule a follow up appointment to discuss pulling sensation in chest with primary care physician and cardiologist  Please let us know if this treatment plan is not working well for you.  Schedule a follow up appointment in 3 months      Return in about 3 months (around 04/12/2020), or if symptoms worsen or fail to improve.  No orders of the defined types were placed in this encounter.  Lab Orders  No laboratory test(s) ordered today    Diagnostics: None.  Medication List:  Current Outpatient Medications  Medication Sig Dispense Refill  . acetaminophen (TYLENOL) 500 MG tablet Take 1,000 mg by mouth every 6 (six) hours as needed for mild pain.    Marland Kitchen albuterol (PROVENTIL) (2.5 MG/3ML) 0.083% nebulizer solution INHALE CONTENTS OF 1 VIAL BY NEBULIZATION EVERY 6HRS AS NEEDED FOR WHEEZING OR SHORTNESS OF BREATH. 75 mL 1  . albuterol (VENTOLIN HFA) 108 (90 Base) MCG/ACT inhaler INHALE 2 PUFFS INTO THE LUNGS EVERY 4 HOURS AS NEEDED FOR WHEEZING OR SHORTNESS OF BREATH 18 g  1  . aspirin-acetaminophen-caffeine (EXCEDRIN MIGRAINE) 250-250-65 MG tablet Take 2 tablets by mouth every 6 (six) hours as needed for headache.    . cetirizine (ZYRTEC) 10 MG tablet Take 1 tablet (10 mg total) by mouth daily. 30 tablet 5  . Chlorhexidine Gluconate 2 % SOLN 15 mLs by Mouth Rinse route 2 (two) times daily as needed (pain).     Marland Kitchen EPINEPHrine 0.3 mg/0.3 mL IJ SOAJ injection Use as directed for severe allergic reaction 2 each 1  . famotidine (PEPCID) 20 MG tablet TAKE 1 TABLET BY MOUTH TWICE A DAY 64 tablet 0  . gabapentin (NEURONTIN) 100 MG capsule 200mg  twice a day x 3 days, then 200mg  daily x 3 days, then stop. 18 capsule 0  . ibuprofen (ADVIL) 800 MG tablet Take 800 mg by mouth every 8 (eight) hours as needed for pain.     . methylPREDNISolone sodium succinate (SOLU-MEDROL) 1000 MG injection Inject 1,000 mg into the vein daily. 4 each 0  . montelukast (SINGULAIR) 10 MG tablet TAKE  1 TABLET BY MOUTH EVERYDAY AT BEDTIME (Patient taking differently: Take 10 mg by mouth at bedtime. TAKE 1 TABLET BY MOUTH EVERYDAY AT BEDTIME) 30 tablet 5  . mupirocin ointment (BACTROBAN) 2 % Apply 1 application topically 3 (three) times daily as needed (rash).     . Olopatadine HCl 0.2 % SOLN Apply 1 drop to eye daily as needed. (Patient taking differently: Place 1 drop into both eyes daily as needed (red or dry eyes). ) 2.5 mL 5  . omeprazole (PRILOSEC) 40 MG capsule Take 1 capsule (40 mg total) by mouth daily. Take 1 tablet 30 minutes before your first meal of the day. 30 capsule 5  . pantoprazole (PROTONIX) 40 MG tablet Take 1 tablet (40 mg total) by mouth daily. 4 tablet 0  . rizatriptan (MAXALT) 10 MG tablet Take 10 mg by mouth as directed. Take 10mg  at onset of headache. Repeat in 2 hours if needed. Max 2 doses in 24 hours    . SYMBICORT 160-4.5 MCG/ACT inhaler TAKE 2 PUFFS BY MOUTH TWICE A DAY (Patient taking differently: Inhale 2 puffs into the lungs in the morning and at bedtime. ) 10.2 Inhaler 5   . Tiotropium Bromide Monohydrate (SPIRIVA RESPIMAT) 1.25 MCG/ACT AERS Inhale 2 puffs into the lungs once daily. Rinse, gargle and spit after use. (Patient not taking: Reported on 01/11/2020) 4 g 5   No current facility-administered medications for this visit.   Allergies: Allergies  Allergen Reactions  . Shellfish Allergy Hives and Shortness Of Breath   I reviewed her past medical history, social history, family history, and environmental history and no significant changes have been reported from previous visit on 09/27/2019.  Review of Systems  Constitutional: Negative for chills and fever.  HENT:       Denies rhinorrhea, post nasal drip, and nasal congestion  Eyes:       Denies itchy watery eyes  Respiratory: Negative for cough, chest tightness, shortness of breath and wheezing.   Cardiovascular: Negative for chest pain and palpitations.       Reports intermittent pulling sensation of left or right axilla/chest area  Gastrointestinal: Negative for abdominal pain.  Genitourinary: Negative for dysuria.  Skin: Negative for rash.  Neurological: Negative for headaches.       Reports not having had a headache recently   Objective: Physical Exam Not obtained as encounter was done via telephone.   Previous notes and tests were reviewed.  I discussed the assessment and treatment plan with the patient. The patient was provided an opportunity to ask questions and all were answered. The patient agreed with the plan and demonstrated an understanding of the instructions.   The patient was advised to call back or seek an in-person evaluation if the symptoms worsen or if the condition fails to improve as anticipated.  I provided 20 minutes of non-face-to-face time during this encounter.  It was my pleasure to participate in Debra Guerrero's care today. Please feel free to contact me with any questions or concerns.   Sincerely,  [place],  FNP  ________________________________________________  I have provided oversight concerning Debra Guerrero's evaluation and treatment of this patient's health issues addressed during today's encounter.  I agree with the assessment and therapeutic plan as outlined in the note.   Signed,   R Nehemiah Settle, MD

## 2020-02-05 ENCOUNTER — Telehealth: Payer: Self-pay | Admitting: Allergy

## 2020-02-05 DIAGNOSIS — J4540 Moderate persistent asthma, uncomplicated: Secondary | ICD-10-CM

## 2020-02-05 DIAGNOSIS — J3089 Other allergic rhinitis: Secondary | ICD-10-CM

## 2020-02-08 NOTE — Telephone Encounter (Signed)
Called patient back, she stated they do have it however when she went to get tested she used a different insurance and now that's the insurance they are trying to run. The are working to fix it and fill her medication.

## 2020-02-08 NOTE — Telephone Encounter (Signed)
Spoke with patient in regards to this matter. Refills came over via Rx request from pharmacy and were sent in on 02/06/2020. Patient stated she has been out of her medication for 2 days. She also stated that she is on the way to the pharmacy now, that they informed her they were getting it ready. She is hoping that they are there due to her being diagnosed with Covid and wanting to be sure she has her medication for her asthma and allergies. I informed the patient that unfortunately our phones have rolled and she would not be able to call back to inform us if she received her medication. Patient stated she should be at her pharmacy within 15 minutes. I informed the patient that if she would like I could call her back in 15-20 minutes to make sure she did receive them. Patient stated that would be great, thank you. Will call patient back shortly.

## 2020-02-08 NOTE — Telephone Encounter (Signed)
Patient states her montelukast and cetirizine is on a hold at the pharmacy. Patient states she is unsure if it is because they are out of stock. Patient is contacting the pharmacy and will call back if she needs to send it to another pharmacy. Patient would prefer to send to [facility] on [address] if needed.   FYI.

## 2020-02-09 MED ORDER — MONTELUKAST SODIUM 10 MG PO TABS: ORAL_TABLET | ORAL | 0 refills | Status: DC

## 2020-02-09 MED ORDER — CETIRIZINE HCL 10 MG PO TABS: 10.0000 mg | ORAL_TABLET | Freq: Every day | ORAL | 0 refills | Status: DC

## 2020-02-09 NOTE — Addendum Note (Signed)
Addended by: Ander Purpura R on: 02/09/2020 10:58 AM   Modules accepted: Orders

## 2020-02-09 NOTE — Telephone Encounter (Signed)
Prescription sent to requested pharmacy. Patient will be notified.

## 2020-02-09 NOTE — Telephone Encounter (Signed)
Patient needs medication sent to [facility] on [address].  Please advise.

## 2020-02-10 ENCOUNTER — Encounter: Payer: Self-pay | Admitting: Physician Assistant

## 2020-02-10 ENCOUNTER — Telehealth (HOSPITAL_COMMUNITY): Payer: Self-pay | Admitting: Family

## 2020-02-10 DIAGNOSIS — R69 Illness, unspecified: Secondary | ICD-10-CM

## 2020-02-10 NOTE — Telephone Encounter (Signed)
Called to discuss with Debra Guerrero about Covid symptoms and potential candidacy for the use of sotrovimab, a combination monoclonal antibody infusion for those with mild to moderate Covid symptoms and at a high risk of hospitalization.     Pt is qualified for this infusion at the infusion center due to co-morbid conditions and/or a member of an at-risk group, however unable to reach patient. VM left.   Wadell Craddock,NP

## 2020-02-19 ENCOUNTER — Telehealth: Payer: Self-pay | Admitting: Allergy

## 2020-02-19 NOTE — Telephone Encounter (Signed)
Patient states she had COVID after Thanksgiving and has had some problems with her asthma since then. Patient is using albuterol, montelukast, symbicort and spiriva rescue inhaler but the rescue inhaler does not seem to be helping. Patient has an appointment on 12/17 with Dr. Delorse Lek at 2:30, but patient would like to know what to do in the meantime.  Please advise.

## 2020-02-19 NOTE — Telephone Encounter (Signed)
Patient states after COVID she has been having shortness of breath. She states she feels like she has mucus in her chest and she can't get it out. She states she feels like her current medication regimen is not helping with this current issue. Please advice. Thank you

## 2020-02-20 MED ORDER — PREDNISONE 20 MG PO TABS: 20.0000 mg | ORAL_TABLET | Freq: Two times a day (BID) | ORAL | 0 refills | Status: AC

## 2020-02-20 MED ORDER — PREDNISONE 20 MG PO TABS: 20.0000 mg | ORAL_TABLET | Freq: Two times a day (BID) | ORAL | 0 refills | Status: DC

## 2020-02-20 NOTE — Telephone Encounter (Signed)
Has she tried taking Mucinex 600mg  1 tablet twice a day with the top of the water?  Mucinex helps to thin out mucus so that it can be better mobilized and moved and coughed up.

## 2020-02-20 NOTE — Telephone Encounter (Signed)
Okay.  There appears that she might benefit from a prednisone burst then since she is doing all her medications as recommended and has also tried Mucinex and still having shortness of breath and phlegm production.  Recommend prednisone 20 mg twice a day for 5 days at this time.

## 2020-02-20 NOTE — Telephone Encounter (Signed)
Called and spoke to patient and informed her of the regimen that Dr. Delorse Lek recommended. Patient says that the mucinex didn't help at all. Patient asks what else she can take to help with her condition.

## 2020-02-20 NOTE — Telephone Encounter (Signed)
Prescription sent in and patient informed. 

## 2020-02-23 ENCOUNTER — Ambulatory Visit: Payer: Medicaid Other | Admitting: Allergy

## 2020-02-26 ENCOUNTER — Ambulatory Visit: Payer: Medicaid Other | Admitting: Family

## 2020-02-26 ENCOUNTER — Telehealth: Payer: Self-pay

## 2020-03-10 ENCOUNTER — Other Ambulatory Visit: Payer: Self-pay | Admitting: Family

## 2020-03-10 DIAGNOSIS — J3089 Other allergic rhinitis: Secondary | ICD-10-CM

## 2020-03-10 DIAGNOSIS — J4540 Moderate persistent asthma, uncomplicated: Secondary | ICD-10-CM

## 2020-03-11 NOTE — Telephone Encounter (Signed)
Please send in rx for montelukast 10 mg once a day.

## 2020-03-14 ENCOUNTER — Telehealth: Payer: Self-pay | Admitting: Allergy

## 2020-03-14 NOTE — Telephone Encounter (Signed)
I would not take prednisone for mucous. It will not help with mucous.  Is she having any coughing, wheezing, tightness in chest, shortness of breath? I would make sure that she is using her Symbicort 160/4.5 mcg 2 puffs twice a day with a spacer, Singulair 10 mg once a day, and albuterol as needed.  Also, make sure that she is taking omeprazole 40 mg once a day, and famotidine 20 mg twice a day.  She also needs to continue to follow-up with GI.  Also make sure she is taking cetirizine 10 mg once a day as needed for runny nose or itching and fluticasone nasal spray 1 to 2 sprays each nostril once a day as needed for stuffy nose. If doing all this and sill having symptoms I would recommend that she schedule a follow up appointment.

## 2020-03-14 NOTE — Telephone Encounter (Signed)
Patient is doing all of the suggested except famotidine, cetirizine and fluticasone. I did ask her if it felt like it was running down her throat like it was post nasal drainage. She said yes. I told her to go ahead and add on the fluticasone and the cetirizine because this would help with that. She is going to get more famotidine, as she had run out. Patient will do all medications with the addition of the fluticasone, cetirizine and famotidine then give [place] a call back if still ongoing.

## 2020-03-14 NOTE — Telephone Encounter (Signed)
Patient is complaining of excessive mucus in her chest and throat since having covid.. She has tried mucinex but this does not help. She is taking the Symbicort, albuterol, montelukast, omeprazole and famotidine. She has some prednisone on hand and would like to know if she should take this. If so, how would you like her to take the medications. She also wanted to now if she could purchase the famotidine 20 mg over the counter, and I did tell her that was fine to do. She wanted to know about whether she should continue the omeprazole and the famotidine so I told her to continue all medications as prescribed until she is seen back here.

## 2020-03-14 NOTE — Telephone Encounter (Signed)
Thank you :)

## 2020-03-14 NOTE — Telephone Encounter (Signed)
Patient called and has some question she would like to talk with the nurse about. She said that she has some chest tightness and she has some predisone but needs to know if she should take it.also she has stop the pepcid and wants to if she needs to stay on it. And has prilosec and would like to know the difference. [facility] on [address] [phone].

## 2020-03-14 NOTE — Telephone Encounter (Signed)
I was speaking with patient about this but the call was disconnected. Patient did get to tell me that she does not have chest tightness but this is something going on since she had COVID.

## 2020-04-06 NOTE — Patient Instructions (Addendum)
Asthma Start Spiriva 1.25 mcg 2 puffs once a day to help prevent cough ane wheeze. Samples given. Fill out papers for AZ&Me and bring by the office for me to sign Continue Symbicort 160/4.5 mcg- 2 puffs twice a day with spacer to help prevent cough and wheeze.   Continue Singulair 10 mg once a day to help prevent cough and wheeze. May use Ventolin 2 puffs every 4 hours as needed for coughing, wheezing, tightness in chest or shortness of breath. You may use albuterol 2 puffs 5-15 minutes before activity to decrease cough or wheeze Asthma control goals:   Full participation in all desired activities (may need albuterol before activity)  Albuterol use two time or less a week on average (not counting use with activity)  Cough interfering with sleep two time or less a month  Oral steroids no more than once a year  No hospitalizations   Reflux Continue omeprazole 40 mg once a day. Take this medication 30 minutes before your first meal of the day Start famotidine 20 mg once a day Continue to follow up with GI  Seasonal and perennial allergic rhinitis( last skin test Jan 2017 positive to grass pollen weed pollen, ragweed, tree, mold, cat, dog, dust mite,and cockroach) Continue cetirizine 10 mg once a day as needed for runny nose or itching. Continue Flonase nasal spray 1 to 2 sprays per nostril once a day as needed for nasal congestion. Continue with Singulair 10 mg once a day as above. This helps with allergies too.  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 In case of an allergic reaction, give Benadryl 4 teaspoonfuls every 4 hours, and if life-threatening symptoms occur, inject with EpiPen 0.3 mg.  Schedule an appointment with primary care to discuss chest pain   Please let us know if this treatment plan is not working  well for you.  Schedule a follow up appointment in 2 months

## 2020-04-08 ENCOUNTER — Encounter: Payer: Self-pay | Admitting: Family

## 2020-04-08 ENCOUNTER — Ambulatory Visit (INDEPENDENT_AMBULATORY_CARE_PROVIDER_SITE_OTHER): Payer: 59 | Admitting: Family

## 2020-04-08 ENCOUNTER — Other Ambulatory Visit: Payer: Self-pay

## 2020-04-08 VITALS — BP 108/82 | HR 77 | Temp 98.0°F | Resp 18 | Ht 64.96 in | Wt 232.0 lb

## 2020-04-08 DIAGNOSIS — J4540 Moderate persistent asthma, uncomplicated: Secondary | ICD-10-CM | POA: Diagnosis not present

## 2020-04-08 DIAGNOSIS — H1013 Acute atopic conjunctivitis, bilateral: Secondary | ICD-10-CM | POA: Diagnosis not present

## 2020-04-08 DIAGNOSIS — J3089 Other allergic rhinitis: Secondary | ICD-10-CM

## 2020-04-08 DIAGNOSIS — K219 Gastro-esophageal reflux disease without esophagitis: Secondary | ICD-10-CM | POA: Diagnosis not present

## 2020-04-08 DIAGNOSIS — T7800XD Anaphylactic reaction due to unspecified food, subsequent encounter: Secondary | ICD-10-CM

## 2020-04-08 MED ORDER — OLOPATADINE HCL 0.2 % OP SOLN: 1.0000 [drp] | Freq: Every day | OPHTHALMIC | 5 refills | Status: DC | PRN

## 2020-04-08 NOTE — Progress Notes (Signed)
[address] Debra Guerrero [place] [address] Dept: [phone]  FOLLOW UP NOTE  Patient ID: Debra Guerrero, female    DOB: Oct 30, 1985  Age: 35 y.o. MRN: [id number] Date of Office Visit: 04/08/2020  Assessment  Chief Complaint: Cough (asthma)  HPI Debra Guerrero is a 35 year old female who presents today for an acute visit.  She was last seen on January 11, 2020 by Nehemiah Settle, FNP for follow-up of moderate persistent asthma, gastroesophageal reflux disease, allergic conjunctivitis, seasonal and perennial allergic rhinitis, and allergy with anaphylaxis due to food.  Moderate persistent asthma is reported as not well controlled with Symbicort 160/4.5 mcg 2 puffs twice a day with spacer, Singulair 10 mg once a day, and albuterol as needed.  She reports a dry cough and shortness of breath at times.  She denies any wheezing, tightness in her chest, and nocturnal awakenings.  Since her last office visit she has not required any systemic steroids or made any trips to the emergency room or urgent care due to breathing problems.  For the past week she has been using her albuterol once every day and has used her nebulizer 3 times this past week.  She reports that in November she was diagnosed with COVID-19 and she had a cough afterward that dwindled and eventually stopped.  She then reports two Saturdays ago both she and her daughter developed a cough.  The cough is described as dry.  She will be talking and midsentence she will have to cough.  She will use albuterol and this will help.  She is interested in seeing if she qualifies again for assistance from [employer] for her Symbicort.  Reflux is reported as moderately controlled with omeprazole 40 mg once a day and famotidine as needed.  She said caffeine triggers her reflux.  She reports that her reflux is much better.  Seasonal and perennial allergic rhinitis is reported as controlled with cetirizine 10 mg once a day, Singulair 10 mg once a  day, and Flonase nasal spray as needed.  She reports occasional drainage from her right ear and being able to hear her heart beat at times.  She denies any rhinorrhea, nasal congestion, and postnasal drip.  Conjunctivitis is reported as moderately controlled with olopatadine eyedrops as needed.  She reports occasional itchy watery eyes.  She continues to avoid shellfish without any accidental ingestion or use of her epinephrine autoinjector.   Drug Allergies:  Allergies  Allergen Reactions  . Shellfish Allergy Hives and Shortness Of Breath    Review of Systems: Review of Systems  Constitutional: Negative for chills and fever.  HENT:       Reports occasional drainage from right ear and being able to hear her heartbeat in right ear at times. Denies rhinorrhea, nasal congestion, and postnasal drip.  Eyes:       Reports occasional itchy watery eyes  Respiratory: Positive for cough and shortness of breath. Negative for wheezing.        Reports dry cough since last Saturday and occasional shortness of breath. Did not denies wheezing and tightness in chest  Cardiovascular: Positive for chest pain. Negative for palpitations.       Reports chest pain is due to her anxiety  Gastrointestinal: Negative for abdominal pain.       Reports reflux symptoms at times  Genitourinary: Negative for dysuria.  Musculoskeletal: Negative for myalgias.  Skin: Negative for itching and rash.  Neurological: Negative for headaches.  Endo/Heme/Allergies: Positive for environmental allergies.  Physical Exam: BP 108/82 (BP Location: Left Arm, Patient Position: Sitting, Cuff Size: Large)   Pulse 77   Temp 98 F (36.7 C) (Temporal)   Resp 18   Ht 5' 4.96" (1.65 m)   Wt 232 lb (105.2 kg)   SpO2 98%   BMI 38.65 kg/m    Physical Exam Constitutional:      Appearance: Normal appearance.  HENT:     Head: Normocephalic and atraumatic.     Comments: Pharynx normal, eyes normal, ears normal, nose bilateral  lower turbinates mildly edematous and slightly erythematous with no drainage noted.    Right Ear: Tympanic membrane, ear canal and external ear normal.     Left Ear: Tympanic membrane, ear canal and external ear normal.     Ears:     Comments: No drainage noted from right ear    Mouth/Throat:     Mouth: Mucous membranes are moist.     Pharynx: Oropharynx is clear.  Eyes:     Conjunctiva/sclera: Conjunctivae normal.  Cardiovascular:     Rate and Rhythm: Regular rhythm.     Heart sounds: Normal heart sounds.  Pulmonary:     Effort: Pulmonary effort is normal.     Breath sounds: Normal breath sounds.     Comments: Lungs clear to auscultation Musculoskeletal:     Cervical back: Neck supple.  Skin:    General: Skin is warm.  Neurological:     Mental Status: She is alert and oriented to person, place, and time.  Psychiatric:        Mood and Affect: Mood normal.        Behavior: Behavior normal.        Thought Content: Thought content normal.        Judgment: Judgment normal.     Diagnostics: FVC 3.11 L, FEV1 2.39 L. Predicted FVC 3.28 L, FEV1 2.75 L. Spirometry indicates normal ventilatory function.  Status post bronchodilator response shows FVC 3.15 L, FEV1 2.39 L.  Spirometry indicates normal ventilatory function with no change with bronchodilator response.  Assessment and Plan: 1. Not well controlled moderate persistent asthma   2. Seasonal and perennial allergic rhinitis   3. Gastroesophageal reflux disease, unspecified whether esophagitis present   4. Allergic conjunctivitis of both eyes   5. Allergy with anaphylaxis due to food, subsequent encounter     Meds ordered this encounter  Medications  . Olopatadine HCl 0.2 % SOLN    Sig: Apply 1 drop to eye daily as needed.    Dispense:  2.5 mL    Refill:  5    Patient Instructions  Asthma Start Spiriva 1.25 mcg 2 puffs once a day to help prevent cough ane wheeze. Samples given. Fill out papers for [employer] and bring by the  office for me to sign Continue Symbicort 160/4.5 mcg- 2 puffs twice a day with spacer to help prevent cough and wheeze.   Continue Singulair 10 mg once a day to help prevent cough and wheeze. May use Ventolin 2 puffs every 4 hours as needed for coughing, wheezing, tightness in chest or shortness of breath. You may use albuterol 2 puffs 5-15 minutes before activity to decrease cough or wheeze Asthma control goals:   Full participation in all desired activities (may need albuterol before activity)  Albuterol use two time or less a week on average (not counting use with activity)  Cough interfering with sleep two time or less a month  Oral steroids no more than once a  year  No hospitalizations   Reflux Continue omeprazole 40 mg once a day. Take this medication 30 minutes before your first meal of the day Start famotidine 20 mg once a day Continue to follow up with GI  Seasonal and perennial allergic rhinitis( last skin test Jan 2017 positive to grass pollen weed pollen, ragweed, tree, mold, cat, dog, dust mite,and cockroach) Continue cetirizine 10 mg once a day as needed for runny nose or itching. Continue Flonase nasal spray 1 to 2 sprays per nostril once a day as needed for nasal congestion. Continue with Singulair 10 mg once a day as above. This helps with allergies too.  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 In case of an allergic reaction, give Benadryl 4 teaspoonfuls every 4 hours, and if life-threatening symptoms occur, inject with EpiPen 0.3 mg.  Schedule an appointment with primary care to discuss chest pain   Please let us know if this treatment plan is not working well for you.  Schedule a follow up appointment in 2 months      Return in about 2 months (around 06/06/2020), or if symptoms worsen or  fail to improve.    Thank you for the opportunity to care for this patient.  Please do not hesitate to contact me with questions.  Nehemiah Settle, FNP [facility]

## 2020-04-17 NOTE — Telephone Encounter (Signed)
Called to see if patient wanted to reschedule her missed appointment, asked patient to call our office back.

## 2020-05-02 ENCOUNTER — Other Ambulatory Visit: Payer: Self-pay | Admitting: Family

## 2020-05-02 NOTE — Telephone Encounter (Signed)
Okay to send refill for omeprazole 40 mg capsules taking 1 capsule by mouth every day 30 minutes before first meal.

## 2020-05-15 ENCOUNTER — Encounter: Payer: Self-pay | Admitting: Family

## 2020-05-15 ENCOUNTER — Ambulatory Visit (INDEPENDENT_AMBULATORY_CARE_PROVIDER_SITE_OTHER): Payer: 59 | Admitting: Family

## 2020-05-15 ENCOUNTER — Telehealth: Payer: Self-pay | Admitting: Family

## 2020-05-15 ENCOUNTER — Other Ambulatory Visit: Payer: Self-pay

## 2020-05-15 DIAGNOSIS — H1013 Acute atopic conjunctivitis, bilateral: Secondary | ICD-10-CM

## 2020-05-15 DIAGNOSIS — J4540 Moderate persistent asthma, uncomplicated: Secondary | ICD-10-CM

## 2020-05-15 DIAGNOSIS — J3089 Other allergic rhinitis: Secondary | ICD-10-CM

## 2020-05-15 DIAGNOSIS — L508 Other urticaria: Secondary | ICD-10-CM | POA: Diagnosis not present

## 2020-05-15 DIAGNOSIS — T7800XD Anaphylactic reaction due to unspecified food, subsequent encounter: Secondary | ICD-10-CM | POA: Diagnosis not present

## 2020-05-15 DIAGNOSIS — K219 Gastro-esophageal reflux disease without esophagitis: Secondary | ICD-10-CM

## 2020-05-15 MED ORDER — PREDNISONE 10 MG PO TABS: ORAL_TABLET | ORAL | 0 refills | Status: DC

## 2020-05-15 NOTE — Telephone Encounter (Signed)
Patient called and said she is covered in hives.

## 2020-05-15 NOTE — Progress Notes (Signed)
RE: Debra Guerrero MRN: [id number] DOB: 06/27/1985 Date of Telemedicine Visit: 05/15/2020  Referring provider: Porfirio OarJeffery, [place], PA Primary care provider: Porfirio OarJeffery, [place], PA  Chief Complaint: Urticaria and Pruritus   Telemedicine Follow Up Visit via Telephone: I connected with Debra Guerrero for a follow up on 05/15/20 by telephone and verified that I am speaking with the correct person using two identifiers.   I discussed the limitations, risks, security and privacy concerns of performing an evaluation and management service by telephone and the availability of in person appointments. I also discussed with the patient that there may be a patient responsible charge related to this service. The patient expressed understanding and agreed to proceed.  Patient is at home.  Provider is at the office.  Visit start time: 11:56 AM Visit end time: 12:16 PM Insurance consent/check in by: Debra Guerrero Medical consent and medical assistant/nurse: Debra Guerrero  History of Present Illness: She is a 35 y.o. female, who is being followed for an acute visit. Her previous allergy office visit was on 04/08/2020 with Debra Mogg,FNP.   Yesterday morning she woke up itching and she noticed hives on her back, legs, shoulders, arms, and the back of her neck.  No one else in her household has this going on.  She has not had any new foods, no new medications, no new products, or detergents.  She was not sick prior to this occurring.  The  Itchy areas are stationary, but she does have new areas today.  She has not noticed any bruising.  She did feel like she got bit by a bug  when she got in bed, but she did not see any areas where she got bit.  The only time that she has ever had hives before was when she was a teenager and found out that she was allergic to shellfish.  She wonders if her allergies have caused the hives.  She has had the windows open in her house.   Moderate persistent asthma is reported as  controlled with Symbicort 160/4.5 mcg 2 puffs twice a day with spacer, montelukast 10 mg once a day, and albuterol as needed.  She denies any coughing, wheezing, tightness in her chest, shortness of breath, and nocturnal awakenings.  Since her last office visit she has not required any systemic steroids or made any trips to the emergency room or urgent care due to breathing problems.  She is not having to use her albuterol inhaler that often unless she is sick.  She stopped using Spiriva 1.25 mcg, because she did not feel like it was helpful.  Allergic rhinitis is reported as moderately controlled with cetirizine 10 mg once a day, Singulair 10 mg once a day, and Flonase nasal spray as needed.  She reports last week that she was having rhinorrhea and nasal congestion, but she is not having any of these symptoms this week.  She mentions that for the past 2 years since she was pregnant with her child she has been having this irritating noise in her right ear that comes and goes.  The sound is described as as a" whooshing sound".  She says every time someone looks in her ear she is told that they do not see anything abnormal.  She has never seen ENT.  Allergic conjunctivitis is reported as controlled with olopatadine eyedrops as needed.  She denies any itchy watery eyes.  She continues to avoid shellfish without any accidental ingestion or use of her epinephrine autoinjector device.  Reflux is reported as controlled with omeprazole 40 mg once a day and famotidine 20 mg once a day as needed.  She reports that since she has changed her diet her reflux has been better.  Assessment and Plan: Turner is a 35 y.o. female with: Patient Instructions  Acute urticaria Start prednisone 10 mg taking 2 tablets twice a day for 3 days, then on the fourth day take 2 tablets in the morning on the fifth day take 1 tablet and stop You can increase cetirizine (Zyrtec) 10 mg to twice a day as needed.  Caution as this may make  you drowsy May use over-the-counter hydrocortisone cream as needed Please let us know if you are not getting any better  Asthma Stop Spiriva Respimat 1.25 mcg Continue Symbicort 160/4.5 mcg- 2 puffs twice a day with spacer to help prevent cough and wheeze.   Continue Singulair 10 mg once a day to help prevent cough and wheeze. May use Ventolin 2 puffs every 4 hours as needed for coughing, wheezing, tightness in chest or shortness of breath. You may use albuterol 2 puffs 5-15 minutes before activity to decrease cough or wheeze Asthma control goals:   Full participation in all desired activities (may need albuterol before activity)  Albuterol use two time or less a week on average (not counting use with activity)  Cough interfering with sleep two time or less a month  Oral steroids no more than once a year  No hospitalizations   Reflux Continue omeprazole 40 mg once a day. Take this medication 30 minutes before your first meal of the day Continue famotidine 20 mg once a day as needed as per your GI doctor Continue to follow up with GI  Seasonal and perennial allergic rhinitis( last skin test Jan 2017 positive to grass pollen weed pollen, ragweed, tree, mold, cat, dog, dust mite,and cockroach) Continue cetirizine 10 mg once a day as needed for runny nose or itching. Continue Flonase nasal spray 1 to 2 sprays per nostril once a day as needed for nasal congestion. Continue with Singulair 10 mg once a day as above. This helps with allergies too. 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 In case of an allergic reaction, give Benadryl 4 teaspoonfuls every 4 hours, and if life-threatening symptoms occur, inject with EpiPen 0.3 mg.  Schedule an  appointment with primary care to discuss chest pain   Please let us know if this treatment plan is not working well for you.  Keep already scheduled a follow up appointment on June 10, 2020      Return in about 26 days (around 06/10/2020), or if symptoms worsen or fail to improve.  Meds ordered this encounter  Medications  . predniSONE (DELTASONE) 10 MG tablet    Sig: Take 2 tablets twice a day for 3 days, then on the fourth day take 2 tablets in the morning, then on the fifth day take 1 tablet and stop    Dispense:  15 tablet    Refill:  0   Lab Orders  No laboratory test(s) ordered today    Diagnostics: None.  Medication List:  Current Outpatient Medications  Medication Sig Dispense Refill  . acetaminophen (TYLENOL) 500 MG tablet Take  1,000 mg by mouth every 6 (six) hours as needed for mild pain.    Marland Kitchen albuterol (PROVENTIL) (2.5 MG/3ML) 0.083% nebulizer solution INHALE CONTENTS OF 1 VIAL BY NEBULIZATION EVERY 6HRS AS NEEDED FOR WHEEZING OR SHORTNESS OF BREATH. 75 mL 1  . albuterol (VENTOLIN HFA) 108 (90 Base) MCG/ACT inhaler INHALE 2 PUFFS INTO THE LUNGS EVERY 4 HOURS AS NEEDED FOR WHEEZING OR SHORTNESS OF BREATH 18 g 1  . albuterol (VENTOLIN HFA) 108 (90 Base) MCG/ACT inhaler Inhale into the lungs.    Marland Kitchen aspirin-acetaminophen-caffeine (EXCEDRIN MIGRAINE) 250-250-65 MG tablet Take 2 tablets by mouth every 6 (six) hours as needed for headache.    . cetirizine (ZYRTEC) 10 MG tablet Take 1 tablet (10 mg total) by mouth daily. 90 tablet 0  . EPINEPHrine 0.3 mg/0.3 mL IJ SOAJ injection Use as directed for severe allergic reaction 2 each 1  . famotidine (PEPCID) 20 MG tablet Take 1 tablet (20 mg total) by mouth 2 (two) times daily. 64 tablet 3  . fluticasone (FLONASE) 50 MCG/ACT nasal spray Place into the nose.    . montelukast (SINGULAIR) 10 MG tablet TAKE 1 TABLET BY MOUTH EVERY DAY AT BEDTIME 90 tablet 0  . mupirocin ointment (BACTROBAN) 2 % Apply 1 application topically 3 (three)  times daily as needed (rash).     . Olopatadine HCl 0.2 % SOLN Apply 1 drop to eye daily as needed. 2.5 mL 5  . omeprazole (PRILOSEC) 40 MG capsule TAKE 1 CAPSULE BY MOUTH EVERY DAY 30 MINUTES BEFORE FIRST MEAL. 30 capsule 0  . predniSONE (DELTASONE) 10 MG tablet Take 2 tablets twice a day for 3 days, then on the fourth day take 2 tablets in the morning, then on the fifth day take 1 tablet and stop 15 tablet 0  . PREVIDENT 0.2 % SOLN Take by mouth.    . rizatriptan (MAXALT) 10 MG tablet Take 10 mg by mouth as directed. Take 10mg  at onset of headache. Repeat in 2 hours if needed. Max 2 doses in 24 hours    . SODIUM FLUORIDE, DENTAL RINSE, 0.2 % SOLN Take by mouth.    . SYMBICORT 160-4.5 MCG/ACT inhaler TAKE 2 PUFFS BY MOUTH TWICE A DAY (Patient taking differently: Inhale 2 puffs into the lungs in the morning and at bedtime.) 10.2 Inhaler 5  . Tiotropium Bromide Monohydrate (SPIRIVA RESPIMAT) 1.25 MCG/ACT AERS Inhale 2 puffs into the lungs once daily. Rinse, gargle and spit after use. (Patient not taking: Reported on 05/15/2020) 4 g 5   No current facility-administered medications for this visit.   Allergies: Allergies  Allergen Reactions  . Shellfish Allergy Hives and Shortness Of Breath   I reviewed her past medical history, social history, family history, and environmental history and no significant changes have been reported from previous visit on April 08, 2020.  Review of Systems Objective: Physical Exam Not obtained as encounter was done via telephone.   Previous notes and tests were reviewed.  I discussed the assessment and treatment plan with the patient. The patient was provided an opportunity to ask questions and all were answered. The patient agreed with the plan and demonstrated an understanding of the instructions.   The patient was advised to call back or seek an in-person evaluation if the symptoms worsen or if the condition fails to improve as anticipated.  I provided 20  minutes of non-face-to-face time during this encounter.  It was my pleasure to participate in Debra Guerrero's care today. Please feel free  to contact me with any questions or concerns.   Sincerely,  Nehemiah Settle, FNP

## 2020-05-15 NOTE — Telephone Encounter (Signed)
Patient is scheduled for a televisit to see Chrissie.  Thanks

## 2020-05-15 NOTE — Patient Instructions (Signed)
Acute urticaria Start prednisone 10 mg taking 2 tablets twice a day for 3 days, then on the fourth day take 2 tablets in the morning on the fifth day take 1 tablet and stop You can increase cetirizine (Zyrtec) 10 mg to twice a day as needed.  Caution as this may make you drowsy May use over-the-counter hydrocortisone cream as needed Please let us know if you are not getting any better  Asthma Stop Spiriva Respimat 1.25 mcg Continue Symbicort 160/4.5 mcg- 2 puffs twice a day with spacer to help prevent cough and wheeze.   Continue Singulair 10 mg once a day to help prevent cough and wheeze. May use Ventolin 2 puffs every 4 hours as needed for coughing, wheezing, tightness in chest or shortness of breath. You may use albuterol 2 puffs 5-15 minutes before activity to decrease cough or wheeze Asthma control goals:   Full participation in all desired activities (may need albuterol before activity)  Albuterol use two time or less a week on average (not counting use with activity)  Cough interfering with sleep two time or less a month  Oral steroids no more than once a year  No hospitalizations   Reflux Continue omeprazole 40 mg once a day. Take this medication 30 minutes before your first meal of the day Continue famotidine 20 mg once a day as needed as per your GI doctor Continue to follow up with GI  Seasonal and perennial allergic rhinitis( last skin test Jan 2017 positive to grass pollen weed pollen, ragweed, tree, mold, cat, dog, dust mite,and cockroach) Continue cetirizine 10 mg once a day as needed for runny nose or itching. Continue Flonase nasal spray 1 to 2 sprays per nostril once a day as needed for nasal congestion. Continue with Singulair 10 mg once a day as above. This helps with allergies too. 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 In case of an allergic reaction, give Benadryl 4 teaspoonfuls every 4 hours, and if life-threatening symptoms occur, inject with EpiPen 0.3 mg.  Schedule an appointment with primary care to discuss chest pain   Please let us know if this treatment plan is not working well for you.  Keep already scheduled a follow up appointment on June 10, 2020

## 2020-05-15 NOTE — Telephone Encounter (Signed)
She has not been seen by our office for hives. Is she able to schedule a televisit today or tomorrow? Dee left a message for Fernanda to return our call. Televisit scheduled for today.

## 2020-05-15 NOTE — Telephone Encounter (Signed)
Pt believes the hives are from leaving the windows open and the wind blowing stuff in. She has not ate anything new. She is taking singulair, zyrtec, flonase ,she does not do pepcid. She does zyrtec qhs. Hives started Tuesday morning when she woke up. Red, itchy and raised

## 2020-05-20 ENCOUNTER — Telehealth: Payer: Self-pay | Admitting: Family

## 2020-05-20 NOTE — Telephone Encounter (Signed)
Referral sent to Dr. Suszanne Conners along with notes and demographics. Spoke with patient and informed her of all information.

## 2020-05-20 NOTE — Telephone Encounter (Signed)
-----   Message from Nehemiah Settle, FNP sent at 05/15/2020  1:07 PM EST ----- Refer to ENT due to tinnitus in right ear. Thank you!

## 2020-05-25 ENCOUNTER — Telehealth: Payer: Self-pay | Admitting: Allergy & Immunology

## 2020-05-25 MED ORDER — BUDESONIDE-FORMOTEROL FUMARATE 160-4.5 MCG/ACT IN AERO: 2.0000 | INHALATION_SPRAY | Freq: Two times a day (BID) | RESPIRATORY_TRACT | 3 refills | Status: DC

## 2020-05-25 NOTE — Telephone Encounter (Signed)
Patient called reporting another hive outbreak.  She took some prednisone yesterday which she must of had leftover.  This melted away the hives, but then they returned this morning.  I asked her what antihistamine she was on it she reported cetirizine.  I recommended that she take 2 of these twice a day to the weekend and call us on Monday with an update.  She does not want more prednisone.  She was having no systemic reactions including throat swelling, coughing, or wheezing.  She also asked for refill of her Symbicort, so I sent that in.   Malachi Bonds, MD [facility]

## 2020-05-26 NOTE — Telephone Encounter (Signed)
Thank you :)

## 2020-05-29 ENCOUNTER — Other Ambulatory Visit: Payer: Self-pay | Admitting: *Deleted

## 2020-05-29 MED ORDER — SYMBICORT 160-4.5 MCG/ACT IN AERO: 2.0000 | INHALATION_SPRAY | Freq: Two times a day (BID) | RESPIRATORY_TRACT | 3 refills | Status: DC

## 2020-06-07 NOTE — Patient Instructions (Addendum)
Acute urticaria  Increase cetirizine (Zyrtec) 10 mg to twice a day as needed.  Caution as this may make you drowsy Continue Singulair 10 mg as below If your symptoms re-occur, begin a journal of events that occurred for up to 6 hours before your symptoms began including foods and beverages consumed, soaps or perfumes you had contact with, and medications.  If hives persist we will get some lab work at your next office visit  Asthma Continue Symbicort 160/4.5 mcg- 2 puffs twice a day with spacer to help prevent cough and wheeze.   Continue Singulair 10 mg once a day to help prevent cough and wheeze. May use Ventolin 2 puffs every 4 hours as needed for coughing, wheezing, tightness in chest or shortness of breath. You may use albuterol 2 puffs 5-15 minutes before activity to decrease cough or wheeze Asthma control goals:   Full participation in all desired activities (may need albuterol before activity)  Albuterol use two time or less a week on average (not counting use with activity)  Cough interfering with sleep two time or less a month  Oral steroids no more than once a year  No hospitalizations   Reflux Continue omeprazole 40 mg once a day. Take this medication 30 minutes before your first meal of the day Continue famotidine 20 mg once a day as needed as per your GI doctor. You may also take this once a day to help with hives if twice a day dosing of Zyrtec does not help. Continue to follow up with GI  Seasonal and perennial allergic rhinitis( last skin test Jan 2017 positive to grass pollen weed pollen, ragweed, tree, mold, cat, dog, dust mite,and cockroach) Start azelastine nasal spray 1-2 sprays each nostril twice a day as needed for runny nose/drainage Continue cetirizine 10 mg once a day as needed for runny nose or itching. Continue Flonase nasal spray 1 to 2 sprays per nostril once a day as needed for nasal congestion. Continue with Singulair 10 mg once a day as above. This  helps with allergies too. 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 In case of an allergic reaction, give Benadryl 4 teaspoonfuls every 4 hours, and if life-threatening symptoms occur, inject with EpiPen 0.3 mg   Please let us know if this treatment plan is not working well for you. Schedule a follow-up appointment in 4 weeks or sooner if needed

## 2020-06-10 ENCOUNTER — Encounter: Payer: Self-pay | Admitting: Family

## 2020-06-10 ENCOUNTER — Other Ambulatory Visit: Payer: Self-pay

## 2020-06-10 ENCOUNTER — Ambulatory Visit (INDEPENDENT_AMBULATORY_CARE_PROVIDER_SITE_OTHER): Payer: 59 | Admitting: Family

## 2020-06-10 VITALS — BP 120/76 | HR 73 | Temp 98.0°F | Resp 18 | Ht 64.96 in | Wt 228.6 lb

## 2020-06-10 DIAGNOSIS — J3089 Other allergic rhinitis: Secondary | ICD-10-CM | POA: Diagnosis not present

## 2020-06-10 DIAGNOSIS — T7800XD Anaphylactic reaction due to unspecified food, subsequent encounter: Secondary | ICD-10-CM

## 2020-06-10 DIAGNOSIS — L508 Other urticaria: Secondary | ICD-10-CM

## 2020-06-10 DIAGNOSIS — H1013 Acute atopic conjunctivitis, bilateral: Secondary | ICD-10-CM

## 2020-06-10 DIAGNOSIS — K219 Gastro-esophageal reflux disease without esophagitis: Secondary | ICD-10-CM

## 2020-06-10 DIAGNOSIS — J4540 Moderate persistent asthma, uncomplicated: Secondary | ICD-10-CM | POA: Diagnosis not present

## 2020-06-10 MED ORDER — SYMBICORT 160-4.5 MCG/ACT IN AERO: 2.0000 | INHALATION_SPRAY | Freq: Two times a day (BID) | RESPIRATORY_TRACT | 3 refills | Status: DC

## 2020-06-10 MED ORDER — OMEPRAZOLE 40 MG PO CPDR: DELAYED_RELEASE_CAPSULE | ORAL | 2 refills | Status: DC

## 2020-06-10 MED ORDER — AZELASTINE HCL 0.15 % NA SOLN: NASAL | 2 refills | Status: AC

## 2020-06-10 MED ORDER — OLOPATADINE HCL 0.2 % OP SOLN: 1.0000 [drp] | Freq: Every day | OPHTHALMIC | 5 refills | Status: DC | PRN

## 2020-06-10 MED ORDER — ALBUTEROL SULFATE HFA 108 (90 BASE) MCG/ACT IN AERS: 2.0000 | INHALATION_SPRAY | RESPIRATORY_TRACT | 1 refills | Status: DC | PRN

## 2020-06-10 MED ORDER — MONTELUKAST SODIUM 10 MG PO TABS: ORAL_TABLET | ORAL | 0 refills | Status: DC

## 2020-06-10 MED ORDER — EPINEPHRINE 0.3 MG/0.3ML IJ SOAJ: INTRAMUSCULAR | 1 refills | Status: DC

## 2020-06-10 MED ORDER — CETIRIZINE HCL 10 MG PO TABS
10.0000 mg | ORAL_TABLET | Freq: Every day | ORAL | 0 refills | Status: DC
Start: 2020-06-10 — End: 2020-08-27

## 2020-06-10 NOTE — Progress Notes (Addendum)
[address] Debra Guerrero [place] [address] Dept: [phone]  FOLLOW UP NOTE  Patient ID: Debra Guerrero, female    DOB: 12-Mar-1985  Age: 35 y.o. MRN: [id number] Date of Office Visit: 06/10/2020  Assessment  Chief Complaint: Asthma, Allergic Rhinitis  (Itchy eyes ), and Urticaria (Unknown cause happens after going outside. Legs,arms happens randomly )  HPI Debra Guerrero is a 35 year old female who presents today for follow-up of allergic urticaria, moderate persistent asthma, gastroesophageal reflux disease, seasonal and perennial allergic rhinitis, allergic conjunctivitis, and allergy with anaphylaxis due to food.  She was last seen on May 15, 2020 by Nehemiah Settle, FNP.  Acute urticaria is reported as not well controlled with Zyrtec 10 mg once a day and Singulair 10 mg once a day.  She reports that she ran out of her Zyrtec so she she has only been doing her Zyrtec once a day rather than twice a day.  She did mention that her hives were better while taking the twice a day dosing of Zyrtec.  She mentions that her hives went away with the prednisone that was given at her last office visit but then came back when she was outside.  When the hives started at her last office visit she denies any viral illnesses and no one else having these hives.  She reports no new foods, no new medications, no new products and no insect bites.  She denies any bruising and joint pain.  The hives go away within 24 to 48 hours and then will show up in a new place.  Asthma is reported as controlled with Symbicort 160/4.5 mcg 2 puffs twice a day with a spacer, Singulair 10 mg once a day, and albuterol as needed.  She reports an occasional cough due to reflux and denies any wheezing, tightness in her chest, shortness of breath, and nocturnal awakenings.  Since her last office visit she has not made any trips to the emergency room or urgent care due to breathing problems.  She has not had to use her  albuterol in a while now.  Her last steroids were given at her last office visit on May 15, 2020 for urticaria.  Reflux is reported as moderately controlled with omeprazole 40 mg once a day and famotidine 20 mg once a day as needed.  She reports occasional reflux which causes her to cough.  Seasonal and perennial allergic rhinitis is reported as moderately controlled with cetirizine 10 mg once a day, Singulair 10 mg once a day and Flonase nasal spray as needed.  She reports postnasal drip since having COVID-19 and occasional nasal congestion in the morning.  She denies any rhinorrhea.  She has not had any sinus infections since we last saw her.  She is still having tinnitus and she did not go to the ear nose and throat that we referred her to due to them not excepting her secondary insurance.  She reports that they wanted her to pay $300 upfront.  Allergic conjunctivitis is reported as moderately controlled with olopatadine 0.2% eyedrops as needed.  She reports itchy watery eyes for which olopatadine helps.  She continues to avoid shellfish without any accidental ingestion or use of her epinephrine autoinjector device.   Drug Allergies:  Allergies  Allergen Reactions   Shellfish Allergy Hives and Shortness Of Breath    Review of Systems: Review of Systems  Constitutional: Negative for chills and fever.  HENT:       Reports post nasal drip  and occasional nasal congestion. Denies rhinorrhea  Eyes:       Reports itchy watery eyes  Respiratory: Positive for cough. Negative for shortness of breath and wheezing.        Reports occasional cough due to reflux  Cardiovascular: Negative for chest pain and palpitations.  Gastrointestinal: Positive for abdominal pain. Negative for heartburn.       Reports occasional abdominal pain.   Genitourinary: Negative for dysuria.  Skin: Positive for itching and rash.  Neurological: Positive for headaches.       Reports occasional headaches   Endo/Heme/Allergies: Positive for environmental allergies.    Physical Exam: BP 120/76   Pulse 73   Temp 98 F (36.7 C)   Resp 18   Ht 5' 4.96" (1.65 m)   Wt 228 lb 9.6 oz (103.7 kg)   SpO2 98%   BMI 38.09 kg/m    Physical Exam Constitutional:      Appearance: Normal appearance.  HENT:     Head: Normocephalic and atraumatic.     Comments: Pharynx normal, eyes normal, ears normal, nose bilateral lower turbinate mildly edematous and slightly erythematous with no drainage noted    Right Ear: Tympanic membrane, ear canal and external ear normal.     Left Ear: Tympanic membrane, ear canal and external ear normal.     Mouth/Throat:     Mouth: Mucous membranes are moist.     Pharynx: Oropharynx is clear.  Eyes:     Conjunctiva/sclera: Conjunctivae normal.  Cardiovascular:     Rate and Rhythm: Regular rhythm.     Heart sounds: Normal heart sounds.  Pulmonary:     Effort: Pulmonary effort is normal.     Breath sounds: Normal breath sounds.     Comments: Lungs clear to auscultation Musculoskeletal:     Cervical back: Neck supple.  Skin:    General: Skin is warm.     Comments: Urticarial lesions noted on bilateral arms and lower legs  Neurological:     Mental Status: She is alert and oriented to person, place, and time.  Psychiatric:        Mood and Affect: Mood normal.        Behavior: Behavior normal.        Thought Content: Thought content normal.        Judgment: Judgment normal.     Diagnostics: None  Assessment and Plan: 1. Acute urticaria   2. Moderate persistent asthma, uncomplicated   3. Seasonal and perennial allergic rhinitis   4. Allergic conjunctivitis of both eyes   5. Allergy with anaphylaxis due to food, subsequent encounter   6. Gastroesophageal reflux disease, unspecified whether esophagitis present     No orders of the defined types were placed in this encounter.   Patient Instructions  Acute urticaria  Increase cetirizine (Zyrtec) 10 mg to  twice a day as needed.  Caution as this may make you drowsy Continue Singulair 10 mg as below If your symptoms re-occur, begin a journal of events that occurred for up to 6 hours before your symptoms began including foods and beverages consumed, soaps or perfumes you had contact with, and medications.  If hives persist we will get some lab work at your next office visit  Asthma Continue Symbicort 160/4.5 mcg- 2 puffs twice a day with spacer to help prevent cough and wheeze.   Continue Singulair 10 mg once a day to help prevent cough and wheeze. May use Ventolin 2 puffs every 4 hours as  needed for coughing, wheezing, tightness in chest or shortness of breath. You may use albuterol 2 puffs 5-15 minutes before activity to decrease cough or wheeze Asthma control goals:  Full participation in all desired activities (may need albuterol before activity) Albuterol use two time or less a week on average (not counting use with activity) Cough interfering with sleep two time or less a month Oral steroids no more than once a year No hospitalizations   Reflux Continue omeprazole 40 mg once a day. Take this medication 30 minutes before your first meal of the day Continue famotidine 20 mg once a day as needed as per your GI doctor. You may also take this once a day to help with hives if twice a day dosing of Zyrtec does not help. Continue to follow up with GI  Seasonal and perennial allergic rhinitis( last skin test Jan 2017 positive to grass pollen weed pollen, ragweed, tree, mold, cat, dog, dust mite,and cockroach) Start azelastine nasal spray 1-2 sprays each nostril twice a day as needed for runny nose/drainage Continue cetirizine 10 mg once a day as needed for runny nose or itching. Continue Flonase nasal spray 1 to 2 sprays per nostril once a day as needed for nasal congestion. Continue with Singulair 10 mg once a day as above. This helps with allergies too. 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 In case of an allergic reaction, give Benadryl 4 teaspoonfuls every 4 hours, and if life-threatening symptoms occur, inject with EpiPen 0.3 mg   Please let us know if this treatment plan is not working well for you. Schedule a follow-up appointment in 4 weeks or sooner if needed      Return in about 4 weeks (around 07/08/2020), or if symptoms worsen or fail to improve.    Thank you for the opportunity to care for this patient.  Please do not hesitate to contact me with questions.  Nehemiah Settle, FNP [facility]

## 2020-06-11 NOTE — Telephone Encounter (Signed)
Spoke with Dr. Avel Sensor office and they only bill primary insurance and patient is responsible for filing any balance with secondary insurance. Informed patient and discussed [facility] accepts both insurances and will file them automatically. Patient would rather see [facility]. Faxed referral, notes and demographics to [facility], [phone] and [phone]. Patient informed of address, phone number and that they would reach out to schedule an appointment but to call if she does not hear from them in the next few days.

## 2020-06-11 NOTE — Telephone Encounter (Signed)
-----   Message from Nehemiah Settle, FNP sent at 06/10/2020 12:19 PM EDT ----- Can we see if any other ENT's accept her secondary insurance? We referred for tinnitus and she reports the ENT we referred to did not take her secondary insurance and was going to cost her $300.00.

## 2020-06-26 NOTE — Telephone Encounter (Signed)
Patient scheduled with [facility] 4/25 for hearing test @850  and seeing @930 .

## 2020-07-01 ENCOUNTER — Other Ambulatory Visit: Payer: Self-pay | Admitting: Physician Assistant

## 2020-07-01 DIAGNOSIS — H93A1 Pulsatile tinnitus, right ear: Secondary | ICD-10-CM

## 2020-07-15 ENCOUNTER — Ambulatory Visit
Admission: RE | Admit: 2020-07-15 | Discharge: 2020-07-15 | Disposition: A | Discharge status: Home / Self Care | Payer: 59 | Source: Ambulatory Visit | Attending: Physician Assistant | Admitting: Physician Assistant

## 2020-07-15 DIAGNOSIS — H93A1 Pulsatile tinnitus, right ear: Secondary | ICD-10-CM

## 2020-08-14 ENCOUNTER — Other Ambulatory Visit: Payer: Self-pay

## 2020-08-14 ENCOUNTER — Telehealth: Payer: Self-pay | Admitting: Family

## 2020-08-14 MED ORDER — OLOPATADINE HCL 0.2 % OP SOLN: 1.0000 [drp] | Freq: Every day | OPHTHALMIC | 5 refills | Status: DC | PRN

## 2020-08-14 NOTE — Telephone Encounter (Signed)
Noted! Thank you

## 2020-08-14 NOTE — Telephone Encounter (Signed)
Patient is requesting a refill for Pataday eyedrops. She called the pharmacy and they do not have that on file for her. [facility] on [address]. Last seen 06/10/20.

## 2020-08-14 NOTE — Telephone Encounter (Signed)
I Sent in the olopatadine eye drops to [facility] on [address] I called the patient to let her know it was sent in and that the pharmacy said that her previous orders have been transferred to a different pharmacy. The pharmacist could not tell me which pharmacy the 06/10/20 order was sent to it was not showing up in their system. She did state that the patient has not pick up any refills from [facility] on [address] since December of last year.

## 2020-08-27 ENCOUNTER — Other Ambulatory Visit: Payer: Self-pay | Admitting: Family

## 2020-10-07 ENCOUNTER — Telehealth: Payer: 59 | Admitting: Family

## 2020-10-07 ENCOUNTER — Other Ambulatory Visit: Payer: Self-pay | Admitting: Family

## 2020-10-07 NOTE — Telephone Encounter (Signed)
Patient called and would like a call back from a nurse. She caught a cold from her daughter about a month ago and was fine for about a week and today her dry cough came back. She states it is hard to catch her breath afterwards and is getting choked up and doesn't know if it is asthma/ allergy related. She is taking all of her medications as directed.   Please advise.

## 2020-10-09 NOTE — Telephone Encounter (Signed)
Left voicemail for patient to call back to schedule a televisit.

## 2020-10-11 NOTE — Telephone Encounter (Signed)
Called patient to go over not about the Chest X ray. She plans to follow up with her PCP for further evaluation/treatment.

## 2020-10-11 NOTE — Telephone Encounter (Signed)
Patient called this morning, 10/11/20. She said she needs a chest x-ray because of a lingering cough and she says when she coughs, it hurts her lungs. She said she was told on a previous call, that she had to be seen in office before orders could be sent for an x-ray. She wanted to come Monday, but we do not have anything for the next few weeks, with any provider or Nurse Practitioner. I offered another office and she said no. She wants to know if Dr. Delorse Lek could go ahead and place and order for a chest x-ray because she can't wait for an appointment.

## 2020-10-26 ENCOUNTER — Other Ambulatory Visit: Payer: Self-pay | Admitting: Family

## 2020-10-27 NOTE — Telephone Encounter (Signed)
Ok to refill omeprazole 40 mg once a day 30 minutes before first meal. 30 capsules with 3 refills

## 2020-12-31 ENCOUNTER — Other Ambulatory Visit: Payer: Self-pay | Admitting: Family

## 2021-01-04 NOTE — Telephone Encounter (Signed)
Please send in a 30 day courtesy refill. She needs to schedule a follow up appointment.

## 2021-02-08 ENCOUNTER — Other Ambulatory Visit: Payer: Self-pay | Admitting: Family

## 2021-02-09 NOTE — Telephone Encounter (Signed)
Courtesy refill sent on 01/06/21.

## 2021-02-10 ENCOUNTER — Telehealth: Payer: Self-pay

## 2021-02-10 NOTE — Telephone Encounter (Signed)
Called the patient in regards to needing a Symbicort refill. A 30 day courtesy refill was sent in 10/22 and patient stated she was not away it was a courtesy refill. She would like to make an appointment and would like to know if we can send in 1 more refill until she is able to be seen.

## 2021-02-11 ENCOUNTER — Other Ambulatory Visit: Payer: Self-pay

## 2021-02-11 MED ORDER — SYMBICORT 160-4.5 MCG/ACT IN AERO: INHALATION_SPRAY | RESPIRATORY_TRACT | 0 refills | Status: DC

## 2021-02-11 NOTE — Telephone Encounter (Signed)
Called patient she verbalized understanding that a courtesy refill has been sent into the [facility] pharmacy. The pateient made an appointment to be seen 03/10/21 with Amada Jupiter at pm.

## 2021-02-11 NOTE — Telephone Encounter (Signed)
Ok to send 1 more courtesy refill. Make sure she is aware that this is a courtesy refill. She needs to schedule a follow up appointment to get further refills.

## 2021-02-11 NOTE — Telephone Encounter (Signed)
Thank you :)

## 2021-02-11 NOTE — Telephone Encounter (Signed)
Called patient Debra Guerrero verbalized understanding that a courtesy refill has been sent into the [facility] pharmacy not [facility]. The pateient made an appointment to be seen 03/10/21 with Amada Jupiter at pm.

## 2021-02-12 ENCOUNTER — Other Ambulatory Visit: Payer: Self-pay | Admitting: Family

## 2021-02-14 ENCOUNTER — Other Ambulatory Visit: Payer: Self-pay | Admitting: *Deleted

## 2021-02-14 NOTE — Telephone Encounter (Signed)
Patient has an upcoming appointment and she said they symbicort was sent in, but the montelukast wasn't. She would like to know if this can be sent to [facility] on [address].

## 2021-02-14 NOTE — Telephone Encounter (Signed)
Spoke with patient, informed her that refills for montelukast has been sent to the requested pharmacy. Patient is aware she is due for an office visit.

## 2021-02-16 NOTE — Telephone Encounter (Signed)
It looks like this medications was sent on 02/14/21

## 2021-02-27 ENCOUNTER — Other Ambulatory Visit: Payer: Self-pay | Admitting: Family Medicine

## 2021-03-01 ENCOUNTER — Other Ambulatory Visit: Payer: Self-pay | Admitting: Family

## 2021-03-04 NOTE — Telephone Encounter (Signed)
She needs to keep f/u appointment on 03/10/21 for refills. Courtesy refill already sent 2 weeks ago with 30 day supply. Make sure she has enough of Singulair 10 mg to make it to that appointment.

## 2021-03-05 NOTE — Telephone Encounter (Signed)
Thank you :)

## 2021-03-05 NOTE — Telephone Encounter (Signed)
Pt has enough to last until next appt next week she said it must have been an automatic refill

## 2021-03-08 NOTE — Patient Instructions (Addendum)
Acute urticaria  Continue cetirizine (Zyrtec) 10 mg to once a day as needed.  Can increase to twice a day dosing if hives occur. Caution as this may make you drowsy Continue Singulair 10 mg as below If your symptoms re-occur, begin a journal of events that occurred for up to 6 hours before your symptoms began including foods and beverages consumed, soaps or perfumes you had contact with, and medications.  If hives persist we will get some lab work at your next office visit  Asthma Continue Symbicort 160/4.5 mcg- 2 puffs twice a day with spacer to help prevent cough and wheeze.   Continue Singulair 10 mg once a day to help prevent cough and wheeze. May use Ventolin 2 puffs every 4 hours as needed for coughing, wheezing, tightness in chest or shortness of breath. You may use albuterol 2 puffs 5-15 minutes before activity to decrease cough or wheeze Asthma control goals:  Full participation in all desired activities (may need albuterol before activity) Albuterol use two time or less a week on average (not counting use with activity) Cough interfering with sleep two time or less a month Oral steroids no more than once a year No hospitalizations   Reflux Continue omeprazole 40 mg once a day. Take this medication 30 minutes before your first meal of the day Continue famotidine 20 mg once a day as needed as per your GI doctor. You may also take this once a day to help with hives if twice a day dosing of Zyrtec does not help. Continue to follow up with GI  Seasonal and perennial allergic rhinitis( last skin test Jan 2017 positive to grass pollen weed pollen, ragweed, tree, mold, cat, dog, dust mite,and cockroach) May use azelastine nasal spray 1-2 sprays each nostril twice a day as needed for runny nose/drainage Continue cetirizine 10 mg once a day as needed for runny nose or itching. Continue Flonase nasal spray 1 to 2 sprays per nostril once a day as needed for nasal congestion. Continue with  Singulair 10 mg once a day as above. This helps with allergies too. 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 In case of an allergic reaction, give Benadryl 4 teaspoonfuls every 4 hours, and if life-threatening symptoms occur, inject with EpiPen 0.3 mg   Please let us know if this treatment plan is not working well for you. Schedule a follow-up appointment in 4 months or sooner if needed

## 2021-03-10 ENCOUNTER — Encounter: Payer: Self-pay | Admitting: Family

## 2021-03-10 ENCOUNTER — Other Ambulatory Visit: Payer: Self-pay

## 2021-03-10 ENCOUNTER — Ambulatory Visit (INDEPENDENT_AMBULATORY_CARE_PROVIDER_SITE_OTHER): Payer: Medicaid Other | Admitting: Family

## 2021-03-10 VITALS — BP 110/72 | HR 98 | Temp 98.3°F | Resp 18

## 2021-03-10 DIAGNOSIS — T7800XD Anaphylactic reaction due to unspecified food, subsequent encounter: Secondary | ICD-10-CM

## 2021-03-10 DIAGNOSIS — J3089 Other allergic rhinitis: Secondary | ICD-10-CM

## 2021-03-10 DIAGNOSIS — J4540 Moderate persistent asthma, uncomplicated: Secondary | ICD-10-CM

## 2021-03-10 DIAGNOSIS — K219 Gastro-esophageal reflux disease without esophagitis: Secondary | ICD-10-CM | POA: Diagnosis not present

## 2021-03-10 DIAGNOSIS — L508 Other urticaria: Secondary | ICD-10-CM

## 2021-03-10 DIAGNOSIS — H1013 Acute atopic conjunctivitis, bilateral: Secondary | ICD-10-CM

## 2021-03-10 MED ORDER — SYMBICORT 160-4.5 MCG/ACT IN AERO: INHALATION_SPRAY | RESPIRATORY_TRACT | 5 refills | Status: DC

## 2021-03-10 MED ORDER — VENTOLIN HFA 108 (90 BASE) MCG/ACT IN AERS: 2.0000 | INHALATION_SPRAY | Freq: Four times a day (QID) | RESPIRATORY_TRACT | 1 refills | Status: DC | PRN

## 2021-03-10 MED ORDER — OLOPATADINE HCL 0.2 % OP SOLN: 1.0000 [drp] | Freq: Every day | OPHTHALMIC | 5 refills | Status: DC | PRN

## 2021-03-10 MED ORDER — MONTELUKAST SODIUM 10 MG PO TABS: 10.0000 mg | ORAL_TABLET | Freq: Every day | ORAL | 5 refills | Status: DC

## 2021-03-10 MED ORDER — CETIRIZINE HCL 10 MG PO TABS: ORAL_TABLET | ORAL | 5 refills | Status: DC

## 2021-03-10 MED ORDER — ALBUTEROL SULFATE (2.5 MG/3ML) 0.083% IN NEBU: 3.0000 mL | INHALATION_SOLUTION | RESPIRATORY_TRACT | 1 refills | Status: DC | PRN

## 2021-03-10 MED ORDER — OMEPRAZOLE 40 MG PO CPDR: DELAYED_RELEASE_CAPSULE | ORAL | 5 refills | Status: DC

## 2021-03-10 MED ORDER — EPIPEN 2-PAK 0.3 MG/0.3ML IJ SOAJ: 0.3000 mg | INTRAMUSCULAR | 1 refills | Status: DC | PRN

## 2021-03-10 NOTE — Progress Notes (Signed)
[facility] [place] [address] Dept: [phone]  FOLLOW UP NOTE  Patient ID: Debra Guerrero, female    DOB: 07/27/1985  Age: 36 y.o. MRN: [id number] Date of Office Visit: 03/10/2021  Assessment  Chief Complaint: Follow-up and Medication Refill  HPI Debra Guerrero is a 36 year old female who presents today for follow-up of acute urticaria, moderate persistent asthma, seasonal and perennial allergic rhinitis, allergic conjunctivitis, allergy with anaphylaxis due to food, gastroesophageal reflux disease.  She was last seen on June 10, 2020 by Althea Charon, FNP.  Since her last office visit she denies any new diagnosis or surgeries.  Acute urticaria is reported as controlled with Zyrtec 10 mg once a day and Singulair 10 mg once a day.  She does mention the other day she developed a couple of hives when her husband was cooking shrimp.  She took Benadryl and was fine.  When asked if she had other symptoms such as cardiorespiratory or gastrointestinal symptoms.  She did mention that she was slightly short of breath, but that that could have been due to running upstairs.  She did use her albuterol at that time and it helped.  Asthma is reported as controlled with Symbicort 160/4.5 mcg 2 puffs twice a day with spacer, Singulair 10 mg once a day ,and albuterol as needed.  She denies any coughing, wheezing, tightness in chest, shortness of breath, and nocturnal awakenings due to breathing problems.  Since her last office visit she has not made any trips to the emergency room or urgent care due to breathing problems.  She was on 1 round of steroids back in early August for cough.  She mentions rare use of her albuterol inhaler.  Reflux is reported as controlled with omeprazole 40 mg once a day and famotidine 20 mg once a day as needed.  She denies any heartburn or reflux symptoms.  She continues to follow-up with GI.  Seasonal and perennial allergic rhinitis is reported as  controlled with cetirizine 10 mg once a day, Flonase nasal spray as needed, and Singulair 10 mg once a day.  She mentions that she does not have azelastine nasal spray and does not feel like she needs a prescription for this.  She denies rhinorrhea, nasal congestion, and postnasal drip.  When asked if she has had any sinus infections since we last saw her.  She reports that she might of had one, but it did not require an antibiotic.  She has seen ENT for tinnitus, but feels that they were not helpful.Allergic conjunctivitis is reported as controlled with olopatadine eyedrops as needed.  She denies itchy watery eyes.  She continues to avoid all shellfish and denies any accidental ingestion or use of her EpiPen.     Drug Allergies:  Allergies  Allergen Reactions   Shellfish Allergy Hives and Shortness Of Breath    Review of Systems: Review of Systems  Constitutional:  Negative for chills and fever.  HENT:         Denies rhinorrhea, nasal congestion, and postnasal drip  Eyes:        Denies itchy watery eyes  Respiratory:  Negative for cough, shortness of breath and wheezing.   Cardiovascular:  Negative for chest pain and palpitations.  Gastrointestinal:        Denies heartburn and reflux symptoms with omeprazole 40 mg once a day and famotidine 20 mg as needed  Genitourinary:  Negative for frequency.  Skin:  Reports a few urticarial lesions when her husband cooked shrimp in the house that were controlled by Benadryl.  Neurological:  Negative for headaches.  Endo/Heme/Allergies:  Positive for environmental allergies.    Physical Exam: BP 110/72 (BP Location: Left Arm, Patient Position: Sitting, Cuff Size: Normal)    Pulse 98    Temp 98.3 F (36.8 C) (Temporal)    Resp 18    SpO2 96%    Physical Exam Constitutional:      Appearance: Normal appearance.  HENT:     Head: Normocephalic and atraumatic.     Comments: Pharynx normal, eyes normal, ears normal, nose normal    Right  Ear: Tympanic membrane, ear canal and external ear normal.     Left Ear: Tympanic membrane, ear canal and external ear normal.     Nose: Nose normal.     Mouth/Throat:     Mouth: Mucous membranes are moist.     Pharynx: Oropharynx is clear.  Eyes:     Conjunctiva/sclera: Conjunctivae normal.  Cardiovascular:     Rate and Rhythm: Regular rhythm.     Heart sounds: Normal heart sounds.  Pulmonary:     Effort: Pulmonary effort is normal.     Breath sounds: Normal breath sounds.     Comments: Lungs clear to auscultation Musculoskeletal:     Cervical back: Neck supple.  Skin:    General: Skin is warm.  Neurological:     Mental Status: She is alert and oriented to person, place, and time.  Psychiatric:        Mood and Affect: Mood normal.        Behavior: Behavior normal.        Thought Content: Thought content normal.        Judgment: Judgment normal.    Diagnostics: FVC 2.56 L, FEV1 1.95 L.  Predicted FVC 3.33 L, predicted FEV1 2.78 L.  Spirometry indicates possible mild restriction.  Assessment and Plan: 1. Moderate persistent asthma, uncomplicated   2. Acute urticaria   3. Allergy with anaphylaxis due to food, subsequent encounter   4. Seasonal and perennial allergic rhinitis   5. Gastroesophageal reflux disease, unspecified whether esophagitis present   6. Allergic conjunctivitis of both eyes     Meds ordered this encounter  Medications   albuterol (PROVENTIL) (2.5 MG/3ML) 0.083% nebulizer solution    Sig: Take 3 mLs by nebulization every 4 (four) hours as needed for wheezing or shortness of breath.    Dispense:  75 mL    Refill:  1   SYMBICORT 160-4.5 MCG/ACT inhaler    Sig: Inhale 2 puffs twice a day with spacer to help prevent cough and wheeze. Rinse mouth after use.    Dispense:  10.2 g    Refill:  5   montelukast (SINGULAIR) 10 MG tablet    Sig: Take 1 tablet (10 mg total) by mouth at bedtime.    Dispense:  30 tablet    Refill:  5   cetirizine (ZYRTEC) 10 MG  tablet    Sig: TAKE 1 TABLET(10 MG) BY MOUTH DAILY    Dispense:  30 tablet    Refill:  5   omeprazole (PRILOSEC) 40 MG capsule    Sig: TAKE 1 CAPSULE BY MOUTH EVERY DAY 30 MINUTES BEFORE FIRST MEAL.    Dispense:  30 capsule    Refill:  5   Olopatadine HCl 0.2 % SOLN    Sig: Apply 1 drop to eye daily as needed. Apply 1 drop  each eye once a day as needed for itchy watery eyes.    Dispense:  2.5 mL    Refill:  5   EPIPEN 2-PAK 0.3 MG/0.3ML SOAJ injection    Sig: Inject 0.3 mg into the muscle as needed for anaphylaxis.    Dispense:  1 each    Refill:  1   VENTOLIN HFA 108 (90 Base) MCG/ACT inhaler    Sig: Inhale 2 puffs into the lungs every 6 (six) hours as needed for wheezing or shortness of breath.    Dispense:  18 g    Refill:  1    Patient Instructions  Acute urticaria  Continue cetirizine (Zyrtec) 10 mg to once a day as needed.  Can increase to twice a day dosing if hives occur. Caution as this may make you drowsy Continue Singulair 10 mg as below If your symptoms re-occur, begin a journal of events that occurred for up to 6 hours before your symptoms began including foods and beverages consumed, soaps or perfumes you had contact with, and medications.  If hives persist we will get some lab work at your next office visit  Asthma Continue Symbicort 160/4.5 mcg- 2 puffs twice a day with spacer to help prevent cough and wheeze.   Continue Singulair 10 mg once a day to help prevent cough and wheeze. May use Ventolin 2 puffs every 4 hours as needed for coughing, wheezing, tightness in chest or shortness of breath. You may use albuterol 2 puffs 5-15 minutes before activity to decrease cough or wheeze Asthma control goals:  Full participation in all desired activities (may need albuterol before activity) Albuterol use two time or less a week on average (not counting use with activity) Cough interfering with sleep two time or less a month Oral steroids no more than once a year No  hospitalizations   Reflux Continue omeprazole 40 mg once a day. Take this medication 30 minutes before your first meal of the day Continue famotidine 20 mg once a day as needed as per your GI doctor. You may also take this once a day to help with hives if twice a day dosing of Zyrtec does not help. Continue to follow up with GI  Seasonal and perennial allergic rhinitis( last skin test Jan 2017 positive to grass pollen weed pollen, ragweed, tree, mold, cat, dog, dust mite,and cockroach) May use azelastine nasal spray 1-2 sprays each nostril twice a day as needed for runny nose/drainage Continue cetirizine 10 mg once a day as needed for runny nose or itching. Continue Flonase nasal spray 1 to 2 sprays per nostril once a day as needed for nasal congestion. Continue with Singulair 10 mg once a day as above. This helps with allergies too. 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 In case of an allergic reaction, give Benadryl 4 teaspoonfuls every 4 hours, and if life-threatening symptoms occur, inject with EpiPen 0.3 mg   Please let us know if this treatment plan is not working well for you. Schedule a follow-up appointment in 4 months or sooner if needed     Return in about 4 months (around 07/08/2021), or if symptoms worsen or fail to improve.    Thank you for the opportunity to care  for this patient.  Please do not hesitate to contact me with questions.  Althea Charon, FNP [facility]

## 2021-03-23 ENCOUNTER — Other Ambulatory Visit: Payer: Self-pay | Admitting: Family

## 2021-03-24 NOTE — Telephone Encounter (Signed)
Refills sent on 03/10/21

## 2021-04-25 ENCOUNTER — Other Ambulatory Visit: Payer: Self-pay | Admitting: Allergy

## 2021-04-30 ENCOUNTER — Telehealth: Payer: Self-pay | Admitting: Allergy & Immunology

## 2021-04-30 ENCOUNTER — Other Ambulatory Visit: Payer: Self-pay | Admitting: *Deleted

## 2021-04-30 MED ORDER — CETIRIZINE HCL 10 MG PO TABS: 10.0000 mg | ORAL_TABLET | Freq: Two times a day (BID) | ORAL | 5 refills | Status: DC

## 2021-04-30 NOTE — Telephone Encounter (Signed)
Would it be ok to send this in for the patient?

## 2021-04-30 NOTE — Telephone Encounter (Signed)
Prescription has been sent in. Called patient and advised. Patient verbalized understanding.  

## 2021-04-30 NOTE — Telephone Encounter (Signed)
Ok to take Zyrtec 10 mg 1 tablet twice a day as needed when her allergies flare.

## 2021-04-30 NOTE — Telephone Encounter (Signed)
Patient called and needs to take 2 Zyrtec a day when allergy start up now. [facility] [address] [phone].

## 2021-07-21 NOTE — Patient Instructions (Addendum)
Acute urticaria- no hives since last year ? Continue cetirizine (Zyrtec) 10 mg to once a day as needed.  Can increase to twice a day dosing if hives occur. Caution as this may make you drowsy ?Continue Singulair 10 mg as below ?If your symptoms re-occur, begin a journal of events that occurred for up to 6 hours before your symptoms began including foods and beverages consumed, soaps or perfumes you had contact with, and medications.  ? ?Asthma ?Continue Symbicort 160/4.5 mcg- 2 puffs twice a day with spacer to help prevent cough and wheeze.   ?Continue Singulair 10 mg once a day to help prevent cough and wheeze. ?May use Ventolin 2 puffs every 4 hours as needed for coughing, wheezing, tightness in chest or shortness of breath. ?You may use albuterol 2 puffs 5-15 minutes before activity to decrease cough or wheeze ?Asthma control goals:  ?Full participation in all desired activities (may need albuterol before activity) ?Albuterol use two time or less a week on average (not counting use with activity) ?Cough interfering with sleep two time or less a month ?Oral steroids no more than once a year ?No hospitalizations ? ? ?Reflux ?Continue omeprazole 40 mg once a day as needed.  ?Continue famotidine 20 mg once a day as needed  ?Continue to follow up with GI ? ?Seasonal and perennial allergic rhinitis( last skin test Jan 2017 positive to grass pollen weed pollen, ragweed, tree, mold, cat, dog, dust mite,and cockroach) ?Continue cetirizine 10 mg once a day as needed for runny nose or itching. ?Continue Flonase nasal spray 1 to 2 sprays per nostril once a day as needed for nasal congestion. ?Continue with Singulair 10 mg once a day as above. This helps with allergies too. ? ?Allergic conjunctivitis ?Continue olopatadine eye drop 0.2% using 1 drop each eye once a day as needed for itchy watery eyes ? 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 In case of an allergic reaction, give  Benadryl 4 teaspoonfuls every 4 hours, and if life-threatening symptoms occur, inject with EpiPen 0.3 mg ? ? ?Please let us know if this treatment plan is not working well for you. ?Schedule a follow-up appointment in 6 months or sooner if needed ? ? ? ?

## 2021-07-22 ENCOUNTER — Encounter: Payer: Self-pay | Admitting: Family

## 2021-07-22 ENCOUNTER — Ambulatory Visit (INDEPENDENT_AMBULATORY_CARE_PROVIDER_SITE_OTHER): Payer: Medicaid Other | Admitting: Family

## 2021-07-22 VITALS — Wt 231.0 lb

## 2021-07-22 DIAGNOSIS — J3089 Other allergic rhinitis: Secondary | ICD-10-CM

## 2021-07-22 DIAGNOSIS — J4540 Moderate persistent asthma, uncomplicated: Secondary | ICD-10-CM

## 2021-07-22 DIAGNOSIS — H1013 Acute atopic conjunctivitis, bilateral: Secondary | ICD-10-CM | POA: Diagnosis not present

## 2021-07-22 DIAGNOSIS — K219 Gastro-esophageal reflux disease without esophagitis: Secondary | ICD-10-CM | POA: Diagnosis not present

## 2021-07-22 DIAGNOSIS — L508 Other urticaria: Secondary | ICD-10-CM

## 2021-07-22 DIAGNOSIS — T7800XD Anaphylactic reaction due to unspecified food, subsequent encounter: Secondary | ICD-10-CM

## 2021-07-22 MED ORDER — MONTELUKAST SODIUM 10 MG PO TABS: 10.0000 mg | ORAL_TABLET | Freq: Every day | ORAL | 1 refills | Status: DC

## 2021-07-22 MED ORDER — SYMBICORT 160-4.5 MCG/ACT IN AERO: INHALATION_SPRAY | RESPIRATORY_TRACT | 5 refills | Status: DC

## 2021-07-22 MED ORDER — VENTOLIN HFA 108 (90 BASE) MCG/ACT IN AERS: 2.0000 | INHALATION_SPRAY | Freq: Four times a day (QID) | RESPIRATORY_TRACT | 1 refills | Status: DC | PRN

## 2021-07-22 MED ORDER — CETIRIZINE HCL 10 MG PO TABS: 10.0000 mg | ORAL_TABLET | Freq: Every day | ORAL | 5 refills | Status: DC

## 2021-07-22 MED ORDER — FLUTICASONE PROPIONATE 50 MCG/ACT NA SUSP: NASAL | 5 refills | Status: DC

## 2021-07-22 NOTE — Progress Notes (Signed)
? ?RE: Debra Guerrero MRN: [id number] DOB: [birth year] ?Date of Telemedicine Visit: 07/22/2021 ? ?Referring provider: Porfirio Oar, PA ?Primary care provider: Porfirio Oar, PA ? ?Chief Complaint: Asthma and Follow-up (Pt states she have been doing well , all her symptoms are well controlled currently.) ? ? ?Telemedicine Follow Up Visit via Telephone: ?I connected with Debra Guerrero for a follow up on 07/22/21 by telephone and verified that I am speaking with the correct person using two identifiers. ?  ?I discussed the limitations, risks, security and privacy concerns of performing an evaluation and management service by telephone and the availability of in person appointments. I also discussed with the patient that there may be a patient responsible charge related to this service. The patient expressed understanding and agreed to proceed. ? ?Patient is at home ?Provider is at the office.  ?Visit start time: 11:42 AM ?Visit end time: 11:52 AM ?Insurance consent/check in by: Fleet Contras C. ?Medical consent and medical assistant/nurse: Teodora Medici. ? ?History of Present Illness: ?She is a 36 y.o. female, who is being followed for acute urticaria, moderate persistent asthma, allergy with anaphylaxis due to food, seasonal and perennial allergic rhinitis, gastroesophageal reflux disease, and allergic conjunctivitis. Her previous allergy office visit was on March 10, 2021 with  Makena Murdock,FNP .  ? ?Acute urticaria is reported as controlled with Zyrtec 10 mg once a day and Singulair 10 mg once a day.  She reports that she has not had any hives since last year. ? ?Moderate persistent asthma is reported as controlled with Symbicort 160/4.5 mcg 2 puffs twice a day with spacer, Singulair 10 mg once a day, and albuterol as needed.  She denies cough, wheeze, tightness in chest, shortness of breath, and nocturnal awakenings due to breathing problems.  Since his last office visit he has not required any systemic  steroids or made any trips to the emergency room or urgent care due to breathing problems.  She reports rare use of albuterol. ? ?Reflux is reported as moderately controlled with omeprazole 40 mg once a day as needed and famotidine 20 mg once a day as needed.  She reports occasional heartburn and reflux symptoms.  She continues to follow-up with GI. ? ?Seasonal and perennial allergic rhinitis is reported as controlled with cetirizine 10 mg once a day, Flonase nasal spray as needed, and Singulair 10 mg once a day.  She  has never had azelastine nasal spray.  She denies rhinorrhea, nasal congestion, and postnasal drip.  She thinks that she has at least had 1 sinus infection since her last office visit.  Allergic conjunctivitis is reported as controlled with olopatadine eyedrops.  She reports occasional itchy watery eyes. ? ?She continues to avoid shellfish without any accidental ingestion or use of her epinephrine autoinjector device. ? ?Assessment and Plan: ?Debra Guerrero is a 36 y.o. female with: ?Patient Instructions  ?Acute urticaria- no hives since last year ? Continue cetirizine (Zyrtec) 10 mg to once a day as needed.  Can increase to twice a day dosing if hives occur. Caution as this may make you drowsy ?Continue Singulair 10 mg as below ?If your symptoms re-occur, begin a journal of events that occurred for up to 6 hours before your symptoms began including foods and beverages consumed, soaps or perfumes you had contact with, and medications.  ? ?Asthma ?Continue Symbicort 160/4.5 mcg- 2 puffs twice a day with spacer to help prevent cough and wheeze.   ?Continue Singulair 10 mg once a day to help prevent  cough and wheeze. ?May use Ventolin 2 puffs every 4 hours as needed for coughing, wheezing, tightness in chest or shortness of breath. ?You may use albuterol 2 puffs 5-15 minutes before activity to decrease cough or wheeze ?Asthma control goals:  ?Full participation in all desired activities (may need albuterol  before activity) ?Albuterol use two time or less a week on average (not counting use with activity) ?Cough interfering with sleep two time or less a month ?Oral steroids no more than once a year ?No hospitalizations ? ? ?Reflux ?Continue omeprazole 40 mg once a day as needed.  ?Continue famotidine 20 mg once a day as needed  ?Continue to follow up with GI ? ?Seasonal and perennial allergic rhinitis( last skin test Jan 2017 positive to grass pollen weed pollen, ragweed, tree, mold, cat, dog, dust mite,and cockroach) ?Continue cetirizine 10 mg once a day as needed for runny nose or itching. ?Continue Flonase nasal spray 1 to 2 sprays per nostril once a day as needed for nasal congestion. ?Continue with Singulair 10 mg once a day as above. This helps with allergies too. ? ?Allergic conjunctivitis ?Continue olopatadine eye drop 0.2% using 1 drop each eye once a day as needed for itchy watery eyes ? 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 In case of an allergic reaction, give Benadryl 4 teaspoonfuls every 4 hours, and if life-threatening symptoms occur, inject with EpiPen 0.3 mg ? ? ?Please let us know if this treatment plan is not working well for you. ?Schedule a follow-up appointment in 6 months or sooner if needed ? ? ?  ?Return in about 6 months (around 01/22/2022), or if symptoms worsen or fail to improve. ? ?Meds ordered this encounter  ?Medications  ? cetirizine (ZYRTEC ALLERGY) 10 MG tablet  ?  Sig: Take 1 tablet (10 mg total) by mouth daily.  ?  Dispense:  30 tablet  ?  Refill:  5  ? montelukast (SINGULAIR) 10 MG tablet  ?  Sig: Take 1 tablet (10 mg total) by mouth at bedtime.  ?  Dispense:  90 tablet  ?  Refill:  1  ? SYMBICORT 160-4.5 MCG/ACT inhaler  ?  Sig: Inhale 2 puffs twice a day with spacer to help prevent cough and wheeze. Rinse mouth after use.  ?  Dispense:  10.2 g  ?  Refill:  5  ? VENTOLIN HFA 108 (90 Base) MCG/ACT inhaler  ?  Sig: Inhale 2 puffs into  the lungs every 6 (six) hours as needed for wheezing or shortness of breath.  ?  Dispense:  18 g  ?  Refill:  1  ? fluticasone (FLONASE) 50 MCG/ACT nasal spray  ?  Sig: Place 2 sprays in each nostril once a day as needed for stuffy nose  ?  Dispense:  16 g  ?  Refill:  5  ? ?Lab Orders  ?No laboratory test(s) ordered today  ? ? ?Diagnostics: ?None. ? ?Medication List:  ?Current Outpatient Medications  ?Medication Sig Dispense Refill  ? acetaminophen (TYLENOL) 500 MG tablet Take 1,000 mg by mouth every 6 (six) hours as needed for mild pain.    ? albuterol (VENTOLIN HFA) 108 (90 Base) MCG/ACT inhaler Inhale 2 puffs into the lungs every 4 (four) hours as needed for wheezing or shortness of breath. 18 g 1  ? aspirin-acetaminophen-caffeine (EXCEDRIN MIGRAINE) 250-250-65 MG tablet Take 2 tablets by mouth every 6 (six) hours as needed for headache.    ?  Azelastine HCl 0.15 % SOLN Use 1 to 2 sprays each nostril twice a day as needed for runny nose/drainage down throat 30 mL 2  ? EPIPEN 2-PAK 0.3 MG/0.3ML SOAJ injection Inject 0.3 mg into the muscle as needed for anaphylaxis. 1 each 1  ? famotidine (PEPCID) 20 MG tablet Take 1 tablet (20 mg total) by mouth 2 (two) times daily. 64 tablet 3  ? mupirocin ointment (BACTROBAN) 2 % Apply 1 application topically 3 (three) times daily as needed (rash).    ? Olopatadine HCl 0.2 % SOLN Apply 1 drop to eye daily as needed. Apply 1 drop each eye once a day as needed for itchy watery eyes. 2.5 mL 5  ? omeprazole (PRILOSEC) 40 MG capsule TAKE 1 CAPSULE BY MOUTH EVERY DAY 30 MINUTES BEFORE FIRST MEAL. 30 capsule 5  ? PREVIDENT 0.2 % SOLN Take by mouth.    ? rizatriptan (MAXALT) 10 MG tablet Take 10 mg by mouth as directed. Take  at onset of headache. Repeat in 2 hours if needed. Max 2 doses in 24 hours    ? SODIUM FLUORIDE, DENTAL RINSE, 0.2 % SOLN Take by mouth.    ? albuterol (PROVENTIL) (2.5 MG/3ML) 0.083% nebulizer solution Take 3 mLs by nebulization every 4 (four) hours as needed  for wheezing or shortness of breath. 75 mL 1  ? cetirizine (ZYRTEC ALLERGY) 10 MG tablet Take 1 tablet (10 mg total) by mouth daily. 30 tablet 5  ? cetirizine (ZYRTEC) 10 MG tablet TAKE 1 TABLET(10 MG) BY MOUTH DA

## 2021-08-06 ENCOUNTER — Ambulatory Visit: Payer: Medicaid Other | Admitting: Allergy

## 2021-11-21 ENCOUNTER — Other Ambulatory Visit: Payer: Self-pay | Admitting: Family

## 2021-11-27 ENCOUNTER — Other Ambulatory Visit: Payer: Self-pay | Admitting: Family

## 2021-12-23 ENCOUNTER — Ambulatory Visit (INDEPENDENT_AMBULATORY_CARE_PROVIDER_SITE_OTHER): Payer: Medicaid Other

## 2021-12-23 ENCOUNTER — Ambulatory Visit: Payer: Medicaid Other | Admitting: Podiatry

## 2021-12-23 DIAGNOSIS — M722 Plantar fascial fibromatosis: Secondary | ICD-10-CM

## 2021-12-23 NOTE — Patient Instructions (Signed)

## 2021-12-23 NOTE — Progress Notes (Signed)
  Subjective:  Patient ID: Debra Guerrero, female    DOB: 1986-01-30,  MRN: [id number]  Chief Complaint  Patient presents with   Foot Pain    Pt states she get a gets spasms in both feet during the night times , she states the heel part of her shoes wear out quicker on the left side than the right side. She states the heel pain has been preventing her from working out     36 y.o. female presents with the above complaint. History confirmed with patient.   Objective:  Physical Exam: warm, good capillary refill, no trophic changes or ulcerative lesions, normal DP and PT pulses, and normal sensory exam. Left Foot: point tenderness over the heel pad Right Foot:  Mild pain over heel much less than left  No images are attached to the encounter.  Radiographs: Multiple views x-ray of both feet: no fracture, dislocation, swelling or degenerative changes noted and plantar calcaneal spur, noted on the left side but on the right Assessment:   1. Plantar fasciitis      Plan:  Patient was evaluated and treated and all questions answered.  Discussed the etiology and treatment options for plantar fasciitis including stretching, formal physical therapy, supportive shoegears such as a running shoe or sneaker, pre fabricated orthoses, injection therapy, and oral medications. We also discussed the role of surgical treatment of this for patients who do not improve after exhausting non-surgical treatment options.   -XR reviewed with patient -Educated patient on stretching and icing of the affected limb.  Home therapy plan dispensed.  Discussed if not better in about 4 to 6 weeks would recommend formal physical therapy and she will let me know how she is doing for this -Injection delivered to the plantar fascia of the left foot.  After sterile prep with povidone-iodine solution and alcohol, the left heel was injected with 0.5cc 2% xylocaine plain, 0.5cc 0.5% marcaine plain, 5mg  triamcinolone  acetonide, and 2mg  dexamethasone was injected along the medial plantar fascia at the insertion on the plantar calcaneus. The patient tolerated the procedure well without complication.  No follow-ups on file.

## 2022-03-04 IMAGING — DX DG CHEST 1V PORT
1 series · 1 of 1 positions shown · non-contrast
Comparison: 08/29/2017

CLINICAL DATA: Chest pain, generalized weakness

EXAM:
PORTABLE CHEST 1 VIEW

[chest]
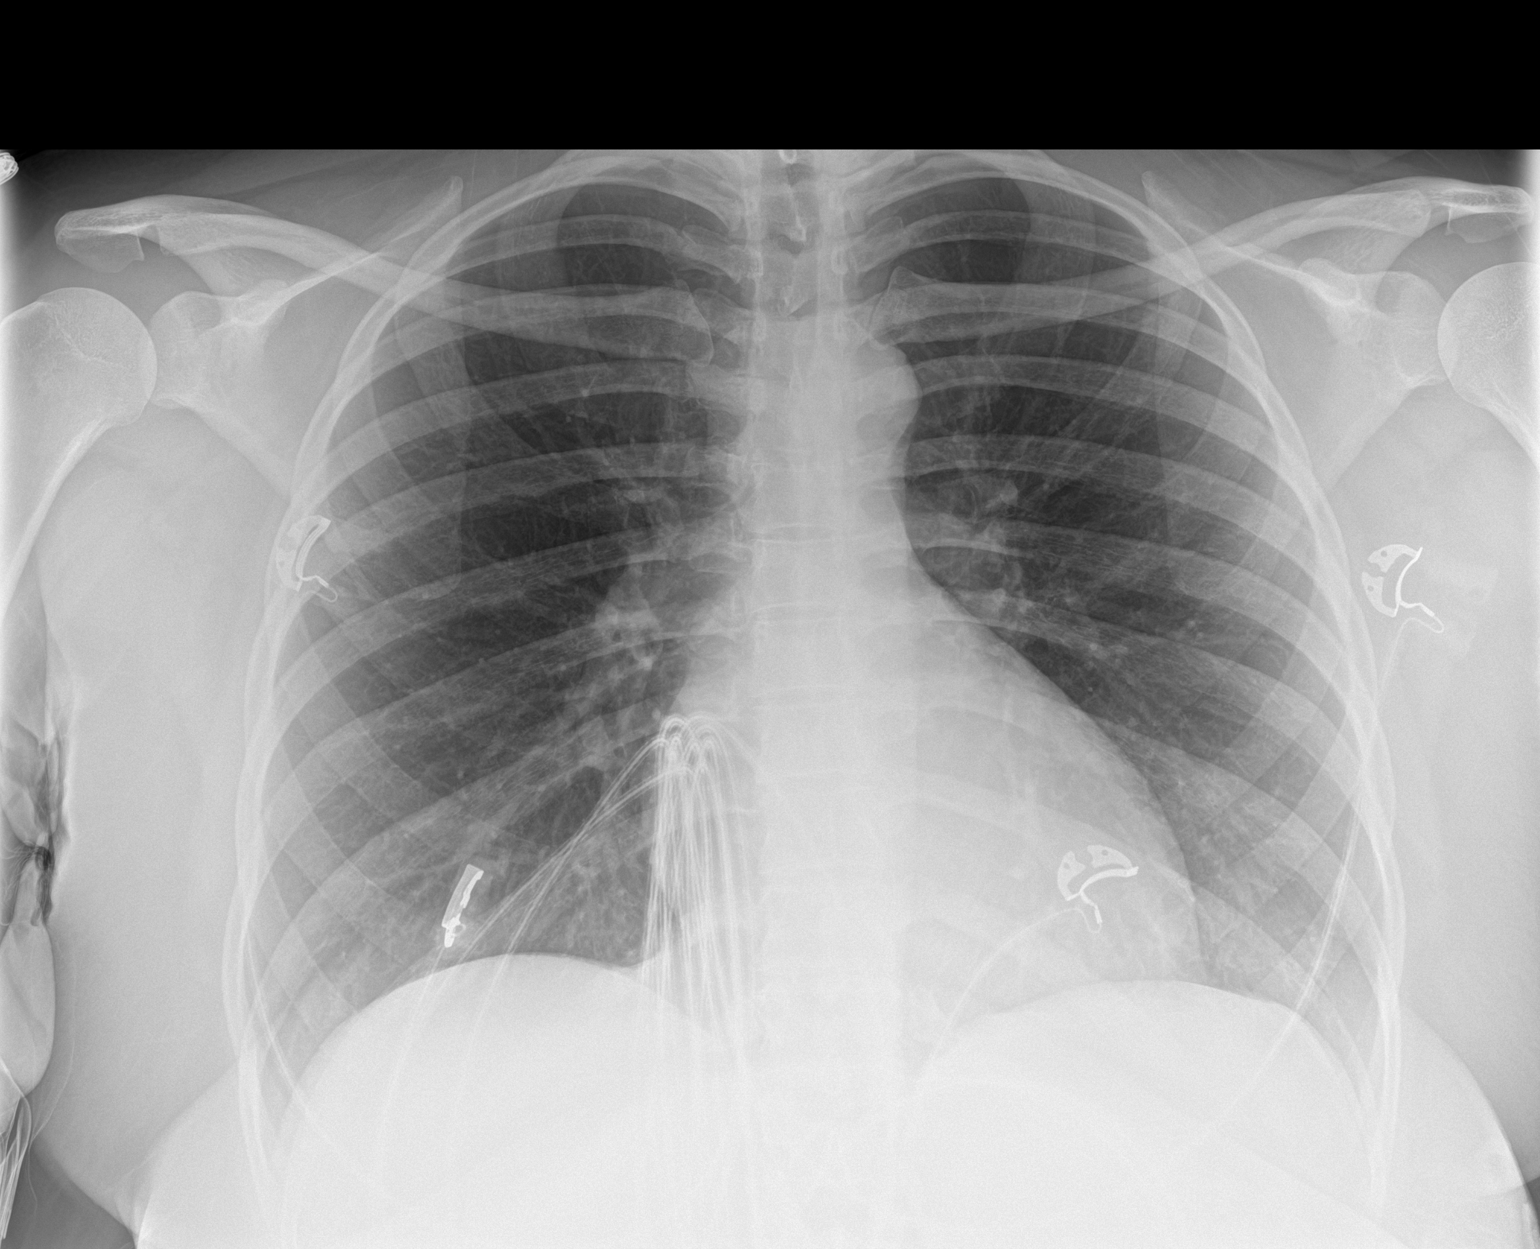

[1 of 1 positions shown; findings below may reference images not displayed]

FINDINGS: The heart size and mediastinal contours are within normal limits.
Both lungs are clear. The visualized skeletal structures are
unremarkable.
IMPRESSION: No active disease.

## 2022-03-17 ENCOUNTER — Other Ambulatory Visit: Payer: Self-pay

## 2022-04-01 ENCOUNTER — Telehealth: Payer: Self-pay | Admitting: Allergy

## 2022-04-01 NOTE — Telephone Encounter (Signed)
Patient called and stated that she called pharmacy for a refill on her montelukast, but pharmacy said they had no refills and that she would have to call the provider. Patient has not been seen since January 2023 so she would have to make an appointment to get refills. Patient scheduled appointment tomorrow 04/02/22 at 10:40 with Dr Nelva Bush. [facility] [address] Patients call back number is [phone]

## 2022-04-01 NOTE — Telephone Encounter (Signed)
Called patient back - No DPR on file - LMOVM to contact office regarding medication refill or check her myChart.  If patient calls back - please advise her medication refills will be down at her appt.

## 2022-04-02 ENCOUNTER — Encounter: Payer: Self-pay | Admitting: Allergy

## 2022-04-02 ENCOUNTER — Ambulatory Visit (INDEPENDENT_AMBULATORY_CARE_PROVIDER_SITE_OTHER): Payer: Medicaid Other | Admitting: Allergy

## 2022-04-02 ENCOUNTER — Other Ambulatory Visit: Payer: Self-pay

## 2022-04-02 VITALS — BP 128/74 | HR 75 | Temp 97.9°F | Resp 20 | Ht 65.0 in | Wt 243.0 lb

## 2022-04-02 DIAGNOSIS — J3089 Other allergic rhinitis: Secondary | ICD-10-CM

## 2022-04-02 DIAGNOSIS — H1013 Acute atopic conjunctivitis, bilateral: Secondary | ICD-10-CM

## 2022-04-02 DIAGNOSIS — L508 Other urticaria: Secondary | ICD-10-CM

## 2022-04-02 DIAGNOSIS — T7800XD Anaphylactic reaction due to unspecified food, subsequent encounter: Secondary | ICD-10-CM | POA: Diagnosis not present

## 2022-04-02 DIAGNOSIS — J4540 Moderate persistent asthma, uncomplicated: Secondary | ICD-10-CM | POA: Diagnosis not present

## 2022-04-02 DIAGNOSIS — K219 Gastro-esophageal reflux disease without esophagitis: Secondary | ICD-10-CM

## 2022-04-02 MED ORDER — EPIPEN 2-PAK 0.3 MG/0.3ML IJ SOAJ: 0.3000 mg | INTRAMUSCULAR | 1 refills | Status: DC | PRN

## 2022-04-02 MED ORDER — MONTELUKAST SODIUM 10 MG PO TABS: 10.0000 mg | ORAL_TABLET | Freq: Every day | ORAL | 2 refills | Status: DC

## 2022-04-02 MED ORDER — ALBUTEROL SULFATE (2.5 MG/3ML) 0.083% IN NEBU: 3.0000 mL | INHALATION_SOLUTION | RESPIRATORY_TRACT | 1 refills | Status: AC | PRN

## 2022-04-02 MED ORDER — FLUTICASONE PROPIONATE 50 MCG/ACT NA SUSP: NASAL | 5 refills | Status: DC

## 2022-04-02 MED ORDER — SYMBICORT 160-4.5 MCG/ACT IN AERO: INHALATION_SPRAY | RESPIRATORY_TRACT | 5 refills | Status: DC

## 2022-04-02 MED ORDER — ALBUTEROL SULFATE HFA 108 (90 BASE) MCG/ACT IN AERS: 2.0000 | INHALATION_SPRAY | RESPIRATORY_TRACT | 1 refills | Status: DC | PRN

## 2022-04-02 NOTE — Progress Notes (Signed)
Follow-up Note  RE: [name] MRN: [id number] DOB: February 06, 1986 Date of Office Visit: 04/02/2022   History of present illness: Debra Guerrero is a 37 y.o. female presenting today for follow-up of asthma, allergic rhinitis with conjunctivitis, food allergy, reflux and urticaria.  She was last seen in the office on 07/22/2021 by our nurse practitioner Amada Jupiter.  She has not had any major health changes, surgeries or hospitalizations since this visit.  She states did have URIs over the winter and has interrupted her household.  She reports she had a lot of coughing and phlegm.  She used mucinex and time.  She did need to use albuterol more with the URIs.  She does not report needing any urgent care or ED visits or anyone her primary care.  She did not need to receive any systemic steroids or antibiotics.  When she was not sick she reports no use of albuterol.  Otherwise her asthma has been quite controlled.  She does continue to use symbicort 2 puffs twice a day.  She does continue to take Singulair daily as well.  She reports allergy symptoms have been doing well.   She has been taking zyrtec daily since last visit.  She has used Flonase when needed if she is having nasal congestion as well as olopatadine as needed for any itchy or watery eye symptoms.  She is interested to see how the spring season will do in regards to her allergy symptoms.  She has not had any hives since last visit.  She usually has hives in the spring time.   She continues to avoid shellfish without any accidental ingestions or need to use her epinephrine device.  For reflux control she will use omeprazole and/or famotidine if needed.   Review of systems in the past 4 weeks: Review of Systems  Constitutional: Negative.   HENT: Negative.    Eyes: Negative.   Respiratory: Negative.    Cardiovascular: Negative.   Gastrointestinal: Negative.   Musculoskeletal: Negative.   Skin: Negative.    Allergic/Immunologic: Negative.   Neurological: Negative.      All other systems negative unless noted above in HPI  Past medical/social/surgical/family history have been reviewed and are unchanged unless specifically indicated below.  No changes  Medication List: Current Outpatient Medications  Medication Sig Dispense Refill   acetaminophen (TYLENOL) 500 MG tablet Take 1,000 mg by mouth every 6 (six) hours as needed for mild pain.     aspirin-acetaminophen-caffeine (EXCEDRIN MIGRAINE) 250-250-65 MG tablet Take 2 tablets by mouth every 6 (six) hours as needed for headache.     Azelastine HCl 0.15 % SOLN Use 1 to 2 sprays each nostril twice a day as needed for runny nose/drainage down throat 30 mL 2   cetirizine (ZYRTEC ALLERGY) 10 MG tablet Take 1 tablet (10 mg total) by mouth daily. 30 tablet 5   cetirizine (ZYRTEC) 10 MG tablet TAKE 1 TABLET(10 MG) BY MOUTH DAILY 30 tablet 5   cetirizine (ZYRTEC) 10 MG tablet TAKE 1 TABLET(10 MG) BY MOUTH DAILY 90 tablet 1   cetirizine (ZYRTEC) 10 MG tablet Take by mouth.     dicyclomine (BENTYL) 20 MG tablet Take by mouth.     famotidine (PEPCID) 20 MG tablet Take 1 tablet (20 mg total) by mouth 2 (two) times daily. 64 tablet 3   famotidine (PEPCID) 20 MG tablet Take by mouth.     fluconazole (DIFLUCAN) 150 MG tablet Take 150 mg by mouth once.  mupirocin ointment (BACTROBAN) 2 % Apply 1 application topically 3 (three) times daily as needed (rash).     Olopatadine HCl 0.2 % SOLN Apply 1 drop to eye daily as needed. Apply 1 drop each eye once a day as needed for itchy watery eyes. 2.5 mL 5   omeprazole (PRILOSEC) 40 MG capsule TAKE 1 CAPSULE BY MOUTH EVERY DAY 30 MINUTES BEFORE FIRST MEAL. 30 capsule 5   paragard intrauterine copper IUD IUD by Intrauterine route.     phentermine (ADIPEX-P) 37.5 MG tablet Take 18.75 mg by mouth every morning.     PREVIDENT 0.2 % SOLN Take by mouth.     rizatriptan (MAXALT) 10 MG tablet Take 10 mg by mouth as  directed. Take 10mg  at onset of headache. Repeat in 2 hours if needed. Max 2 doses in 24 hours     SODIUM FLUORIDE, DENTAL RINSE, 0.2 % SOLN Take by mouth.     tobramycin-dexamethasone (TOBRADEX) ophthalmic solution Place into the right eye.     VENTOLIN HFA 108 (90 Base) MCG/ACT inhaler Inhale 2 puffs into the lungs every 6 (six) hours as needed for wheezing or shortness of breath. 18 g 1   albuterol (PROVENTIL) (2.5 MG/3ML) 0.083% nebulizer solution Take 3 mLs by nebulization every 4 (four) hours as needed for wheezing or shortness of breath. 75 mL 1   albuterol (VENTOLIN HFA) 108 (90 Base) MCG/ACT inhaler Inhale 2 puffs into the lungs every 4 (four) hours as needed for wheezing or shortness of breath. 18 g 1   EPIPEN 2-PAK 0.3 MG/0.3ML SOAJ injection Inject 0.3 mg into the muscle as needed for anaphylaxis. 1 each 1   fluticasone (FLONASE) 50 MCG/ACT nasal spray Place 2 sprays in each nostril once a day as needed for stuffy nose 16 g 5   montelukast (SINGULAIR) 10 MG tablet Take 1 tablet (10 mg total) by mouth at bedtime. 90 tablet 2   SYMBICORT 160-4.5 MCG/ACT inhaler Inhale 2 puffs twice a day with spacer to help prevent cough and wheeze. Rinse mouth after use. 10.2 g 5   No current facility-administered medications for this visit.     Known medication allergies: Allergies  Allergen Reactions   Shellfish Allergy Hives and Shortness Of Breath   Bee Pollen     Other reaction(s): Other Nasal congestion  Nasal congestion       Physical examination: Blood pressure 128/74, pulse 75, temperature 97.9 F (36.6 C), resp. rate 20, height 5\' 5"  (1.651 m), weight 243 lb (110.2 kg), SpO2 100 %, unknown if currently breastfeeding.  General: Alert, interactive, in no acute distress. HEENT: PERRLA, TMs pearly gray, turbinates non-edematous without discharge, post-pharynx non erythematous. Neck: Supple without lymphadenopathy. Lungs: Clear to auscultation without wheezing, rhonchi or rales. {no  increased work of breathing. CV: Normal S1, S2 without murmurs. Abdomen: Nondistended, nontender. Skin: Warm and dry, without lesions or rashes. Extremities:  No clubbing, cyanosis or edema. Neuro:   Grossly intact.  Diagnositics/Labs:  Spirometry: FEV1: 2.17L 80%, FVC: 2.85 88%, ratio consistent with nonobstructive pattern  Assessment and plan:   Acute urticaria Use cetirizine (Zyrtec) 10 mg to once a day as needed.  Can increase to twice a day dosing if hives occur. Caution as this may make you drowsy Continue Singulair 10 mg as below If your symptoms re-occur, begin a journal of events that occurred for up to 6 hours before your symptoms began including foods and beverages consumed, soaps or perfumes you had contact with, and medications.  Asthma Continue Symbicort 160/4.5 mcg- 2 puffs twice a day with spacer to help prevent cough and wheeze.   Continue Singulair 10 mg once a day to help prevent cough and wheeze. May use Ventolin 2 puffs every 4 hours as needed for coughing, wheezing, tightness in chest or shortness of breath. You may use albuterol 2 puffs 5-15 minutes before activity to decrease cough or wheeze Asthma control goals:  Full participation in all desired activities (may need albuterol before activity) Albuterol use two time or less a week on average (not counting use with activity) Cough interfering with sleep two time or less a month Oral steroids no more than once a year No hospitalizations  Reflux Continue omeprazole 40 mg once a day as needed.  Continue famotidine 20 mg once a day as needed  Continue to follow up with GI  Seasonal and perennial allergic rhinitis( last skin test Jan 2017 positive to grass pollen weed pollen, ragweed, tree, mold, cat, dog, dust mite,and cockroach) For now take a break from Zyrtec until late February and can resume then in preparation for pollen season.   If Zyrtec is no longer effective would change to either Allegra or Xyzal.   Continue Flonase nasal spray 1 to 2 sprays per nostril once a day as needed for nasal congestion. Continue with Singulair 10 mg once a day as above.   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 In case of an allergic reaction, give Benadryl 4 teaspoonfuls every 4 hours, and if life-threatening symptoms occur, inject with EpiPen 0.3 mg   Schedule a follow-up appointment in 6 months or sooner if needed  I appreciate the opportunity to take part in Hannahgrace's care. Please do not hesitate to contact me with questions.  Sincerely,   Prudy Feeler, MD Allergy/Immunology [facility]

## 2022-04-02 NOTE — Patient Instructions (Addendum)
Acute urticaria Use cetirizine (Zyrtec) 10 mg to once a day as needed.  Can increase to twice a day dosing if hives occur. Caution as this may make you drowsy Continue Singulair 10 mg as below If your symptoms re-occur, begin a journal of events that occurred for up to 6 hours before your symptoms began including foods and beverages consumed, soaps or perfumes you had contact with, and medications.   Asthma Continue Symbicort 160/4.5 mcg- 2 puffs twice a day with spacer to help prevent cough and wheeze.   Continue Singulair 10 mg once a day to help prevent cough and wheeze. May use Ventolin 2 puffs every 4 hours as needed for coughing, wheezing, tightness in chest or shortness of breath. You may use albuterol 2 puffs 5-15 minutes before activity to decrease cough or wheeze Asthma control goals:  Full participation in all desired activities (may need albuterol before activity) Albuterol use two time or less a week on average (not counting use with activity) Cough interfering with sleep two time or less a month Oral steroids no more than once a year No hospitalizations  Reflux Continue omeprazole 40 mg once a day as needed.  Continue famotidine 20 mg once a day as needed  Continue to follow up with GI  Seasonal and perennial allergic rhinitis( last skin test Jan 2017 positive to grass pollen weed pollen, ragweed, tree, mold, cat, dog, dust mite,and cockroach) For now take a break from Zyrtec until late February and can resume then in preparation for pollen season.   If Zyrtec is no longer effective would change to either Allegra or Xyzal.  Continue Flonase nasal spray 1 to 2 sprays per nostril once a day as needed for nasal congestion. Continue with Singulair 10 mg once a day as above.   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  In case of an allergic reaction, give Benadryl 4 teaspoonfuls every 4 hours, and if life-threatening symptoms occur, inject with EpiPen 0.3 mg   Schedule a follow-up appointment in 6 months or sooner if needed

## 2022-04-09 ENCOUNTER — Ambulatory Visit: Payer: Medicaid Other | Admitting: Allergy

## 2022-10-02 ENCOUNTER — Ambulatory Visit (INDEPENDENT_AMBULATORY_CARE_PROVIDER_SITE_OTHER): Payer: Medicaid Other | Admitting: Allergy

## 2022-10-02 ENCOUNTER — Other Ambulatory Visit: Payer: Self-pay

## 2022-10-02 ENCOUNTER — Encounter: Payer: Self-pay | Admitting: Allergy

## 2022-10-02 VITALS — BP 128/76 | HR 96 | Temp 97.6°F | Resp 18 | Ht 65.0 in | Wt 245.0 lb

## 2022-10-02 DIAGNOSIS — J4540 Moderate persistent asthma, uncomplicated: Secondary | ICD-10-CM

## 2022-10-02 DIAGNOSIS — H1013 Acute atopic conjunctivitis, bilateral: Secondary | ICD-10-CM

## 2022-10-02 DIAGNOSIS — J3089 Other allergic rhinitis: Secondary | ICD-10-CM

## 2022-10-02 DIAGNOSIS — T7800XD Anaphylactic reaction due to unspecified food, subsequent encounter: Secondary | ICD-10-CM

## 2022-10-02 DIAGNOSIS — K219 Gastro-esophageal reflux disease without esophagitis: Secondary | ICD-10-CM

## 2022-10-02 DIAGNOSIS — L508 Other urticaria: Secondary | ICD-10-CM

## 2022-10-02 MED ORDER — FLUTICASONE PROPIONATE 50 MCG/ACT NA SUSP: NASAL | 5 refills | Status: AC

## 2022-10-02 MED ORDER — OLOPATADINE HCL 0.2 % OP SOLN: 1.0000 [drp] | Freq: Every day | OPHTHALMIC | 5 refills | Status: DC | PRN

## 2022-10-02 MED ORDER — SYMBICORT 160-4.5 MCG/ACT IN AERO: INHALATION_SPRAY | RESPIRATORY_TRACT | 5 refills | Status: DC

## 2022-10-02 MED ORDER — CETIRIZINE HCL 10 MG PO TABS: 10.0000 mg | ORAL_TABLET | Freq: Every day | ORAL | 5 refills | Status: DC

## 2022-10-02 MED ORDER — ALBUTEROL SULFATE HFA 108 (90 BASE) MCG/ACT IN AERS: 2.0000 | INHALATION_SPRAY | RESPIRATORY_TRACT | 1 refills | Status: DC | PRN

## 2022-10-02 MED ORDER — OMEPRAZOLE 40 MG PO CPDR: DELAYED_RELEASE_CAPSULE | ORAL | 5 refills | Status: DC

## 2022-10-02 MED ORDER — MONTELUKAST SODIUM 10 MG PO TABS: 10.0000 mg | ORAL_TABLET | Freq: Every day | ORAL | 1 refills | Status: DC

## 2022-10-02 NOTE — Patient Instructions (Addendum)
Acute urticaria - doing well without outbreaks Use cetirizine (Zyrtec) 10 mg to 1-2 times a day if having active hives  Continue Singulair 10 mg as below If your symptoms re-occur, begin a journal of events that occurred for up to 6 hours before your symptoms began including foods and beverages consumed, soaps or perfumes you had contact with, and medications.   Asthma - under good control at this time Continue Symbicort 160/4.5 mcg- 2 puffs 1-2 times a day with spacer pending symptoms Continue Singulair 10 mg once a day to help prevent cough and wheeze. May use Ventolin 2 puffs every 4 hours as needed for coughing, wheezing, tightness in chest or shortness of breath. You may use albuterol 2 puffs 5-15 minutes before activity to decrease cough or wheeze Asthma control goals:  Full participation in all desired activities (may need albuterol before activity) Albuterol use two time or less a week on average (not counting use with activity) Cough interfering with sleep two time or less a month Oral steroids no more than once a year No hospitalizations  Reflux Continue omeprazole 40 mg once a day as needed.  Continue famotidine 20 mg once a day as needed. Continue to follow up with GI  Seasonal and perennial allergic rhinitis (grass pollen weed pollen, ragweed, tree, mold, cat, dog, dust mite,and cockroach) Continue Zyrtec 10mg  daily.  May take additional dose if needed Continue Flonase nasal spray 1 to 2 sprays per nostril once a day as needed for nasal congestion. Continue with Singulair 10 mg once a day as above.   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 In case of an allergic reaction, give Benadryl 4 teaspoonfuls every 4 hours, and if life-threatening symptoms occur, inject with EpiPen 0.3 mg   Follow-up appointment in 6  months or sooner if needed

## 2022-10-02 NOTE — Progress Notes (Signed)
Follow-up Note  RE: Debra Guerrero MRN: [id number] DOB: [birth year] Date of Office Visit: 10/02/2022   History of present illness: Debra Guerrero is a 37 y.o. female presenting today for follow-up of asthma, urticaria, allergic rhinitis with conjunctivitis, food allergy, reflux.  She was last seen in the office on 04/02/2022 by myself.  She has not had any major health changes, surgeries or hospitalizations since this time.  She states she has been doing well over past 6 months.  In regards to her asthma she states it has been controlled.  She reports using albuterol 1-2 times in past 6 months.  At this time is not having any daytime or nighttime symptoms.  She has not had any ED or urgent care visits or systemic steroid needs.  She is currently using Symbicort 2 puffs once a day with spacer.  She has not had any need to increase her Symbicort use in the past 6 months. She has felt more eye symptoms this spring/summer with burning of eyes, redness of eyes.  Also a little bit of congestion.  She states the zyrtec is still helpful as there are days where she tried to see if she could do without it and she had more itching but she resumed use.  Ptaday also help with her eye symptoms when used as needed. She has not had any hive episodes since her last visit. She continues to avoid shellfish in the diet without any accidental ingestions or need to use her epinephrine device. She has an upcoming GI appt for reflux.  She states there are some foods that triggers her reflux.  She will use omeprazole and Pepcid for reflux control as needed.  Review of systems: 10pt ROS negative unless noted above in HPI  Past medical/social/surgical/family history have been reviewed and are unchanged unless specifically indicated below.  No changes  Medication List: Current Outpatient Medications  Medication Sig Dispense Refill   acetaminophen (TYLENOL) 500 MG tablet Take 1,000 mg by mouth every 6 (six)  hours as needed for mild pain.     aspirin-acetaminophen-caffeine (EXCEDRIN MIGRAINE) 250-250-65 MG tablet Take 2 tablets by mouth every 6 (six) hours as needed for headache.     Azelastine HCl 0.15 % SOLN Use 1 to 2 sprays each nostril twice a day as needed for runny nose/drainage down throat 30 mL 2   dicyclomine (BENTYL) 20 MG tablet Take by mouth.     EPIPEN 2-PAK 0.3 MG/0.3ML SOAJ injection Inject 0.3 mg into the muscle as needed for anaphylaxis. 1 each 1   famotidine (PEPCID) 20 MG tablet Take by mouth.     mupirocin ointment (BACTROBAN) 2 % Apply 1 application topically 3 (three) times daily as needed (rash).     paragard intrauterine copper IUD IUD by Intrauterine route.     PREVIDENT 0.2 % SOLN Take by mouth.     rizatriptan (MAXALT) 10 MG tablet Take 10 mg by mouth as directed. Take 10mg  at onset of headache. Repeat in 2 hours if needed. Max 2 doses in 24 hours     tobramycin-dexamethasone (TOBRADEX) ophthalmic solution Place into the right eye.     albuterol (PROVENTIL) (2.5 MG/3ML) 0.083% nebulizer solution Take 3 mLs by nebulization every 4 (four) hours as needed for wheezing or shortness of breath. 75 mL 1   albuterol (VENTOLIN HFA) 108 (90 Base) MCG/ACT inhaler Inhale 2 puffs into the lungs every 4 (four) hours as needed for wheezing or shortness of breath. 18 g  1   cetirizine (ZYRTEC) 10 MG tablet Take 1 tablet (10 mg total) by mouth daily. 30 tablet 5   fluticasone (FLONASE) 50 MCG/ACT nasal spray Place 2 sprays in each nostril once a day as needed for stuffy nose 16 g 5   montelukast (SINGULAIR) 10 MG tablet Take 1 tablet (10 mg total) by mouth at bedtime. 90 tablet 1   Olopatadine HCl 0.2 % SOLN Apply 1 drop to eye daily as needed. Apply 1 drop each eye once a day as needed for itchy watery eyes. 2.5 mL 5   omeprazole (PRILOSEC) 40 MG capsule TAKE 1 CAPSULE BY MOUTH EVERY DAY 30 MINUTES BEFORE FIRST MEAL. 30 capsule 5   SYMBICORT 160-4.5 MCG/ACT inhaler 2 puffs 1-2 times a day  with spacer to help prevent cough and wheeze. Rinse mouth after use. 10.2 g 5   No current facility-administered medications for this visit.     Known medication allergies: Allergies  Allergen Reactions   Shellfish Allergy Hives and Shortness Of Breath   Bee Pollen     Other reaction(s): Other Nasal congestion  Nasal congestion       Physical examination: Blood pressure 128/76, pulse 96, temperature 97.6 F (36.4 C), resp. rate 18, height 5\' 5"  (1.651 m), weight 245 lb (111.1 kg), SpO2 98%, unknown if currently breastfeeding.  General: Alert, interactive, in no acute distress. HEENT: PERRLA, TMs pearly gray, turbinates non-edematous without discharge, post-pharynx non erythematous. Neck: Supple without lymphadenopathy. Lungs: Clear to auscultation without wheezing, rhonchi or rales. {no increased work of breathing. CV: Normal S1, S2 without murmurs. Abdomen: Nondistended, nontender. Skin: Warm and dry, without lesions or rashes. Extremities:  No clubbing, cyanosis or edema. Neuro:   Grossly intact.  Diagnositics/Labs:  Spirometry: FEV1: 2.54L 92%, FVC: 3.17L 96%, ratio consistent with nonobstructive pattern  Assessment and plan: Acute urticaria - doing well without outbreaks Use cetirizine (Zyrtec) 10 mg to 1-2 times a day if having active hives  Continue Singulair 10 mg as below If your symptoms re-occur, begin a journal of events that occurred for up to 6 hours before your symptoms began including foods and beverages consumed, soaps or perfumes you had contact with, and medications.   Asthma - under good control at this time Continue Symbicort 160/4.5 mcg- 2 puffs 1-2 times a day with spacer pending symptoms Continue Singulair 10 mg once a day to help prevent cough and wheeze. May use Ventolin 2 puffs every 4 hours as needed for coughing, wheezing, tightness in chest or shortness of breath. You may use albuterol 2 puffs 5-15 minutes before activity to decrease cough or  wheeze Asthma control goals:  Full participation in all desired activities (may need albuterol before activity) Albuterol use two time or less a week on average (not counting use with activity) Cough interfering with sleep two time or less a month Oral steroids no more than once a year No hospitalizations  Reflux Continue omeprazole 40 mg once a day as needed.  Continue famotidine 20 mg once a day as needed. Continue to follow up with GI  Seasonal and perennial allergic rhinitis (grass pollen weed pollen, ragweed, tree, mold, cat, dog, dust mite,and cockroach) Continue Zyrtec 10mg  daily.  May take additional dose if needed Continue Flonase nasal spray 1 to 2 sprays per nostril once a day as needed for nasal congestion. Continue with Singulair 10 mg once a day as above.   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 In case of an allergic reaction, give Benadryl 4 teaspoonfuls every 4 hours, and if life-threatening symptoms occur, inject with EpiPen 0.3 mg  Follow-up appointment in 6 months or sooner if needed  I appreciate the opportunity to take part in Chiyoko's care. Please do not hesitate to contact me with questions.  Sincerely,   Margo Aye, MD Allergy/Immunology [facility]

## 2023-01-31 IMAGING — US US CAROTID DUPLEX BILAT
1 series · 13 of 24 positions shown · non-contrast
Comparison: None.

CLINICAL DATA: 34-year-old female with right.  Tinnitus.

EXAM:
BILATERAL CAROTID DUPLEX ULTRASOUND
TECHNIQUE: Gray scale imaging, color Doppler and duplex ultrasound were
performed of bilateral carotid and vertebral arteries in the neck.

[Series 1: us carotid duplex bilat · 0.06mm/px · 13 of 61 slices shown]
[im 1/61]
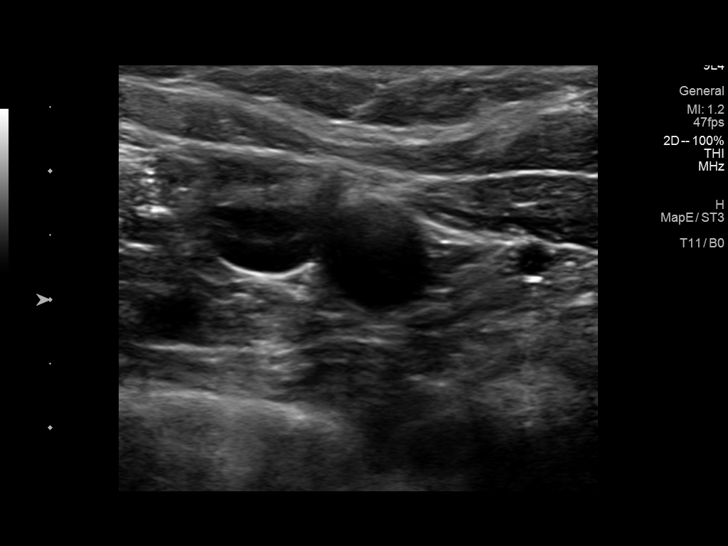
[im 6/61]
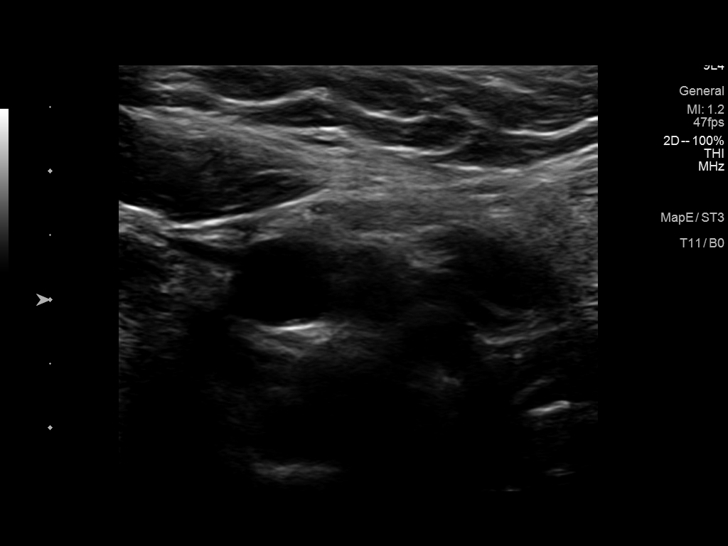
[im 11/61]
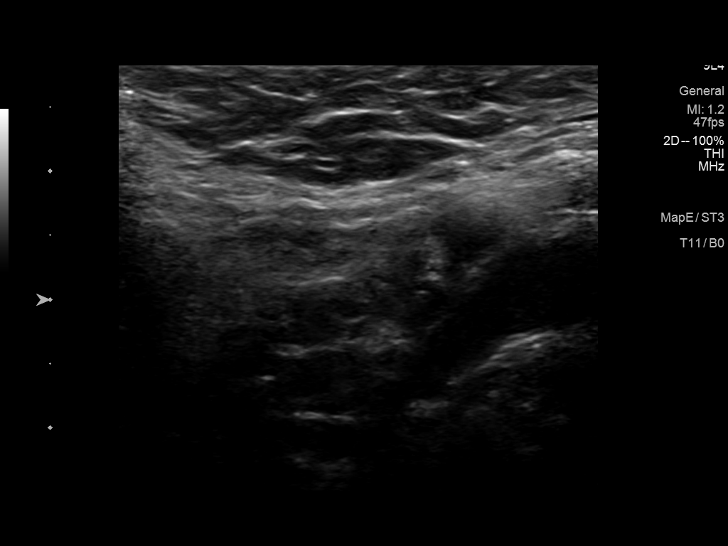
[im 16/61]
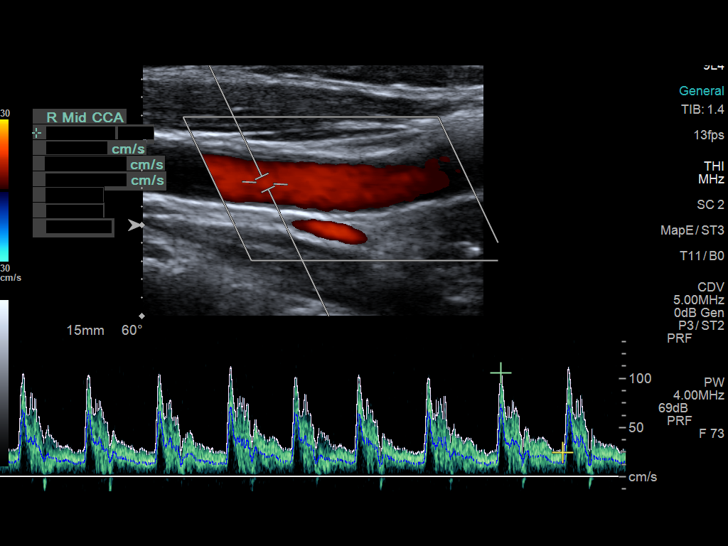
[im 21/61]
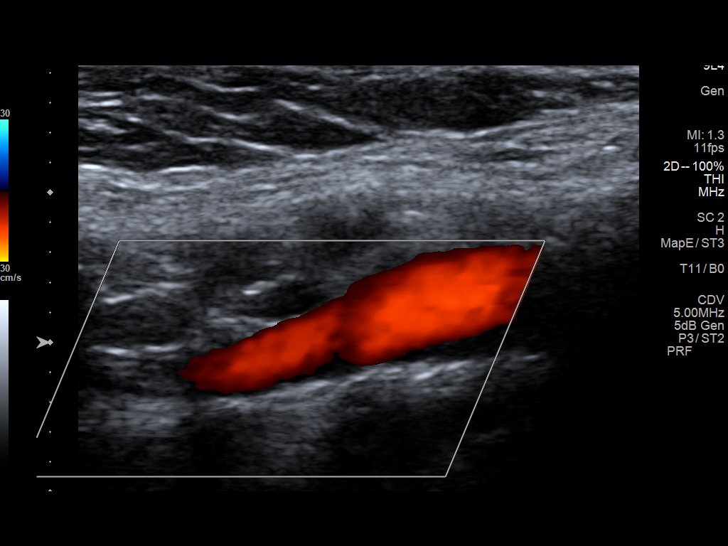
[im 27/61]
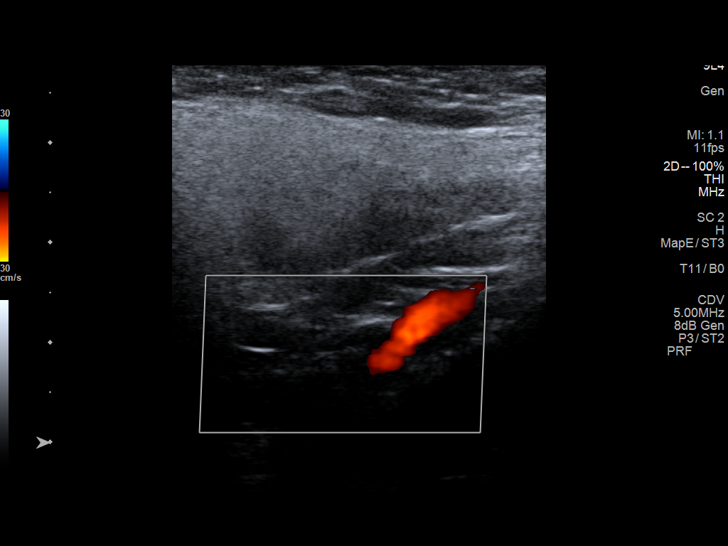
[im 32/61]
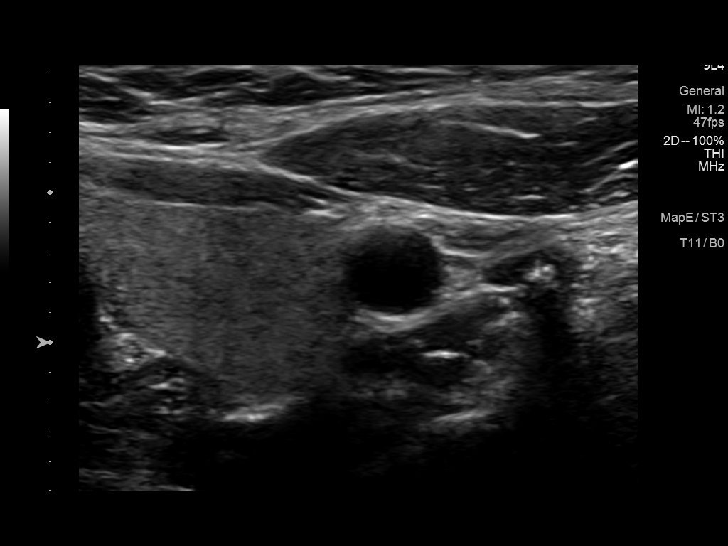
[im 34/61]
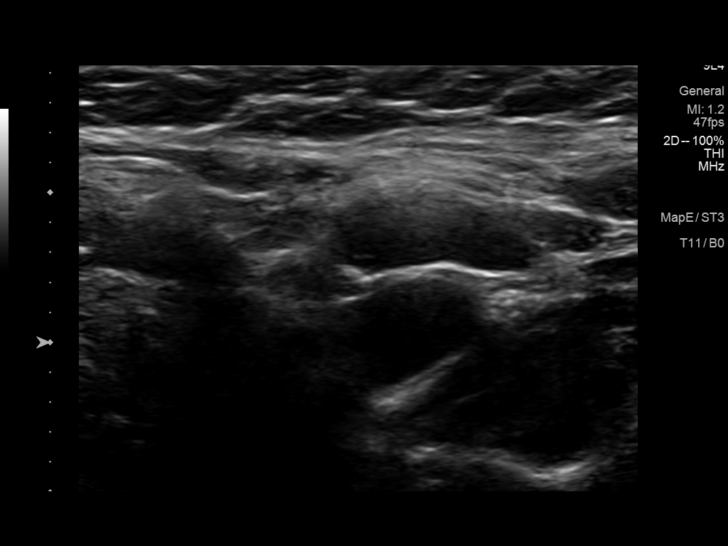
[im 40/61]
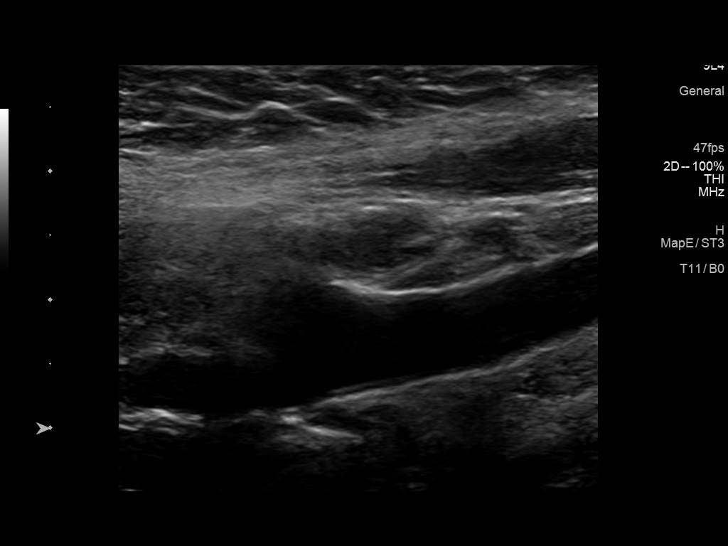
[im 45/61]
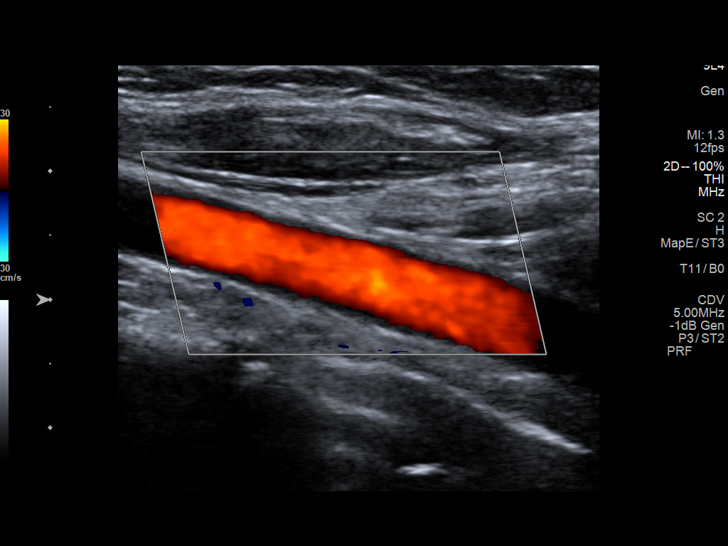
[im 50/61]
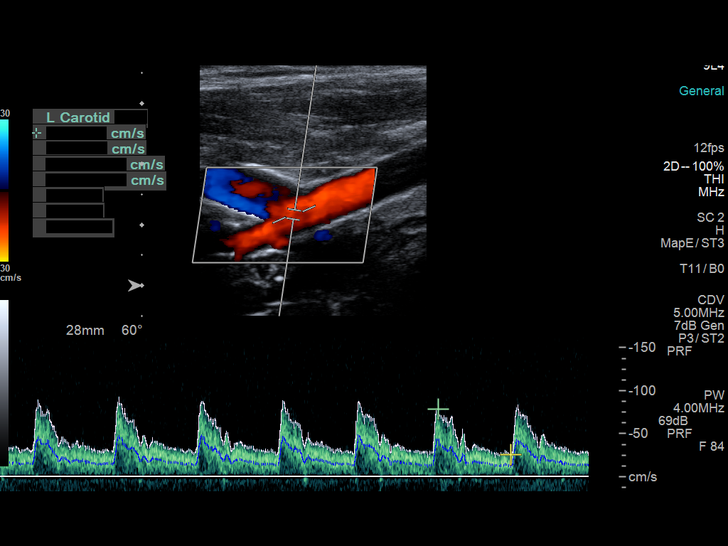
[im 55/61]
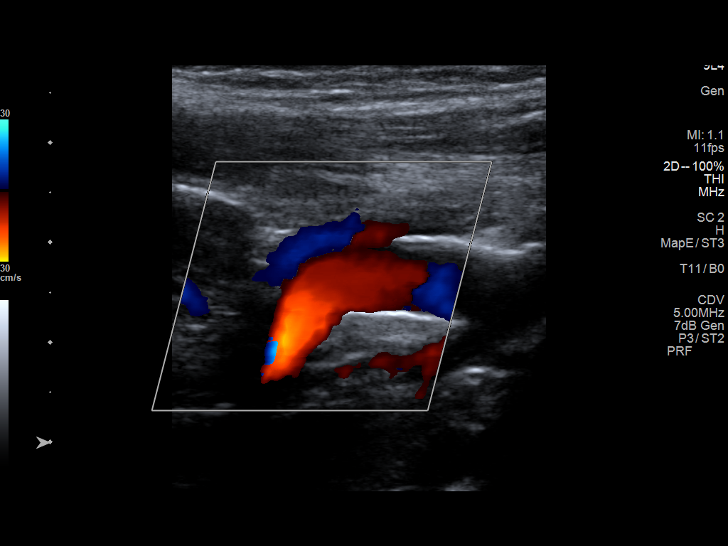
[im 61/61]
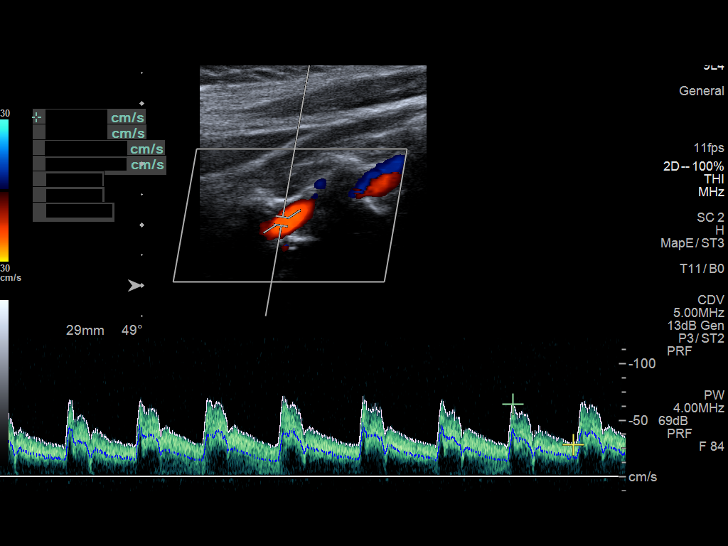

[13 of 24 positions shown; findings below may reference images not displayed]

FINDINGS: Criteria: Quantification of carotid stenosis is based on velocity
parameters that correlate the residual internal carotid diameter
with NASCET-based stenosis levels, using the diameter of the distal
internal carotid lumen as the denominator for stenosis measurement.

The following velocity measurements were obtained:

RIGHT

ICA: Peak systolic velocity 94 cm/sec, End diastolic velocity 42
cm/sec

CCA: Peak systolic velocity 106 cm/sec

SYSTOLIC ICA/CCA RATIO:  0 point

ECA: Peak systolic velocity 68 cm/sec

LEFT

ICA: Peak systolic velocity 89 cm/sec, End diastolic velocity 39
cm/sec

CCA: 122 cm/sec

SYSTOLIC ICA/CCA RATIO:

ECA: 67 cm/sec

RIGHT CAROTID ARTERY: No atherosclerotic plaque formation. No
significant tortuosity. Normal low resistance waveforms.

RIGHT VERTEBRAL ARTERY:  Antegrade flow.

LEFT CAROTID ARTERY: No atherosclerotic plaque formation. No
significant tortuosity. Normal low resistance waveforms.

LEFT VERTEBRAL ARTERY:  Antegrade flow.

Upper extremity non-invasive blood pressures:

Right: 149/79 mm/Hg

Left: 140/57 mm/Hg
IMPRESSION: 1. Right carotid artery system: Patent without significant
tortuosity or atherosclerotic plaque formation.

2. Left carotid artery system: Patent without significant tortuosity
or atherosclerotic plaque formation.

3.  Vertebral artery system: Patent with antegrade flow bilaterally.

## 2023-04-07 ENCOUNTER — Encounter: Payer: Self-pay | Admitting: Allergy

## 2023-04-07 ENCOUNTER — Ambulatory Visit: Payer: Medicaid Other | Admitting: Allergy

## 2023-04-07 VITALS — BP 118/78 | HR 88 | Temp 98.2°F | Resp 18

## 2023-04-07 DIAGNOSIS — J4540 Moderate persistent asthma, uncomplicated: Secondary | ICD-10-CM | POA: Diagnosis not present

## 2023-04-07 DIAGNOSIS — H1013 Acute atopic conjunctivitis, bilateral: Secondary | ICD-10-CM | POA: Diagnosis not present

## 2023-04-07 DIAGNOSIS — J3089 Other allergic rhinitis: Secondary | ICD-10-CM

## 2023-04-07 DIAGNOSIS — T7800XD Anaphylactic reaction due to unspecified food, subsequent encounter: Secondary | ICD-10-CM | POA: Diagnosis not present

## 2023-04-07 DIAGNOSIS — L508 Other urticaria: Secondary | ICD-10-CM

## 2023-04-07 DIAGNOSIS — K219 Gastro-esophageal reflux disease without esophagitis: Secondary | ICD-10-CM

## 2023-04-07 MED ORDER — MONTELUKAST SODIUM 10 MG PO TABS: 10.0000 mg | ORAL_TABLET | Freq: Every day | ORAL | 1 refills | Status: AC

## 2023-04-07 MED ORDER — OLOPATADINE HCL 0.2 % OP SOLN: 1.0000 [drp] | Freq: Every day | OPHTHALMIC | 5 refills | Status: AC | PRN

## 2023-04-07 MED ORDER — ALBUTEROL SULFATE HFA 108 (90 BASE) MCG/ACT IN AERS: 2.0000 | INHALATION_SPRAY | RESPIRATORY_TRACT | 1 refills | Status: AC | PRN

## 2023-04-07 MED ORDER — CETIRIZINE HCL 10 MG PO TABS: 10.0000 mg | ORAL_TABLET | Freq: Every day | ORAL | 5 refills | Status: AC

## 2023-04-07 MED ORDER — EPIPEN 2-PAK 0.3 MG/0.3ML IJ SOAJ: 0.3000 mg | INTRAMUSCULAR | 1 refills | Status: AC | PRN

## 2023-04-07 MED ORDER — SYMBICORT 160-4.5 MCG/ACT IN AERO: INHALATION_SPRAY | RESPIRATORY_TRACT | 5 refills | Status: AC

## 2023-04-07 MED ORDER — OMEPRAZOLE 40 MG PO CPDR: DELAYED_RELEASE_CAPSULE | ORAL | 5 refills | Status: AC

## 2023-04-07 NOTE — Patient Instructions (Signed)
Acute urticaria - doing well without outbreaks Use cetirizine (Zyrtec) 10 mg to 1-2 times a day if having active hives  Continue Singulair 10 mg as below If your symptoms re-occur, begin a journal of events that occurred for up to 6 hours before your symptoms began including foods and beverages consumed, soaps or perfumes you had contact with, and medications.   Asthma - under good control at this time Continue Symbicort 160/4.5 mcg- 2 puffs 1-2 times a day with spacer pending symptoms Continue Singulair 10 mg once a day to help prevent cough and wheeze. May use Ventolin 2 puffs every 4 hours as needed for coughing, wheezing, tightness in chest or shortness of breath. You may use albuterol 2 puffs 5-15 minutes before activity to decrease cough or wheeze Asthma control goals:  Full participation in all desired activities (may need albuterol before activity) Albuterol use two time or less a week on average (not counting use with activity) Cough interfering with sleep two time or less a month Oral steroids no more than once a year No hospitalizations  Reflux Continue omeprazole 40 mg once a day as needed.  Continue famotidine 20 mg once a day as needed. Continue to follow up with GI  Seasonal and perennial allergic rhinitis (grass pollen weed pollen, ragweed, tree, mold, cat, dog, dust mite,and cockroach) Continue Zyrtec 10mg  daily.  May take additional dose if needed Continue Flonase nasal spray 1 to 2 sprays per nostril once a day as needed for nasal congestion. Continue with Singulair 10 mg once a day as above.   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 In case of an allergic reaction, give Benadryl 4 teaspoonfuls every 4 hours, and if life-threatening symptoms occur, inject with EpiPen 0.3 mg   Follow-up appointment in 6  months or sooner if needed

## 2023-04-07 NOTE — Progress Notes (Unsigned)
Follow-up Note  RE: Debra Guerrero MRN: [id number] DOB: 01/02/1986 Date of Office Visit: 04/07/2023   History of present illness: Debra Guerrero is a 38 y.o. female presenting today for follow-up of hives, asthma, reflux, allergic rhinitis with conjunctivitis and food allergy.  She was last seen in the office on 10/02/22 by myself.  Discussed the use of AI scribe software for clinical note transcription with the patient, who gave verbal consent to proceed.  History of Present Illness          Review of systems: 10pt ROS negative unless noted above in HPI   All other systems negative unless noted above in HPI  Past medical/social/surgical/family history have been reviewed and are unchanged unless specifically indicated below.  No changes  Medication List: Current Outpatient Medications  Medication Sig Dispense Refill   acetaminophen (TYLENOL) 500 MG tablet Take 1,000 mg by mouth every 6 (six) hours as needed for mild pain.     albuterol (VENTOLIN HFA) 108 (90 Base) MCG/ACT inhaler Inhale 2 puffs into the lungs every 4 (four) hours as needed for wheezing or shortness of breath. 18 g 1   amphetamine-dextroamphetamine (ADDERALL XR) 20 MG 24 hr capsule Take 20 mg by mouth daily.     Azelastine HCl 0.15 % SOLN Use 1 to 2 sprays each nostril twice a day as needed for runny nose/drainage down throat 30 mL 2   cetirizine (ZYRTEC) 10 MG tablet Take 1 tablet (10 mg total) by mouth daily. 30 tablet 5   clindamycin (CLEOCIN T) 1 % external solution Apply 1 Application topically 2 (two) times daily.     dicyclomine (BENTYL) 20 MG tablet Take by mouth.     EPIPEN 2-PAK 0.3 MG/0.3ML SOAJ injection Inject 0.3 mg into the muscle as needed for anaphylaxis. 1 each 1   famotidine (PEPCID) 20 MG tablet Take by mouth.     fluticasone (FLONASE) 50 MCG/ACT nasal spray Place 2 sprays in each nostril once a day as needed for stuffy nose 16 g 5   montelukast (SINGULAIR) 10 MG tablet Take 1  tablet (10 mg total) by mouth at bedtime. 90 tablet 1   Olopatadine HCl 0.2 % SOLN Apply 1 drop to eye daily as needed. Apply 1 drop each eye once a day as needed for itchy watery eyes. 2.5 mL 5   omeprazole (PRILOSEC) 40 MG capsule TAKE 1 CAPSULE BY MOUTH EVERY DAY 30 MINUTES BEFORE FIRST MEAL. 30 capsule 5   ondansetron (ZOFRAN) 4 MG tablet Take 4 mg by mouth once.     paragard intrauterine copper IUD IUD by Intrauterine route.     rizatriptan (MAXALT) 10 MG tablet Take 10 mg by mouth as directed. Take 10mg  at onset of headache. Repeat in 2 hours if needed. Max 2 doses in 24 hours     SYMBICORT 160-4.5 MCG/ACT inhaler 2 puffs 1-2 times a day with spacer to help prevent cough and wheeze. Rinse mouth after use. 10.2 g 5   WEGOVY 1.7 MG/0.75ML SOAJ Inject 1.7 mg into the skin every 7 (seven) days.     albuterol (PROVENTIL) (2.5 MG/3ML) 0.083% nebulizer solution Take 3 mLs by nebulization every 4 (four) hours as needed for wheezing or shortness of breath. 75 mL 1   No current facility-administered medications for this visit.     Known medication allergies: Allergies  Allergen Reactions   Shellfish Allergy Hives and Shortness Of Breath   Bee Pollen     Other reaction(s):  Other Nasal congestion  Nasal congestion       Physical examination: Blood pressure 118/78, pulse 88, temperature 98.2 F (36.8 C), temperature source Temporal, resp. rate 18, SpO2 100%, unknown if currently breastfeeding.  General: Alert, interactive, in no acute distress. HEENT: PERRLA, TMs pearly gray, turbinates non-edematous without discharge, post-pharynx non erythematous. Neck: Supple without lymphadenopathy. Lungs: Clear to auscultation without wheezing, rhonchi or rales. {no increased work of breathing. CV: Normal S1, S2 without murmurs. Abdomen: Nondistended, nontender. Skin: Warm and dry, without lesions or rashes. Extremities:  No clubbing, cyanosis or edema. Neuro:   Grossly  intact.  Diagnositics/Labs:  Spirometry: {Blank single:19197::"results normal","FEV1: ***, FVC: ***, ratio consistent with ***"}  Assessment and plan: There are no Patient Instructions on file for this visit.  No follow-ups on file.  I appreciate the opportunity to take part in Taralynn's care. Please do not hesitate to contact me with questions.  Sincerely,   Margo Aye, MD Allergy/Immunology [facility]

## 2023-12-10 ENCOUNTER — Ambulatory Visit: Admitting: Plastic Surgery

## 2023-12-10 VITALS — BP 114/82 | HR 104 | Ht 65.0 in | Wt 193.4 lb

## 2023-12-10 DIAGNOSIS — N62 Hypertrophy of breast: Secondary | ICD-10-CM

## 2023-12-10 DIAGNOSIS — M546 Pain in thoracic spine: Secondary | ICD-10-CM | POA: Diagnosis not present

## 2023-12-10 DIAGNOSIS — G8929 Other chronic pain: Secondary | ICD-10-CM

## 2023-12-10 DIAGNOSIS — J4540 Moderate persistent asthma, uncomplicated: Secondary | ICD-10-CM

## 2023-12-10 DIAGNOSIS — F202 Catatonic schizophrenia: Secondary | ICD-10-CM

## 2023-12-10 DIAGNOSIS — M542 Cervicalgia: Secondary | ICD-10-CM | POA: Diagnosis not present

## 2023-12-10 DIAGNOSIS — Z6832 Body mass index (BMI) 32.0-32.9, adult: Secondary | ICD-10-CM | POA: Diagnosis not present

## 2023-12-10 DIAGNOSIS — M549 Dorsalgia, unspecified: Secondary | ICD-10-CM | POA: Insufficient documentation

## 2023-12-10 NOTE — Progress Notes (Signed)
 Patient ID: Debra Guerrero, female    DOB: 1985-03-30, 38 y.o.   MRN: [id number]   Chief Complaint  Patient presents with   Breast Problem    Mammary Hyperplasia: The patient is a 38 y.o. female with a history of mammary hyperplasia for several years.  She has extremely large breasts causing symptoms that include the following: Back pain in the upper and lower back, including neck pain. She pulls or pins her bra straps to provide better lift and relief of the pressure and pain. She notices relief by holding her breast up manually.  Her shoulder straps cause grooves and pain and pressure that requires padding for relief. Pain medication is sometimes required with motrin  and tylenol .  Activities that are hindered by enlarged breasts include: exercise and running.  She has tried supportive clothing as well as fitted bras without improvement.  Her breasts are extremely large and fairly symmetric.  She has hyperpigmentation of the inframammary area on both sides.  The sternal to nipple distance on the right is 38 cm and the left is 38 cm.  The IMF distance is 19 cm.  She is 5 feet 5 inches tall and weighs 193 pounds.  The BMI = 32.1 kg/m.  Preoperative bra size = H/I cup. Would like to be a D cup. The estimated excess breast tissue to be removed at the time of surgery = 550-600 grams on the left and 550-600 grams on the right.  Mammogram history: none.  Family history of breast cancer:  none.  Tobacco use:  none.   The patient expresses the desire to pursue surgical intervention.     Review of Systems  Constitutional:  Positive for activity change. Negative for appetite change.  Eyes: Negative.   Respiratory: Negative.    Cardiovascular: Negative.   Gastrointestinal: Negative.   Endocrine: Negative.   Genitourinary: Negative.   Musculoskeletal:  Positive for back pain and neck pain.  Skin:  Positive for rash.    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   Seasonal allergies 09/11/2011   Urticaria     Past Surgical History:  Procedure Laterality Date   NO PAST SURGERIES        Current Outpatient Medications:    acetaminophen  (TYLENOL ) 500 MG tablet, Take 1,000 mg by mouth every 6 (six) hours as needed for mild pain., Disp: , Rfl:    albuterol  (PROVENTIL ) (2.5 MG/3ML) 0.083% nebulizer solution, Take 3 mLs by nebulization every 4 (four) hours as needed for wheezing or shortness of breath., Disp: 75 mL, Rfl: 1   albuterol  (VENTOLIN  HFA) 108 (90 Base) MCG/ACT inhaler, Inhale 2 puffs into the lungs every 4 (four) hours as needed for wheezing or shortness of breath., Disp: 18 g, Rfl: 1   amphetamine-dextroamphetamine (ADDERALL XR) 20 MG 24 hr capsule, Take 20 mg by mouth daily., Disp: , Rfl:    Azelastine  HCl 0.15 % SOLN, Use 1 to 2 sprays each nostril twice a day as needed for runny nose/drainage down throat, Disp: 30 mL, Rfl: 2   cetirizine  (ZYRTEC ) 10 MG tablet, Take 1 tablet (10 mg total) by mouth daily., Disp: 30 tablet, Rfl: 5   clindamycin (CLEOCIN T) 1 % external solution, Apply 1 Application topically 2 (two) times daily., Disp: , Rfl:    dicyclomine (BENTYL) 20 MG tablet, Take by  mouth., Disp: , Rfl:    EPIPEN  2-PAK 0.3 MG/0.3ML SOAJ injection, Inject 0.3 mg into the muscle as needed for anaphylaxis., Disp: 1 each, Rfl: 1   famotidine  (PEPCID ) 20 MG tablet, Take by mouth., Disp: , Rfl:    fluticasone  (FLONASE ) 50 MCG/ACT nasal spray, Place 2 sprays in each nostril once a day as needed for stuffy nose, Disp: 16 g, Rfl: 5   montelukast  (SINGULAIR ) 10 MG tablet, Take 1 tablet (10 mg total) by mouth at bedtime., Disp: 90 tablet, Rfl: 1   Olopatadine  HCl 0.2 % SOLN, Apply 1 drop to eye daily as needed. Apply 1 drop each eye once a day as needed for itchy watery eyes., Disp: 2.5 mL, Rfl: 5   omeprazole  (PRILOSEC) 40 MG capsule, TAKE 1 CAPSULE BY MOUTH EVERY  DAY 30 MINUTES BEFORE FIRST MEAL., Disp: 30 capsule, Rfl: 5   ondansetron  (ZOFRAN ) 4 MG tablet, Take 4 mg by mouth once., Disp: , Rfl:    paragard intrauterine copper IUD IUD, by Intrauterine route., Disp: , Rfl:    rizatriptan (MAXALT) 10 MG tablet, Take 10 mg by mouth as directed. Take 10mg  at onset of headache. Repeat in 2 hours if needed. Max 2 doses in 24 hours, Disp: , Rfl:    SYMBICORT  160-4.5 MCG/ACT inhaler, 2 puffs 1-2 times a day with spacer to help prevent cough and wheeze. Rinse mouth after use., Disp: 10.2 g, Rfl: 5   Objective:   Vitals:   12/10/23 0904  BP: 114/82  Pulse: (!) 104  SpO2: 98%    Physical Exam Vitals reviewed.  Constitutional:      Appearance: Normal appearance.  HENT:     Head: Atraumatic.  Cardiovascular:     Rate and Rhythm: Normal rate.     Pulses: Normal pulses.  Pulmonary:     Effort: Pulmonary effort is normal.  Abdominal:     General: There is no distension.     Palpations: Abdomen is soft.     Tenderness: There is no abdominal tenderness.  Skin:    General: Skin is warm.     Capillary Refill: Capillary refill takes less than 2 seconds.  Neurological:     Mental Status: She is alert and oriented to person, place, and time.  Psychiatric:        Mood and Affect: Mood normal.        Behavior: Behavior normal.        Thought Content: Thought content normal.        Judgment: Judgment normal.     Assessment & Plan:  Chronic bilateral thoracic back pain  Symptomatic mammary hypertrophy  Schizophrenia, catatonic (HCC)  Moderate persistent asthma without complication  The procedure the patient selected and that was best for the patient was discussed. The risk were discussed and include but not limited to the following:  Breast asymmetry, fluid accumulation, firmness of the breast, inability to breast feed, loss of nipple or areola, skin loss, change in skin and nipple sensation, fat necrosis of the breast tissue, bleeding, infection and  healing delay.  There are risks of anesthesia and injury to nerves or blood vessels.  Allergic reaction to tape, suture and skin glue are possible.  There will be swelling.  Any of these can lead to the need for revisional surgery which is not included in this surgery.  A breast reduction has potential to interfere with diagnostic procedures in the future.  This procedure is best done when the breast is fully  developed.  Changes in the breast will continue to occur over time: pregnancy, weight gain or weigh loss. No guarantees are given for a certain bra or breast size.    Total time: 40 minutes. This includes time spent with the patient during the visit as well as time spent before and after the visit reviewing the chart, documenting the encounter, ordering pertinent studies and literature for the patient.   Physical therapy:  not required Mammogram:  not required  The patient is a candidate for bilateral breast reduction with liposuction.  She understands that she may not be able to breast-feed in the future.  Patient stated she was not planning on having anymore kids.  I cannot guarantee her bra size or a breast size but did share with her the amount that insurance requires to have removed to be authorized.  Pictures were obtained of the patient and placed in the chart with the patient's or guardian's permission.   Estefana RAMAN Cherina Dhillon, DO

## 2023-12-21 ENCOUNTER — Encounter: Payer: Self-pay | Admitting: *Deleted

## 2023-12-25 ENCOUNTER — Other Ambulatory Visit: Payer: Self-pay | Admitting: Allergy

## 2024-03-09 ENCOUNTER — Other Ambulatory Visit: Payer: Self-pay | Admitting: Allergy

## 2024-03-10 ENCOUNTER — Other Ambulatory Visit (HOSPITAL_BASED_OUTPATIENT_CLINIC_OR_DEPARTMENT_OTHER): Payer: Self-pay

## 2024-03-10 ENCOUNTER — Other Ambulatory Visit: Payer: Self-pay

## 2024-03-10 MED ORDER — AMPHETAMINE-DEXTROAMPHET ER 10 MG PO CP24: 10.0000 mg | ORAL_CAPSULE | Freq: Every morning | ORAL | 0 refills | Status: AC

## 2024-03-10 MED ORDER — AMPHETAMINE-DEXTROAMPHET ER 10 MG PO CP24
10.0000 mg | ORAL_CAPSULE | Freq: Every day | ORAL | 0 refills | Status: AC
  Filled 2024-03-10: qty 30, 30d supply, fill #0

## 2024-03-10 MED ORDER — AMPHETAMINE-DEXTROAMPHET ER 10 MG PO CP24: 10.0000 mg | ORAL_CAPSULE | Freq: Every day | ORAL | 0 refills | Status: AC

## 2024-03-14 ENCOUNTER — Other Ambulatory Visit (HOSPITAL_BASED_OUTPATIENT_CLINIC_OR_DEPARTMENT_OTHER): Payer: Self-pay

## 2024-03-23 ENCOUNTER — Other Ambulatory Visit (HOSPITAL_BASED_OUTPATIENT_CLINIC_OR_DEPARTMENT_OTHER): Payer: Self-pay

## 2024-03-23 ENCOUNTER — Emergency Department (HOSPITAL_BASED_OUTPATIENT_CLINIC_OR_DEPARTMENT_OTHER)
Admission: EM | Admit: 2024-03-23 | Discharge: 2024-03-23 | Disposition: A | Discharge status: Home / Self Care | Attending: Emergency Medicine | Admitting: Emergency Medicine

## 2024-03-23 ENCOUNTER — Other Ambulatory Visit: Payer: Self-pay

## 2024-03-23 ENCOUNTER — Encounter (HOSPITAL_BASED_OUTPATIENT_CLINIC_OR_DEPARTMENT_OTHER): Payer: Self-pay

## 2024-03-23 DIAGNOSIS — R197 Diarrhea, unspecified: Secondary | ICD-10-CM | POA: Insufficient documentation

## 2024-03-23 DIAGNOSIS — R112 Nausea with vomiting, unspecified: Secondary | ICD-10-CM | POA: Insufficient documentation

## 2024-03-23 DIAGNOSIS — R1084 Generalized abdominal pain: Secondary | ICD-10-CM | POA: Diagnosis not present

## 2024-03-23 LAB — RESP PANEL BY RT-PCR (RSV, FLU A&B, COVID)  RVPGX2
Influenza A by PCR: NEGATIVE
Influenza B by PCR: NEGATIVE
Resp Syncytial Virus by PCR: NEGATIVE
SARS Coronavirus 2 by RT PCR: NEGATIVE

## 2024-03-23 MED ORDER — ONDANSETRON HCL 4 MG/2ML IJ SOLN
4.0000 mg | Freq: Once | INTRAMUSCULAR | Status: AC
  Administered 2024-03-23: 4 mg via INTRAVENOUS
  Filled 2024-03-23: qty 2

## 2024-03-23 MED ORDER — SODIUM CHLORIDE 0.9 % IV BOLUS
500.0000 mL | Freq: Once | INTRAVENOUS | Status: AC
  Administered 2024-03-23: 500 mL via INTRAVENOUS

## 2024-03-23 MED ORDER — ONDANSETRON 4 MG PO TBDP
4.0000 mg | ORAL_TABLET | Freq: Three times a day (TID) | ORAL | 0 refills | Status: AC | PRN
  Filled 2024-03-23: qty 20, 7d supply, fill #0

## 2024-03-23 NOTE — ED Notes (Signed)
 Reviewed discharge instructions, follow up and medications with pt. States understanding Ambulatory at discharge

## 2024-03-23 NOTE — ED Provider Notes (Signed)
 " [facility] Provider Note   CSN: [id number] Arrival date & time: 03/23/24  9051     Patient presents with: Abdominal Pain   Debra Guerrero is a 39 y.o. female.    Abdominal Pain Associated symptoms: diarrhea, nausea and vomiting   Associated symptoms: no constipation   39 year old female presenting with diarrhea and vomiting.  Patient reports that this has been going on since about 1 AM.  She has had multiple bouts of diarrhea and vomiting.  But no blood in either one of the she believes that this may be few poisoning from eating at [employer].     Prior to Admission medications  Medication Sig Start Date End Date Taking? Authorizing Provider  clindamycin (CLINDAGEL) 1 % gel Apply 1 Application topically 2 (two) times daily. 03/03/24  Yes [provider]  ondansetron  (ZOFRAN -ODT) 4 MG disintegrating tablet Take 1 tablet (4 mg total) by mouth every 8 (eight) hours as needed for nausea or vomiting. 03/23/24  Yes Rosaline Almarie MATSU, PA-C  acetaminophen  (TYLENOL ) 500 MG tablet Take 1,000 mg by mouth every 6 (six) hours as needed for mild pain.    [provider]  albuterol  (PROVENTIL ) (2.5 MG/3ML) 0.083% nebulizer solution Take 3 mLs by nebulization every 4 (four) hours as needed for wheezing or shortness of breath. 04/02/22 05/02/22  Jeneal Danita Macintosh, MD  albuterol  (VENTOLIN  HFA) 108 (90 Base) MCG/ACT inhaler Inhale 2 puffs into the lungs every 4 (four) hours as needed for wheezing or shortness of breath. 04/07/23   Padgett, Danita Macintosh, MD  amphetamine -dextroamphetamine  (ADDERALL XR) 10 MG 24 hr capsule Take one capsule (10 mg dose) by mouth daily. Max Daily Amount: 10 mg 05/09/24     amphetamine -dextroamphetamine  (ADDERALL XR) 10 MG 24 hr capsule Take one capsule (10 mg dose) by mouth daily. Max Daily Amount: 10 mg 03/10/24     amphetamine -dextroamphetamine  (ADDERALL XR) 10 MG 24 hr capsule Take one capsule  (10 mg dose) by mouth every morning. Max Daily Amount: 10 mg 04/09/24     amphetamine -dextroamphetamine  (ADDERALL XR) 20 MG 24 hr capsule Take 20 mg by mouth daily. 04/07/23   [provider]  Azelastine  HCl 0.15 % SOLN Use 1 to 2 sprays each nostril twice a day as needed for runny nose/drainage down throat 06/10/20   Cheryl Reusing, FNP  cetirizine  (ZYRTEC ) 10 MG tablet Take 1 tablet (10 mg total) by mouth daily. 04/07/23   Jeneal Danita Macintosh, MD  clindamycin (CLEOCIN T) 1 % external solution Apply 1 Application topically 2 (two) times daily. 12/01/22   [provider]  dicyclomine (BENTYL) 20 MG tablet Take by mouth. 09/12/21   [provider]  EPIPEN  2-PAK 0.3 MG/0.3ML SOAJ injection Inject 0.3 mg into the muscle as needed for anaphylaxis. 04/07/23   Jeneal Danita Macintosh, MD  famotidine  (PEPCID ) 20 MG tablet Take by mouth. 12/09/20   [provider]  fluticasone  (FLONASE ) 50 MCG/ACT nasal spray Place 2 sprays in each nostril once a day as needed for stuffy nose 10/02/22   Jeneal Danita Macintosh, MD  montelukast  (SINGULAIR ) 10 MG tablet Take 1 tablet (10 mg total) by mouth at bedtime. 04/07/23   Jeneal Danita Macintosh, MD  Olopatadine  HCl 0.2 % SOLN Apply 1 drop to eye daily as needed. Apply 1 drop each eye once a day as needed for itchy watery eyes. 04/07/23   Jeneal Danita Macintosh, MD  omeprazole  (PRILOSEC) 40 MG capsule TAKE 1 CAPSULE  BY MOUTH EVERY DAY 30 MINUTES BEFORE FIRST MEAL. 04/07/23   Jeneal Danita Macintosh, MD  ondansetron  (ZOFRAN ) 4 MG tablet Take 4 mg by mouth once. 01/13/23   [provider]  paragard intrauterine copper IUD IUD by Intrauterine route.    [provider]  rizatriptan (MAXALT) 10 MG tablet Take 10 mg by mouth as directed. Take 10mg  at onset of headache. Repeat in 2 hours if needed. Max 2 doses in 24 hours 08/04/19   [provider]  SYMBICORT  160-4.5 MCG/ACT inhaler 2 puffs 1-2 times a day with  spacer to help prevent cough and wheeze. Rinse mouth after use. 04/07/23   Jeneal Danita Macintosh, MD    Allergies: Shellfish allergy and Bee pollen    Review of Systems  Gastrointestinal:  Positive for abdominal pain, diarrhea, nausea and vomiting. Negative for blood in stool and constipation.  All other systems reviewed and are negative.   Updated Vital Signs BP 115/78 (BP Location: Left Arm)   Pulse 96   Temp 100.2 F (37.9 C) (Oral)   Resp 16   Ht 5' 5 (1.651 m)   Wt 83.9 kg   LMP 03/04/2024 (Exact Date)   SpO2 100%   BMI 30.79 kg/m   Physical Exam Vitals and nursing note reviewed.  HENT:     Mouth/Throat:     Mouth: Mucous membranes are moist.     Pharynx: Oropharynx is clear.  Cardiovascular:     Rate and Rhythm: Normal rate and regular rhythm.     Pulses: Normal pulses.     Heart sounds: Normal heart sounds.  Pulmonary:     Effort: Pulmonary effort is normal.     Breath sounds: Normal breath sounds.  Abdominal:     General: Abdomen is flat. Bowel sounds are normal.     Palpations: Abdomen is soft.     Tenderness: There is generalized abdominal tenderness. There is no guarding or rebound. Negative signs include Murphy's sign, Rovsing's sign and McBurney's sign.  Skin:    General: Skin is warm and dry.  Neurological:     General: No focal deficit present.     Mental Status: She is alert.     (all labs ordered are listed, but only abnormal results are displayed) Labs Reviewed  RESP PANEL BY RT-PCR (RSV, FLU A&B, COVID)  [id number]  STOOL CULTURE    EKG: None  Radiology: No results found.   Procedures   Medications Ordered in the ED  sodium chloride  0.9 % bolus 500 mL (500 mLs Intravenous New Bag/Given 03/23/24 1149)  ondansetron  (ZOFRAN ) injection 4 mg (4 mg Intravenous Given 03/23/24 1144)                                    Medical Decision Making Amount and/or Complexity of Data Reviewed Labs: ordered.  Risk Prescription drug  management.   Impression: 39 year old female presenting with vomiting and diarrhea.  Differential diagnoses include gastroenteritis, colitis, enteritis, diverticulitis  Additional History: Patient was able to provide history.  I also reviewed other outpatient notes.  Labs: Stool culture was sent out  Imaging: None  ED Course/Meds: 39 year old female presenting with vomiting and diarrhea.  Patient appears well and in no acute distress.  Patient reportedly started vomiting and having diarrhea at about 1 AM parents believes that it might be something that she ate.  Physical exam was unremarkable.  Her respiratory panel was negative.  Likely cause of this was foodborne illness.  Patient was given Zofran  and 500 fluid and reports feeling much better.  Patient requested to have a stool culture sent out to know if it was food poisoning.  Patient was discharged with a prescription for Zofran  to help with any other nausea she may be having.  Educated on signs and symptoms of when to return to the ER.  Patient verbally agreed to the above plan.  All questions were answered.  Patient was stable while in the ER and discharge.      Final diagnoses:  Nausea and vomiting, unspecified vomiting type    ED Discharge Orders          Ordered    ondansetron  (ZOFRAN -ODT) 4 MG disintegrating tablet  Every 8 hours PRN        03/23/24 1425               Rosaline Almarie MATSU, [place] 03/23/24 1439  "

## 2024-03-23 NOTE — ED Triage Notes (Addendum)
 Pt reports that she has been vomiting since last night. Thinks that she may have food poisoning. Reports that she had some pizza with ricotta cheese and began vomiting.  Reports that she has vomited x 3 and had some diarrhea.

## 2024-03-23 NOTE — Discharge Instructions (Signed)
 I have sent in some medication to the pharmacy here at [facility].  You may pick that up at your discharge.  I recommend staying hydrated and eating a bland diet.  If your current symptoms continue follow-up with your primary care.  If your symptoms worsen and you start to notice any blood in your vomit or diarrhea or uncontrollable vomiting please return to the ER.

## 2024-04-05 ENCOUNTER — Other Ambulatory Visit (HOSPITAL_BASED_OUTPATIENT_CLINIC_OR_DEPARTMENT_OTHER): Payer: Self-pay
